# Patient Record
Sex: Female | Born: 1950 | Race: Black or African American | Hispanic: No | Smoking: Current some day smoker
Health system: Southern US, Community
[De-identification: ages and names within clinical notes are randomized; demographics above are authoritative.]

## PROBLEM LIST (undated history)

## (undated) DIAGNOSIS — I340 Nonrheumatic mitral (valve) insufficiency: Secondary | ICD-10-CM

## (undated) DIAGNOSIS — I219 Acute myocardial infarction, unspecified: Secondary | ICD-10-CM

---

## 1998-09-14 ENCOUNTER — Other Ambulatory Visit: Admission: RE | Admit: 1998-09-14 | Discharge: 1998-09-14 | Payer: Self-pay | Admitting: *Deleted

## 1998-12-23 ENCOUNTER — Other Ambulatory Visit: Admission: RE | Admit: 1998-12-23 | Discharge: 1998-12-23 | Payer: Self-pay | Admitting: *Deleted

## 1998-12-23 ENCOUNTER — Encounter (INDEPENDENT_AMBULATORY_CARE_PROVIDER_SITE_OTHER): Payer: Self-pay | Admitting: Specialist

## 2000-09-05 ENCOUNTER — Other Ambulatory Visit: Admission: RE | Admit: 2000-09-05 | Discharge: 2000-09-05 | Payer: Self-pay | Admitting: *Deleted

## 2001-01-06 ENCOUNTER — Other Ambulatory Visit: Admission: RE | Admit: 2001-01-06 | Discharge: 2001-01-06 | Payer: Self-pay | Admitting: Obstetrics and Gynecology

## 2001-01-24 ENCOUNTER — Other Ambulatory Visit: Admission: RE | Admit: 2001-01-24 | Discharge: 2001-01-24 | Payer: Self-pay | Admitting: Obstetrics and Gynecology

## 2001-02-07 ENCOUNTER — Encounter: Payer: Self-pay | Admitting: Obstetrics and Gynecology

## 2001-02-07 ENCOUNTER — Ambulatory Visit (HOSPITAL_COMMUNITY): Admission: RE | Admit: 2001-02-07 | Discharge: 2001-02-07 | Payer: Self-pay | Admitting: Obstetrics and Gynecology

## 2011-09-09 ENCOUNTER — Emergency Department (HOSPITAL_COMMUNITY): Payer: BC Managed Care – PPO

## 2011-09-09 ENCOUNTER — Encounter (HOSPITAL_COMMUNITY): Payer: Self-pay

## 2011-09-09 ENCOUNTER — Inpatient Hospital Stay (HOSPITAL_COMMUNITY)
Admission: EM | Admit: 2011-09-09 | Discharge: 2011-09-20 | DRG: 544 | Disposition: A | Discharge status: Home / Self Care | Payer: BC Managed Care – PPO | Source: Ambulatory Visit | Attending: Cardiovascular Disease | Admitting: Cardiovascular Disease

## 2011-09-09 DIAGNOSIS — E872 Acidosis, unspecified: Secondary | ICD-10-CM

## 2011-09-09 DIAGNOSIS — J9600 Acute respiratory failure, unspecified whether with hypoxia or hypercapnia: Secondary | ICD-10-CM | POA: Diagnosis present

## 2011-09-09 DIAGNOSIS — I469 Cardiac arrest, cause unspecified: Secondary | ICD-10-CM

## 2011-09-09 DIAGNOSIS — I959 Hypotension, unspecified: Secondary | ICD-10-CM | POA: Diagnosis present

## 2011-09-09 DIAGNOSIS — F17200 Nicotine dependence, unspecified, uncomplicated: Secondary | ICD-10-CM | POA: Diagnosis present

## 2011-09-09 DIAGNOSIS — I429 Cardiomyopathy, unspecified: Secondary | ICD-10-CM | POA: Diagnosis present

## 2011-09-09 DIAGNOSIS — I340 Nonrheumatic mitral (valve) insufficiency: Secondary | ICD-10-CM

## 2011-09-09 DIAGNOSIS — N289 Disorder of kidney and ureter, unspecified: Secondary | ICD-10-CM

## 2011-09-09 DIAGNOSIS — I5020 Unspecified systolic (congestive) heart failure: Secondary | ICD-10-CM | POA: Diagnosis not present

## 2011-09-09 DIAGNOSIS — I4901 Ventricular fibrillation: Principal | ICD-10-CM

## 2011-09-09 DIAGNOSIS — Z9911 Dependence on respirator [ventilator] status: Secondary | ICD-10-CM

## 2011-09-09 DIAGNOSIS — E876 Hypokalemia: Secondary | ICD-10-CM

## 2011-09-09 DIAGNOSIS — G931 Anoxic brain damage, not elsewhere classified: Secondary | ICD-10-CM | POA: Diagnosis present

## 2011-09-09 DIAGNOSIS — I059 Rheumatic mitral valve disease, unspecified: Secondary | ICD-10-CM | POA: Diagnosis present

## 2011-09-09 DIAGNOSIS — I43 Cardiomyopathy in diseases classified elsewhere: Secondary | ICD-10-CM | POA: Diagnosis present

## 2011-09-09 DIAGNOSIS — E639 Nutritional deficiency, unspecified: Secondary | ICD-10-CM | POA: Diagnosis present

## 2011-09-09 DIAGNOSIS — R0902 Hypoxemia: Secondary | ICD-10-CM

## 2011-09-09 DIAGNOSIS — I428 Other cardiomyopathies: Secondary | ICD-10-CM | POA: Diagnosis present

## 2011-09-09 DIAGNOSIS — J810 Acute pulmonary edema: Secondary | ICD-10-CM | POA: Diagnosis not present

## 2011-09-09 DIAGNOSIS — F1410 Cocaine abuse, uncomplicated: Secondary | ICD-10-CM

## 2011-09-09 DIAGNOSIS — J9601 Acute respiratory failure with hypoxia: Secondary | ICD-10-CM

## 2011-09-09 DIAGNOSIS — F1010 Alcohol abuse, uncomplicated: Secondary | ICD-10-CM | POA: Diagnosis not present

## 2011-09-09 DIAGNOSIS — N179 Acute kidney failure, unspecified: Secondary | ICD-10-CM | POA: Diagnosis not present

## 2011-09-09 HISTORY — DX: Nonrheumatic mitral (valve) insufficiency: I34.0

## 2011-09-09 LAB — POCT I-STAT, CHEM 8
BUN: 5 mg/dL — ABNORMAL LOW (ref 6–23)
Calcium, Ion: 1.03 mmol/L — ABNORMAL LOW (ref 1.12–1.32)
Chloride: 115 mEq/L — ABNORMAL HIGH (ref 96–112)
Glucose, Bld: 195 mg/dL — ABNORMAL HIGH (ref 70–99)
HCT: 41 % (ref 36.0–46.0)
HCT: 41 % (ref 36.0–46.0)
Hemoglobin: 13.9 g/dL (ref 12.0–15.0)
Hemoglobin: 12.9 g/dL (ref 12.0–15.0)
Potassium: 2.8 mEq/L — ABNORMAL LOW (ref 3.5–5.1)
Sodium: 146 mEq/L — ABNORMAL HIGH (ref 135–145)
Sodium: 146 mEq/L — ABNORMAL HIGH (ref 135–145)
Sodium: 147 mEq/L — ABNORMAL HIGH (ref 135–145)
TCO2: 18 mmol/L (ref 0–100)

## 2011-09-09 LAB — GLUCOSE, CAPILLARY
Glucose-Capillary: 207 mg/dL — ABNORMAL HIGH (ref 70–99)
Glucose-Capillary: 168 mg/dL — ABNORMAL HIGH (ref 70–99)
Glucose-Capillary: 158 mg/dL — ABNORMAL HIGH (ref 70–99)
Glucose-Capillary: 154 mg/dL — ABNORMAL HIGH (ref 70–99)
Glucose-Capillary: 136 mg/dL — ABNORMAL HIGH (ref 70–99)
Glucose-Capillary: 117 mg/dL — ABNORMAL HIGH (ref 70–99)
Glucose-Capillary: 127 mg/dL — ABNORMAL HIGH (ref 70–99)

## 2011-09-09 LAB — URINALYSIS, ROUTINE W REFLEX MICROSCOPIC
Bilirubin Urine: NEGATIVE
Protein, ur: 100 mg/dL — AB
Urobilinogen, UA: 0.2 mg/dL (ref 0.0–1.0)

## 2011-09-09 LAB — POCT I-STAT 3, ART BLOOD GAS (G3+)
Bicarbonate: 19.5 mEq/L — ABNORMAL LOW (ref 20.0–24.0)
Patient temperature: 36.5
TCO2: 21 mmol/L (ref 0–100)
pCO2 arterial: 47.9 mmHg — ABNORMAL HIGH (ref 35.0–45.0)
pH, Arterial: 7.215 — ABNORMAL LOW (ref 7.350–7.400)

## 2011-09-09 LAB — DIFFERENTIAL
Basophils Absolute: 0.1 10*3/uL (ref 0.0–0.1)
Lymphs Abs: 6.6 10*3/uL — ABNORMAL HIGH (ref 0.7–4.0)
Monocytes Relative: 4 % (ref 3–12)
Neutrophils Relative %: 29 % — ABNORMAL LOW (ref 43–77)

## 2011-09-09 LAB — MRSA PCR SCREENING: MRSA by PCR: NEGATIVE

## 2011-09-09 LAB — COMPREHENSIVE METABOLIC PANEL
ALT: 138 U/L — ABNORMAL HIGH (ref 0–35)
AST: 253 U/L — ABNORMAL HIGH (ref 0–37)
Albumin: 3.7 g/dL (ref 3.5–5.2)
Calcium: 8.5 mg/dL (ref 8.4–10.5)
Chloride: 104 mEq/L (ref 96–112)
Creatinine, Ser: 1.34 mg/dL — ABNORMAL HIGH (ref 0.50–1.10)
Sodium: 141 mEq/L (ref 135–145)
Total Bilirubin: 0.6 mg/dL (ref 0.3–1.2)

## 2011-09-09 LAB — PROTIME-INR
INR: 1 (ref 0.00–1.49)
INR: 1.17 (ref 0.00–1.49)
Prothrombin Time: 15.1 seconds (ref 11.6–15.2)

## 2011-09-09 LAB — CARDIAC PANEL(CRET KIN+CKTOT+MB+TROPI)
CK, MB: 2.1 ng/mL (ref 0.3–4.0)
CK, MB: 11.7 ng/mL (ref 0.3–4.0)
Relative Index: 1.4 (ref 0.0–2.5)
Total CK: 167 U/L (ref 7–177)
Troponin I: <0.3 ng/mL (ref <0.30)
Troponin I: 0.95 ng/mL (ref <0.30)

## 2011-09-09 LAB — BASIC METABOLIC PANEL
Calcium: 6.9 mg/dL — ABNORMAL LOW (ref 8.4–10.5)
Calcium: 7 mg/dL — ABNORMAL LOW (ref 8.4–10.5)
Chloride: 112 mEq/L (ref 96–112)
Creatinine, Ser: 0.68 mg/dL (ref 0.50–1.10)
GFR calc Af Amer: >90 mL/min (ref >90)
GFR calc non Af Amer: >90 mL/min (ref >90)
Sodium: 141 mEq/L (ref 135–145)

## 2011-09-09 LAB — LACTIC ACID, PLASMA: Lactic Acid, Venous: 7.3 mmol/L — ABNORMAL HIGH (ref 0.5–2.2)

## 2011-09-09 LAB — CBC
MCV: 102.2 fL — ABNORMAL HIGH (ref 78.0–100.0)
Platelets: 294 10*3/uL (ref 150–400)
RBC: 4.09 MIL/uL (ref 3.87–5.11)
RDW: 14 % (ref 11.5–15.5)
WBC: 10.3 10*3/uL (ref 4.0–10.5)

## 2011-09-09 LAB — RAPID URINE DRUG SCREEN, HOSP PERFORMED
Barbiturates: NOT DETECTED
Cocaine: POSITIVE — AB
Tetrahydrocannabinol: NOT DETECTED

## 2011-09-09 LAB — URINE MICROSCOPIC-ADD ON

## 2011-09-09 LAB — HEPARIN LEVEL (UNFRACTIONATED): Heparin Unfractionated: 0.49 IU/mL (ref 0.30–0.70)

## 2011-09-09 LAB — PHOSPHORUS: Phosphorus: 2.5 mg/dL (ref 2.3–4.6)

## 2011-09-09 LAB — APTT: aPTT: 26 seconds (ref 24–37)

## 2011-09-09 LAB — PROCALCITONIN: Procalcitonin: <0.1 ng/mL

## 2011-09-09 MED ORDER — MIDAZOLAM BOLUS VIA INFUSION
1.0000 mg | INTRAVENOUS | Status: DC | PRN
  Filled 2011-09-09: qty 2

## 2011-09-09 MED ORDER — FOLIC ACID 5 MG/ML IJ SOLN
1.0000 mg | INTRAMUSCULAR | Status: AC
  Administered 2011-09-09: 1 mg via INTRAVENOUS
  Filled 2011-09-09: qty 0.2

## 2011-09-09 MED ORDER — SODIUM CHLORIDE 0.9 % IV SOLN
INTRAVENOUS | Status: DC
  Administered 2011-09-09 – 2011-09-10 (×2): 10 mL/h via INTRAVENOUS
  Administered 2011-09-11: 18:00:00 via INTRAVENOUS

## 2011-09-09 MED ORDER — PROPOFOL 10 MG/ML IV EMUL
INTRAVENOUS | Status: AC
  Filled 2011-09-09: qty 20

## 2011-09-09 MED ORDER — POTASSIUM CHLORIDE 10 MEQ/100ML IV SOLN
10.0000 meq | INTRAVENOUS | Status: AC
  Administered 2011-09-09 (×4): 10 meq via INTRAVENOUS
  Filled 2011-09-09: qty 400

## 2011-09-09 MED ORDER — FENTANYL BOLUS VIA INFUSION
50.0000 ug | Freq: Four times a day (QID) | INTRAVENOUS | Status: DC | PRN
  Filled 2011-09-09: qty 100

## 2011-09-09 MED ORDER — NOREPINEPHRINE BITARTRATE 1 MG/ML IJ SOLN
0.5000 ug/min | INTRAVENOUS | Status: DC
  Administered 2011-09-09 – 2011-09-10 (×2): 5 ug/min via INTRAVENOUS
  Administered 2011-09-11: 9 ug/min via INTRAVENOUS
  Filled 2011-09-09 (×5): qty 4

## 2011-09-09 MED ORDER — ROCURONIUM BROMIDE 50 MG/5ML IV SOLN
INTRAVENOUS | Status: AC
  Filled 2011-09-09: qty 2

## 2011-09-09 MED ORDER — THIAMINE HCL 100 MG/ML IJ SOLN
100.0000 mg | Freq: Every day | INTRAMUSCULAR | Status: DC
  Administered 2011-09-10 – 2011-09-16 (×7): 100 mg via INTRAVENOUS
  Filled 2011-09-09 (×8): qty 1

## 2011-09-09 MED ORDER — CISATRACURIUM BESYLATE 2 MG/ML IV SOLN
0.1000 mg/kg | Freq: Once | INTRAVENOUS | Status: DC
  Filled 2011-09-09: qty 3.8

## 2011-09-09 MED ORDER — ARTIFICIAL TEARS OP OINT
1.0000 "application " | TOPICAL_OINTMENT | Freq: Three times a day (TID) | OPHTHALMIC | Status: DC
  Administered 2011-09-09 – 2011-09-11 (×6): 1 via OPHTHALMIC
  Filled 2011-09-09 (×3): qty 3.5

## 2011-09-09 MED ORDER — HEPARIN (PORCINE) IN NACL 100-0.45 UNIT/ML-% IJ SOLN
750.0000 [IU]/h | INTRAMUSCULAR | Status: DC
  Administered 2011-09-09 – 2011-09-10 (×2): 750 [IU]/h via INTRAVENOUS
  Filled 2011-09-09 (×3): qty 250

## 2011-09-09 MED ORDER — "THROMBI-PAD 3""X3"" EX PADS"
MEDICATED_PAD | CUTANEOUS | Status: AC
  Filled 2011-09-09: qty 1

## 2011-09-09 MED ORDER — SODIUM CHLORIDE 0.9 % IV SOLN
0.5000 ug/kg/min | INTRAVENOUS | Status: DC
  Filled 2011-09-09: qty 20

## 2011-09-09 MED ORDER — SUCCINYLCHOLINE CHLORIDE 20 MG/ML IJ SOLN
INTRAMUSCULAR | Status: AC
  Administered 2011-09-09: 12:00:00
  Filled 2011-09-09: qty 10

## 2011-09-09 MED ORDER — MIDAZOLAM HCL 5 MG/ML IJ SOLN
2.0000 mg/h | INTRAMUSCULAR | Status: DC
  Administered 2011-09-09 (×2): 4 mg/h via INTRAVENOUS
  Administered 2011-09-10 (×2): 5 mg/h via INTRAVENOUS
  Administered 2011-09-11: 7 mg/h via INTRAVENOUS
  Administered 2011-09-11: 10 mg/h via INTRAVENOUS
  Filled 2011-09-09 (×9): qty 10

## 2011-09-09 MED ORDER — DEXTROSE 10 % IV SOLN: INTRAVENOUS | Status: DC

## 2011-09-09 MED ORDER — SODIUM CHLORIDE 0.9 % IV SOLN
2000.0000 mL | Freq: Once | INTRAVENOUS | Status: AC
  Administered 2011-09-09: 2000 mL via INTRAVENOUS

## 2011-09-09 MED ORDER — EPINEPHRINE HCL 0.1 MG/ML IJ SOLN
INTRAMUSCULAR | Status: AC
  Filled 2011-09-09: qty 20

## 2011-09-09 MED ORDER — NOREPINEPHRINE BITARTRATE 1 MG/ML IJ SOLN
2.0000 ug/min | INTRAVENOUS | Status: DC
  Filled 2011-09-09: qty 4

## 2011-09-09 MED ORDER — CHLORHEXIDINE GLUCONATE 0.12 % MT SOLN
15.0000 mL | Freq: Two times a day (BID) | OROMUCOSAL | Status: DC
  Administered 2011-09-09 – 2011-09-16 (×14): 15 mL via OROMUCOSAL
  Filled 2011-09-09 (×12): qty 15

## 2011-09-09 MED ORDER — METOPROLOL TARTRATE 1 MG/ML IV SOLN: 5.0000 mg | Freq: Four times a day (QID) | INTRAVENOUS | Status: DC

## 2011-09-09 MED ORDER — SODIUM CHLORIDE 0.9 % IV SOLN
10.0000 ug/h | INTRAVENOUS | Status: AC
  Administered 2011-09-09: 25 ug/h via INTRAVENOUS
  Filled 2011-09-09: qty 50

## 2011-09-09 MED ORDER — SODIUM CHLORIDE 0.9 % IV SOLN
INTRAVENOUS | Status: DC
  Administered 2011-09-10: 10 mL/h via INTRAVENOUS
  Administered 2011-09-12: 03:00:00 via INTRAVENOUS

## 2011-09-09 MED ORDER — HEPARIN SODIUM (PORCINE) 5000 UNIT/ML IJ SOLN: 5000.0000 [IU] | Freq: Three times a day (TID) | INTRAMUSCULAR | Status: DC

## 2011-09-09 MED ORDER — MIDAZOLAM HCL 2 MG/2ML IJ SOLN
INTRAMUSCULAR | Status: AC
  Administered 2011-09-09: 2 mg
  Filled 2011-09-09: qty 2

## 2011-09-09 MED ORDER — PROPOFOL INFUSION 10 MG/ML
5.0000 ug/kg/min | INTRAVENOUS | Status: AC
  Filled 2011-09-09: qty 100

## 2011-09-09 MED ORDER — FENTANYL CITRATE 0.05 MG/ML IJ SOLN
INTRAMUSCULAR | Status: AC
  Administered 2011-09-09: 50 ug
  Filled 2011-09-09: qty 2

## 2011-09-09 MED ORDER — INSULIN ASPART 100 UNIT/ML ~~LOC~~ SOLN
0.0000 [IU] | SUBCUTANEOUS | Status: DC
  Administered 2011-09-09: 1 [IU] via SUBCUTANEOUS
  Administered 2011-09-09: 3 [IU] via SUBCUTANEOUS
  Administered 2011-09-10 – 2011-09-16 (×14): 1 [IU] via SUBCUTANEOUS

## 2011-09-09 MED ORDER — PROPOFOL 10 MG/ML IV EMUL
5.0000 ug/kg/min | Freq: Once | INTRAVENOUS | Status: AC
  Administered 2011-09-09: 5 ug/kg/min via INTRAVENOUS

## 2011-09-09 MED ORDER — HEPARIN BOLUS VIA INFUSION
2500.0000 [IU] | Freq: Once | INTRAVENOUS | Status: AC
  Administered 2011-09-09: 2500 [IU] via INTRAVENOUS
  Filled 2011-09-09: qty 2500

## 2011-09-09 MED ORDER — LIDOCAINE HCL (CARDIAC) 20 MG/ML IV SOLN
INTRAVENOUS | Status: AC
  Filled 2011-09-09: qty 5

## 2011-09-09 MED ORDER — CISATRACURIUM BOLUS VIA INFUSION
0.0500 mg/kg | Freq: Once | INTRAVENOUS | Status: AC | PRN
  Filled 2011-09-09: qty 4

## 2011-09-09 MED ORDER — SODIUM CHLORIDE 0.9 % IV SOLN
1.0000 ug/kg/min | INTRAVENOUS | Status: DC
  Administered 2011-09-09: 1 ug/kg/min via INTRAVENOUS
  Administered 2011-09-10: 1.5 ug/kg/min via INTRAVENOUS
  Filled 2011-09-09 (×2): qty 20

## 2011-09-09 MED ORDER — SODIUM CHLORIDE 0.9 % IV SOLN: 1.0000 mg | Freq: Once | INTRAVENOUS | Status: DC

## 2011-09-09 MED ORDER — CHLORHEXIDINE GLUCONATE 0.12 % MT SOLN
OROMUCOSAL | Status: AC
  Administered 2011-09-09: 15 mL via OROMUCOSAL
  Filled 2011-09-09: qty 15

## 2011-09-09 MED ORDER — ETOMIDATE 2 MG/ML IV SOLN
INTRAVENOUS | Status: AC
  Administered 2011-09-09: 200 mg
  Filled 2011-09-09: qty 20

## 2011-09-09 MED ORDER — METOPROLOL TARTRATE 1 MG/ML IV SOLN
5.0000 mg | Freq: Four times a day (QID) | INTRAVENOUS | Status: DC
  Administered 2011-09-09: 5 mg via INTRAVENOUS
  Filled 2011-09-09 (×5): qty 5

## 2011-09-09 MED ORDER — HEPARIN SODIUM (PORCINE) 5000 UNIT/ML IJ SOLN
5000.0000 [IU] | Freq: Three times a day (TID) | INTRAMUSCULAR | Status: DC
  Filled 2011-09-09: qty 1

## 2011-09-09 MED ORDER — BIOTENE DRY MOUTH MT LIQD
15.0000 mL | Freq: Four times a day (QID) | OROMUCOSAL | Status: DC
  Administered 2011-09-10 – 2011-09-17 (×32): 15 mL via OROMUCOSAL

## 2011-09-09 MED ORDER — SODIUM CHLORIDE 0.9 % IV SOLN
INTRAVENOUS | Status: DC
  Administered 2011-09-09 – 2011-09-11 (×3): via INTRAVENOUS

## 2011-09-09 MED ORDER — ASPIRIN 300 MG RE SUPP
300.0000 mg | RECTAL | Status: AC
  Administered 2011-09-09: 300 mg via RECTAL
  Filled 2011-09-09: qty 1

## 2011-09-09 MED ORDER — PANTOPRAZOLE SODIUM 40 MG IV SOLR
40.0000 mg | Freq: Every day | INTRAVENOUS | Status: DC
  Administered 2011-09-09 – 2011-09-13 (×5): 40 mg via INTRAVENOUS
  Filled 2011-09-09 (×6): qty 40

## 2011-09-09 MED ORDER — SODIUM CHLORIDE 0.9 % IV SOLN
50.0000 ug/h | INTRAVENOUS | Status: DC
  Administered 2011-09-10: 100 ug/h via INTRAVENOUS
  Administered 2011-09-11: 175 ug/h via INTRAVENOUS
  Filled 2011-09-09 (×2): qty 50

## 2011-09-09 MED ORDER — CISATRACURIUM BESYLATE 2 MG/ML IV SOLN
0.1000 mg/kg | Freq: Once | INTRAVENOUS | Status: AC | PRN
  Filled 2011-09-09: qty 3.8

## 2011-09-09 NOTE — Progress Notes (Signed)
09/09/11 1402  OTHER  CSW Follow Up Status Follow-up required

## 2011-09-09 NOTE — Progress Notes (Signed)
09/09/11 1402  Discharge Planning  Type of Residence Other (Comment) (Unknown)  Home Care Services No  Support Systems Other (Comment) (unknown)  Do you have any problems obtaining your medications? No  Expected Discharge Date 09/12/11  Case Management Consult Needed Yes (Comment)  Social Work Consult Needed Yes (Comment)

## 2011-09-09 NOTE — Progress Notes (Signed)
Chaplain collected family contact information:  Berlin, Viereck. (son--1st contact) [phone] [address], [place] [address]  Susann Givens, Sr. (ex-husband--2nd contact) [phone]  Patient's son requests that medical staff contact him once his mother has been admitted to 2900.

## 2011-09-09 NOTE — Procedures (Signed)
Central Venous Catheter Insertion Procedure Note AVAIAH STEMPEL [id number] 1951/01/07  Procedure: Insertion of Central Venous Catheter Indications: Drug and/or fluid administration  Procedure Details Consent: Unable to obtain consent because of emergent medical necessity. Time Out: Verified patient identification, verified procedure, site/side was marked, verified correct patient position, special equipment/implants available, medications/allergies/relevent history reviewed, required imaging and test results available.  Performed  Maximum sterile technique was used including antiseptics, cap, gloves, gown, hand hygiene, mask and sheet. Skin prep: Chlorhexidine; local anesthetic administered A antimicrobial bonded/coated triple lumen catheter was placed in the left internal jugular vein using the Seldinger technique. Ultrasound guidance used.yes Catheter placed to 20 cm. Blood aspirated via all 3 ports and then flushed x 3. Line sutured x 2 and dressing applied.  Evaluation Blood flow good Complications: No apparent complications Patient did tolerate procedure well. Chest X-ray ordered to verify placement.  CXR: pending.  Brett Canales Minor ACNP [facility] PCCM Pager [phone] till 3 pm If no answer page [phone] 09/09/2011, 1:00 PM   I was present for and supervised the entire procedure  Billy Fischer, MD;  PCCM service; Mobile [phone]

## 2011-09-09 NOTE — Consult Note (Signed)
Reason for Consult: Cardiac Arrest on [facility] Protocol  Referring Physician:   [name] is an 61 y.o. female.  HPI: The patient is an obese AA female with a history of tobacco abuse smokes a pack every two weeks.  Otherwise healthy.  Takes no medications according to the family.  She is recently retired from [employer].  The family was with her during the arrest.  They stated she said, "I feel Dizzy"  Then fell to the floor and became rigid-like.  EMS found her in VFib and shocked her twice with epi x2 and chest compressions.  Initial cardiac enzymes are negative.  History reviewed. No pertinent past medical history.  History reviewed. No pertinent past surgical history.  No family history on file.  Social History:  does not have a smoking history on file. She does not have any smokeless tobacco history on file. Her alcohol and drug histories not on file.  Allergies: No Known Allergies  Medications: None  Results for orders placed during the hospital encounter of 09/09/11 (from the past 48 hour(s))  CBC     Status: Abnormal   Collection Time   09/09/11 12:13 PM      Component Value Range Comment   WBC 10.3  4.0 - 10.5 (K/uL)    RBC 4.09  3.87 - 5.11 (MIL/uL)    Hemoglobin 13.8  12.0 - 15.0 (g/dL)    HCT 45.4  09.8 - 11.9 (%)    MCV 102.2 (*) 78.0 - 100.0 (fL)    MCH 33.7  26.0 - 34.0 (pg)    MCHC 33.0  30.0 - 36.0 (g/dL)    RDW 14.7  82.9 - 56.2 (%)    Platelets 294  150 - 400 (K/uL)   DIFFERENTIAL     Status: Abnormal   Collection Time   09/09/11 12:13 PM      Component Value Range Comment   Neutrophils Relative 29 (*) 43 - 77 (%)    Lymphocytes Relative 64 (*) 12 - 46 (%)    Monocytes Relative 4  3 - 12 (%)    Eosinophils Relative 2  0 - 5 (%)    Basophils Relative 1  0 - 1 (%)    Neutro Abs 3.0  1.7 - 7.7 (K/uL)    Lymphs Abs 6.6 (*) 0.7 - 4.0 (K/uL)    Monocytes Absolute 0.4  0.1 - 1.0 (K/uL)    Eosinophils Absolute 0.2  0.0 - 0.7 (K/uL)    Basophils Absolute  0.1  0.0 - 0.1 (K/uL)    Smear Review PLATELET CLUMPS NOTED ON SMEAR     COMPREHENSIVE METABOLIC PANEL     Status: Abnormal   Collection Time   09/09/11 12:13 PM      Component Value Range Comment   Sodium 141  135 - 145 (mEq/L)    Potassium 3.3 (*) 3.5 - 5.1 (mEq/L)    Chloride 104  96 - 112 (mEq/L)    CO2 18 (*) 19 - 32 (mEq/L)    Glucose, Bld 267 (*) 70 - 99 (mg/dL)    BUN 9  6 - 23 (mg/dL)    Creatinine, Ser 1.30 (*) 0.50 - 1.10 (mg/dL)    Calcium 8.5  8.4 - 10.5 (mg/dL)    Total Protein 7.2  6.0 - 8.3 (g/dL)    Albumin 3.7  3.5 - 5.2 (g/dL)    AST 865 (*) 0 - 37 (U/L)    ALT 138 (*) 0 - 35 (  U/L)    Alkaline Phosphatase 119 (*) 39 - 117 (U/L)    Total Bilirubin 0.6  0.3 - 1.2 (mg/dL)    GFR calc non Af Amer 42 (*) >90 (mL/min)    GFR calc Af Amer 49 (*) >90 (mL/min)   PROTIME-INR     Status: Normal   Collection Time   09/09/11 12:13 PM      Component Value Range Comment   Prothrombin Time 13.4  11.6 - 15.2 (seconds)    INR 1.00  0.00 - 1.49    APTT     Status: Normal   Collection Time   09/09/11 12:13 PM      Component Value Range Comment   aPTT 26  24 - 37 (seconds)   CARDIAC PANEL(CRET KIN+CKTOT+MB+TROPI)     Status: Normal   Collection Time   09/09/11 12:13 PM      Component Value Range Comment   Total CK 160  7 - 177 (U/L)    CK, MB 2.1  0.3 - 4.0 (ng/mL)    Troponin I <0.30  <0.30 (ng/mL)    Relative Index 1.3  0.0 - 2.5    LACTIC ACID, PLASMA     Status: Abnormal   Collection Time   09/09/11 12:14 PM      Component Value Range Comment   Lactic Acid, Venous 7.3 (*) 0.5 - 2.2 (mmol/L)   URINALYSIS, ROUTINE W REFLEX MICROSCOPIC     Status: Abnormal   Collection Time   09/09/11 12:54 PM      Component Value Range Comment   Color, Urine YELLOW  YELLOW     APPearance CLEAR  CLEAR     Specific Gravity, Urine 1.012  1.005 - 1.030     pH 6.0  5.0 - 8.0     Glucose, UA 500 (*) NEGATIVE (mg/dL)    Hgb urine dipstick SMALL (*) NEGATIVE     Bilirubin Urine NEGATIVE   NEGATIVE     Ketones, ur NEGATIVE  NEGATIVE (mg/dL)    Protein, ur 098 (*) NEGATIVE (mg/dL)    Urobilinogen, UA 0.2  0.0 - 1.0 (mg/dL)    Nitrite NEGATIVE  NEGATIVE     Leukocytes, UA NEGATIVE  NEGATIVE    URINE MICROSCOPIC-ADD ON     Status: Normal   Collection Time   09/09/11 12:54 PM      Component Value Range Comment   Squamous Epithelial / LPF RARE  RARE     RBC / HPF 0-2  <3 (RBC/hpf)     Dg Chest Portable 1 View  09/09/2011  *RADIOLOGY REPORT*  Clinical Data: Central line placement.  PORTABLE CHEST - 1 VIEW  Comparison: 09/09/2011  Findings: Patient has left-sided IJ central line, tip to the superior vena cava.  Endotracheal tube is in place, tip to the lower trachea, 3.6 cm above carina.  Heart is mildly enlarged. There are patchy infiltrates involving the upper lobes bilaterally, probably not significantly changed accounting for differences in technique.  IMPRESSION:  1.  Interval placement of left IJ central line.  No evidence for pneumothorax. 2.  Persistent patchy infiltrates.  Original Report Authenticated By: Patterson Hammersmith, M.D.   Dg Chest Portable 1 View  09/09/2011  *RADIOLOGY REPORT*  Clinical Data: Post cardiac arrest, endotracheal tube placement  PORTABLE CHEST - 1 VIEW  Comparison: None.  Findings: Enlarged cardiac silhouette.  Normal mediastinal contours given decreased lung volumes and supine patient positioning. Endotracheal tube overlies tracheal air column with tip approximately  1.1 cm from the carina. No supine evidence of pneumothorax or pleural effusion.  Minimal bilateral perihilar heterogeneous opacities, possibly atelectasis.  No acute osseous abnormalities.  IMPRESSION: 1.  Endotracheal tube overlies the tracheal air column with tip approximately 1.1 cm from the carina.  Retraction approximately 3 cm is recommended. No pneumothorax. 2.  Decreased lung volumes with perihilar opacities favored to represent atelectasis.  Original Report Authenticated By: Waynard Reeds, M.D.    Review of Systems  Unable to perform ROS  Blood pressure 225/133, pulse 136, resp. rate 18, height 5\' 8"  (1.727 m), weight 75 kg (165 lb 5.5 oz), SpO2 90.00%. Physical Exam  Constitutional:       [facility] protocol, Sedated, intubated,  Cardiovascular: Regular rhythm.  Tachycardia present.   No murmur heard. Pulses:      Radial pulses are 2+ on the right side, and 2+ on the left side.       Dorsalis pedis pulses are 2+ on the right side, and 2+ on the left side.  Respiratory:       Intubated on Ventilator.  GI: Bowel sounds are normal.  Musculoskeletal: She exhibits no edema.       No LEE   Neurological:       Paralyzed for intubation.    Skin: Skin is dry.    Assessment/Plan: Patient Active Hospital Problem List: 1.  Vfib Cardiac Arrest. 2. Tobacco abuse.  Plan:  Stat Echo, Cycle cardiac enzymes.  MD opinion to follow.     HAGER,BRYAN W 09/09/2011, 1:55 PM   Patient seen and examined. Agree with assessment and plan.  Pt currently on Artic Sun hypothermia protocol for Out of hospital cardiac arrest.  Suspect arrhytmic etiology with VF documented by EMS. No antecedent history of known CAD.  ECG is without acute ST changes but suggests LVH with repolarization. Pt is currently sedated and on paralytic meds on ventilation.  Will cycle enzymes, obtain echo and f/u lab including LFT's which are elevated.  Ultimately will need cardiac cath/ischemic assessment.  Lennette Bihari, MD, [facility] 09/09/2011 8:04 PM

## 2011-09-09 NOTE — Progress Notes (Signed)
Chaplain responded to a page to provide support to the son of a post-CPR/arctic sun patient. Chaplain listened and provided emotional support. Additional family members/friends arrived to support the patient and her son. Follow up as needed.

## 2011-09-09 NOTE — ED Notes (Signed)
Contact: Susann Givens, Montez Hageman. (1st contact) son - [phone] - [address] [place], [place] [address]  Susann Givens, Sr.  (2nd contact): ex- husband [phone]

## 2011-09-09 NOTE — Progress Notes (Signed)
ANTICOAGULATION CONSULT NOTE - Initial Consult  Pharmacy Consult for heparin Indication: chest pain/ACS/hypothermia protocol  No Known Allergies  Patient Measurements: Height: 5\' 8"  (172.7 cm) Weight: 165 lb 5.5 oz (75 kg) IBW/kg (Calculated) : 63.9  Heparin Dosing Weight: 75kg  Vital Signs: Temp: 91.8 F (33.2 C) (04/21 2200) Temp src: Core (Comment) (04/21 2200) BP: 114/89 mmHg (04/21 2200) Pulse Rate: 70  (04/21 2200)  Labs:  Basename 09/09/11 2130 09/09/11 1940 09/09/11 1813 09/09/11 1609 09/09/11 1528 09/09/11 1354 09/09/11 1213  HGB -- -- 12.9 13.9 -- -- --  HCT -- -- 38.0 41.0 41.0 -- --  PLT -- -- -- -- -- -- 294  APTT -- 97* -- -- -- -- 26  LABPROT -- 15.1 -- -- -- -- 13.4  INR -- 1.17 -- -- -- -- 1.00  HEPARINUNFRC 0.49 -- -- -- -- -- --  CREATININE -- 0.68 0.70 0.80 -- -- --  CKTOTAL -- -- -- -- -- 167 160  CKMB -- -- -- -- -- 2.3 2.1  TROPONINI -- -- -- -- -- <0.30 <0.30   Estimated Creatinine Clearance: 76.4 ml/min (by C-G formula based on Cr of 0.68).  Medical History: History reviewed. No pertinent past medical history.  Medications:   Assessment: 22 YOF who complained of chest pain and then went unresponsive, suspected ACS with initiation of hypothermia protocol.  Heparin level therapeutic. No bleeding noted.  Goal of Therapy:  Heparin level 0.3-0.7 units/ml   Plan:  1. Continue heparin gtt at 750 units/hr.  2. F/u heparin level and CBC in a.m.   Christoper Fabian, PharmD, BCPS Clinical pharmacist, pager [phone] 09/09/2011,10:06 PM

## 2011-09-09 NOTE — ED Notes (Signed)
Pt was brought in by EMS post CPR, pt noted to be awake but coughing a lot with respiratory secretions noted. Pt was suctioned. Pt unable to give staff of history. Pt was attached to the cardiac monitor. Dr. Preston Fleeting is at the bedside with RT and nursing staff

## 2011-09-09 NOTE — Plan of Care (Signed)
Problem: Phase I Progression Outcomes Goal: Cool target temp reached 2 hrs from start Outcome: Completed/Met Date Met:  09/09/11 Pt target temp reached at 1843, total 4L cold NS given while on Arctic Sun machine to achieve target temp, per MD orders

## 2011-09-09 NOTE — Progress Notes (Signed)
Chaplain telephoned patient's son to let him know that his mom had been moved to 2908. Follow up as needed.

## 2011-09-09 NOTE — Progress Notes (Signed)
CRITICAL VALUE ALERT  Critical value received:  CKMB 11.7 Trop 0.95  Date of notification:  09/09/2011  Time of notification:  2217  Critical value read back:yes  Nurse who received alert:  Dennys Guin, L[place]le [place]te  MD notified (1st page):  Dr Michele Rockers  Time of first page:  2217  Responding MD:  Dr Michele Rockers  Time MD responded:  2218  No new orders received, cont to monitor pt

## 2011-09-09 NOTE — ED Notes (Signed)
Dr. Simonds at the bedside.  

## 2011-09-09 NOTE — ED Notes (Signed)
Pt was brought in by ambulance post CPR. EMS claimed that according to the patient complained of chest pain and went unresponsive. According to EMS , patient was on full arrest., CPR was started, 2epi was given and patient was defribrillated 4 x. Pt awake on arrival not verbally responsive and coughing a lot.

## 2011-09-09 NOTE — Progress Notes (Signed)
ANTICOAGULATION CONSULT NOTE - Initial Consult  Pharmacy Consult for heparin Indication: chest pain/ACS/hypothermia protocol  No Known Allergies  Patient Measurements: Height: 5\' 8"  (172.7 cm) Weight: 165 lb 5.5 oz (75 kg) IBW/kg (Calculated) : 63.9  Heparin Dosing Weight: 75kg  Vital Signs: BP: 149/77 mmHg (04/21 1408) Pulse Rate: 96  (04/21 1408)  Labs:  Basename 09/09/11 1213  HGB 13.8  HCT 41.8  PLT 294  APTT 26  LABPROT 13.4  INR 1.00  HEPARINUNFRC --  CREATININE 1.34*  CKTOTAL 160  CKMB 2.1  TROPONINI <0.30   Estimated Creatinine Clearance: 45.6 ml/min (by C-G formula based on Cr of 1.34).  Medical History: History reviewed. No pertinent past medical history.  Medications:   Assessment: 16 YOF who complained of chest pain and then went unresponsive, suspected ACS with initiation of hypothermia protocol.  Orders to start heparin.   Goal of Therapy:  Heparin level 0.3-0.7 units/ml   Plan:  1. Heparin bolus 2500 units then heparin gtt at 750 units/hr.  2. F/u Heparin level in 6hr 3. Daily CBC and heparin level 4. Orders for pharmacy to renally adjust antibiotics, no antibiotics currently ordered.   Dannielle Huh 09/09/2011,2:34 PM

## 2011-09-09 NOTE — ED Provider Notes (Addendum)
History     CSN: [id number]  Arrival date & time 09/09/11  1153   First MD Initiated Contact with Patient 09/09/11 1201      Chief Complaint  Patient presents with  . Cardiac Arrest    (Consider location/radiation/quality/duration/timing/severity/associated sxs/prior treatment) The history is provided by the EMS personnel. The history is limited by the condition of the patient (Altered mental status).  61 year-old female who arrived by ambulance after suffering an out of hospital cardiac arrest. This was a witnessed arrest and CPR was started fairly promptly. Initial rhythm noted on monitor by EMS was ventricular fibrillation. She was given epinephrine and defibrillated to a stable rhythm but has not woken up. She's become agitated. His EMS states that she probably should not go more than 1 or 2 minutes without CPR and they have been performing CPR for about 10 minutes of prior to successful conversion and an additional 10 minutes to transport to the emergency department.  History reviewed. No pertinent past medical history.  History reviewed. No pertinent past surgical history.  No family history on file.  History  Substance Use Topics  . Smoking status: Not on file  . Smokeless tobacco: Not on file  . Alcohol Use:     OB History    Grav Para Term Preterm Abortions TAB SAB Ect Mult Living                  Review of Systems  Unable to perform ROS: Mental status change    Allergies  Review of patient's allergies indicates no known allergies.  Home Medications  No current outpatient prescriptions on file.  BP 215/120  Pulse 96  Resp 18  Ht 5\' 8"  (1.727 m)  Wt 165 lb 5.5 oz (75 kg)  BMI 25.14 kg/m2  SpO2 96%  Physical Exam  Nursing note and vitals reviewed.  61 year old female who is agitated but in no acute distress. Vital signs are significant for hypertension with blood pressure 140/112. Oxygen saturation is 100% which is normal. It is normocephalic  atraumatic. Pupils are 3 mm and reactive. She does not cooperate for EOM exam but no gross abnormalities and EOM is noted. There. Neck is nontender and supple without bruit. Lungs are clear without rales, wheezes, or rhonchi. Heart has regular rate and rhythm without murmur. Abdomen is soft, flat, nontender without masses or hepatosplenomegaly. There is moderate chest wall tenderness. Extremities have no cyanosis or edema and no evidence of trauma. Skin is warm and dry without rash. Neurologic: She is awake with spontaneous respirations but does not respond to voice or follow commands. She does respond to pain with purposeful movement.  ED Course  INTUBATION Date/Time: 09/09/2011 12:00 PM Performed by: Dione Booze Authorized by: Preston Fleeting, Lequita Meadowcroft Consent: Verbal consent not obtained. Written consent not obtained. The procedure was performed in an emergent situation. Required items: required blood products, implants, devices, and special equipment available Patient identity confirmed: arm band and hospital-assigned identification number Time out: Immediately prior to procedure a "time out" was called to verify the correct patient, procedure, equipment, support staff and site/side marked as required. Indications: airway protection Intubation method: direct Patient status: paralyzed (RSI) Preoxygenation: nonrebreather mask and BVM Pretreatment medications: none Sedatives: etomidate Paralytic: succinylcholine Laryngoscope size: Miller 3 Tube size: 7.5 mm Tube type: cuffed Number of attempts: 1 Cricoid pressure: yes Cords visualized: yes Post-procedure assessment: chest rise and CO2 detector Breath sounds: equal ETT to lip: 23 cm Tube secured with: ETT holder Chest x-ray  interpreted by radiologist. Chest x-ray findings: endotracheal tube in appropriate position Patient tolerance: Patient tolerated the procedure well with no immediate complications.   (including critical care time)  Results  for orders placed during the hospital encounter of 09/09/11  CBC      Component Value Range   WBC 10.3  4.0 - 10.5 (K/uL)   RBC 4.09  3.87 - 5.11 (MIL/uL)   Hemoglobin 13.8  12.0 - 15.0 (g/dL)   HCT 14.7  82.9 - 56.2 (%)   MCV 102.2 (*) 78.0 - 100.0 (fL)   MCH 33.7  26.0 - 34.0 (pg)   MCHC 33.0  30.0 - 36.0 (g/dL)   RDW 13.0  86.5 - 78.4 (%)   Platelets 294  150 - 400 (K/uL)  DIFFERENTIAL      Component Value Range   Neutrophils Relative 29 (*) 43 - 77 (%)   Lymphocytes Relative 64 (*) 12 - 46 (%)   Monocytes Relative 4  3 - 12 (%)   Eosinophils Relative 2  0 - 5 (%)   Basophils Relative 1  0 - 1 (%)   Neutro Abs 3.0  1.7 - 7.7 (K/uL)   Lymphs Abs 6.6 (*) 0.7 - 4.0 (K/uL)   Monocytes Absolute 0.4  0.1 - 1.0 (K/uL)   Eosinophils Absolute 0.2  0.0 - 0.7 (K/uL)   Basophils Absolute 0.1  0.0 - 0.1 (K/uL)   Smear Review PLATELET CLUMPS NOTED ON SMEAR    COMPREHENSIVE METABOLIC PANEL      Component Value Range   Sodium 141  135 - 145 (mEq/L)   Potassium 3.3 (*) 3.5 - 5.1 (mEq/L)   Chloride 104  96 - 112 (mEq/L)   CO2 18 (*) 19 - 32 (mEq/L)   Glucose, Bld 267 (*) 70 - 99 (mg/dL)   BUN 9  6 - 23 (mg/dL)   Creatinine, Ser 6.96 (*) 0.50 - 1.10 (mg/dL)   Calcium 8.5  8.4 - 29.5 (mg/dL)   Total Protein 7.2  6.0 - 8.3 (g/dL)   Albumin 3.7  3.5 - 5.2 (g/dL)   AST 284 (*) 0 - 37 (U/L)   ALT 138 (*) 0 - 35 (U/L)   Alkaline Phosphatase 119 (*) 39 - 117 (U/L)   Total Bilirubin 0.6  0.3 - 1.2 (mg/dL)   GFR calc non Af Amer 42 (*) >90 (mL/min)   GFR calc Af Amer 49 (*) >90 (mL/min)  PROTIME-INR      Component Value Range   Prothrombin Time 13.4  11.6 - 15.2 (seconds)   INR 1.00  0.00 - 1.49   APTT      Component Value Range   aPTT 26  24 - 37 (seconds)  CARDIAC PANEL(CRET KIN+CKTOT+MB+TROPI)      Component Value Range   Total CK 160  7 - 177 (U/L)   CK, MB 2.1  0.3 - 4.0 (ng/mL)   Troponin I <0.30  <0.30 (ng/mL)   Relative Index 1.3  0.0 - 2.5   URINALYSIS, ROUTINE W REFLEX  MICROSCOPIC      Component Value Range   Color, Urine YELLOW  YELLOW    APPearance CLEAR  CLEAR    Specific Gravity, Urine 1.012  1.005 - 1.030    pH 6.0  5.0 - 8.0    Glucose, UA 500 (*) NEGATIVE (mg/dL)   Hgb urine dipstick SMALL (*) NEGATIVE    Bilirubin Urine NEGATIVE  NEGATIVE    Ketones, ur NEGATIVE  NEGATIVE (mg/dL)   Protein,  ur 100 (*) NEGATIVE (mg/dL)   Urobilinogen, UA 0.2  0.0 - 1.0 (mg/dL)   Nitrite NEGATIVE  NEGATIVE    Leukocytes, UA NEGATIVE  NEGATIVE   LACTIC ACID, PLASMA      Component Value Range   Lactic Acid, Venous 7.3 (*) 0.5 - 2.2 (mmol/L)  URINE MICROSCOPIC-ADD ON      Component Value Range   Squamous Epithelial / LPF RARE  RARE    RBC / HPF 0-2  <3 (RBC/hpf)  MAGNESIUM      Component Value Range   Magnesium 1.3 (*) 1.5 - 2.5 (mg/dL)  PROCALCITONIN      Component Value Range   Procalcitonin <0.10    PHOSPHORUS      Component Value Range   Phosphorus 2.5  2.3 - 4.6 (mg/dL)  CARDIAC PANEL(CRET KIN+CKTOT+MB+TROPI)      Component Value Range   Total CK 167  7 - 177 (U/L)   CK, MB 2.3  0.3 - 4.0 (ng/mL)   Troponin I <0.30  <0.30 (ng/mL)   Relative Index 1.4  0.0 - 2.5   POCT I-STAT 3, BLOOD GAS (G3+)      Component Value Range   pH, Arterial 7.215 (*) 7.350 - 7.400    pCO2 arterial 47.9 (*) 35.0 - 45.0 (mmHg)   pO2, Arterial 82.0  80.0 - 100.0 (mmHg)   Bicarbonate 19.5 (*) 20.0 - 24.0 (mEq/L)   TCO2 21  0 - 100 (mmol/L)   O2 Saturation 94.0     Acid-base deficit 8.0 (*) 0.0 - 2.0 (mmol/L)   Patient temperature 36.5 C     Collection site BRACHIAL ARTERY     Drawn by Operator     Sample type ARTERIAL     Ct Head Wo Contrast  09/09/2011  *RADIOLOGY REPORT*  Clinical Data: Cardiac arrest.  Ventilated.  CT HEAD WITHOUT CONTRAST  Technique:  Contiguous axial images were obtained from the base of the skull through the vertex without contrast.  Comparison: None.  Findings: Bone windows demonstrate no significant soft tissue swelling.  Mild hyperostosis  frontalis interna. Clear paranasal sinuses and mastoid air cells.  Soft tissue windows demonstrate physiologic calcifications within the basal ganglia.  Suspect mild small vessel ischemic change, primarily adjacent the posterior horns of the lateral ventricles. No  mass lesion, hemorrhage, hydrocephalus, acute infarct, intra- axial, or extra-axial fluid collection.  IMPRESSION:  1. No acute intracranial abnormality. 2.  Suspect mild small vessel ischemic change.  Original Report Authenticated By: Consuello Bossier, M.D.   Dg Chest Portable 1 View  09/09/2011  *RADIOLOGY REPORT*  Clinical Data: Central line placement.  PORTABLE CHEST - 1 VIEW  Comparison: 09/09/2011  Findings: Patient has left-sided IJ central line, tip to the superior vena cava.  Endotracheal tube is in place, tip to the lower trachea, 3.6 cm above carina.  Heart is mildly enlarged. There are patchy infiltrates involving the upper lobes bilaterally, probably not significantly changed accounting for differences in technique.  IMPRESSION:  1.  Interval placement of left IJ central line.  No evidence for pneumothorax. 2.  Persistent patchy infiltrates.  Original Report Authenticated By: Patterson Hammersmith, M.D.   Dg Chest Portable 1 View  09/09/2011  *RADIOLOGY REPORT*  Clinical Data: Post cardiac arrest, endotracheal tube placement  PORTABLE CHEST - 1 VIEW  Comparison: None.  Findings: Enlarged cardiac silhouette.  Normal mediastinal contours given decreased lung volumes and supine patient positioning. Endotracheal tube overlies tracheal air column with tip approximately 1.1 cm  from the carina. No supine evidence of pneumothorax or pleural effusion.  Minimal bilateral perihilar heterogeneous opacities, possibly atelectasis.  No acute osseous abnormalities.  IMPRESSION: 1.  Endotracheal tube overlies the tracheal air column with tip approximately 1.1 cm from the carina.  Retraction approximately 3 cm is recommended. No pneumothorax. 2.  Decreased  lung volumes with perihilar opacities favored to represent atelectasis.  Original Report Authenticated By: Waynard Reeds, M.D.    Date: 09/09/2011  Rate: 111  Rhythm: sinus tachycardia  QRS Axis: normal  Intervals: normal  ST/T Wave abnormalities: ST depressions laterally  Conduction Disutrbances:none  Narrative Interpretation: Probable LVH with repolarization abnormality versus anterolateral ischemia. Questionable minimal ST elevation in V1 and V2, insufficient to activate code STEMI.  Old EKG Reviewed: none available  Observed in the emergency department to see if her mental state would improve from but she continued to be agitated and not was felt that she would benefit from therapeutic hypothermia. She was intubated at after RSI in place and the ventilator and started on a Diprivan drip. Dr. Sung Amabile consulted to admit the patient. Dr. Tresa Endo of radiology was consulted as well. He has arrived and has been advised of  1. Cardiac arrest   2. Renal insufficiency   3. Lactic acidosis    CRITICAL CARE Performed by: ZOXWR,UEAVW   Total critical care time: 75 minutes  Critical care time was exclusive of separately billable procedures and treating other patients.  Critical care was necessary to treat or prevent imminent or life-threatening deterioration.  Critical care was time spent personally by me on the following activities: development of treatment plan with patient and/or surrogate as well as nursing, discussions with consultants, evaluation of patient's response to treatment, examination of patient, obtaining history from patient or surrogate, ordering and performing treatments and interventions, ordering and review of laboratory studies, ordering and review of radiographic studies, pulse oximetry and re-evaluation of patient's condition.    MDM  Out of hospital cardiac arrest with successful resuscitation but appeared mental status. And on therapeutic hypothermia  protocol.        Dione Booze, MD 09/09/11 0981  Dione Booze, MD 09/09/11 [phone]

## 2011-09-09 NOTE — H&P (Signed)
Name: Dawn Cruz MRN: [id number] DOB: 01-27-1951    LOS: 0 Requesting MD:  Preston Fleeting Regarding: VF arrest  ADMIT NOTE  History of Present Illness: 61 yo aaf who complained of chest pain and was witnessed to pass out. EMS response time 10 minutes. Found to be in Vfib, shocked x 2, epi x 2 and brief chest compression. Transported to ED, intubated, coughing on vent prior to sedation and NMB for hypothermia protocol. Cards has not seen as of yet(in cath lab with STEMI). PCCM to admit.  Lines / Drains: 4/21 ott>> 4/21 lt i j cvl>> 4/21 a line>>  Cultures: none  Antibiotics: none  Tests / Events: 4/21 v fib arrest  Subjective:  History reviewed. No pertinent past medical history. History reviewed. No pertinent past surgical history. Prior to Admission medications   Not on File   Allergies No Known Allergies  Family History No family history on file.  Social History  does not have a smoking history on file. She does not have any smokeless tobacco history on file. Her alcohol and drug histories not on file.  Review Of Systems  na  Vital Signs: Pulse Rate:  [86-102] 95  (04/21 1245) Resp:  [18-21] 18  (04/21 1245) BP: (128-150)/(79-112) 128/79 mmHg (04/21 1245) SpO2:  [93 %-100 %] 98 % (04/21 1245) FiO2 (%):  [100 %] 100 % (04/21 1212) Weight:  [165 lb 5.5 oz (75 kg)] 165 lb 5.5 oz (75 kg) (04/21 1212)    Physical Examination: General:  WDAAFon vent Neuro:  NMB on board   HEENT:  Lt ij cvl/ PERL@3mm  Cardiovascular: hsr rrr Lungs: coarse ronchi bilat Abdomen:  +bs Musculoskeletal:  intact Skin:  warm  Ventilator settings: Vent Mode:  [-] PRVC FiO2 (%):  [100 %] 100 % Set Rate:  [18 bmp] 18 bmp Vt Set:  [500 mL] 500 mL PEEP:  [5 cmH20] 5 cmH20  Labs and Imaging:   BMET    Component Value Date/Time   NA 147* 09/09/2011 1813   K 3.6 09/09/2011 1813   CL 117* 09/09/2011 1813   CO2 18* 09/09/2011 1213   GLUCOSE 157* 09/09/2011 1813   BUN 5* 09/09/2011 1813   CREATININE 0.70 09/09/2011 1813   CALCIUM 8.5 09/09/2011 1213   GFRNONAA 42* 09/09/2011 1213   GFRAA 49* 09/09/2011 1213    Lab 09/09/11 1213  HGB 13.8  HCT 41.8  WBC 10.3  PLT 294  CXR: CM, edema pattern  EKG: sinus tach with ant-lateral ST depression vs repol changes  Assessment and Plan: VDRF secondary to chest pain, VF arrest, 10 minutes down time prior to cpr/shock/EPI for Vfib arrest. -hypothemria protocol -cards consult -? Cath lab -sedation protocol   Brett Canales Minor ACNP Adolph Pollack PCCM Pager [phone] till 3 pm If no answer page [phone] 09/09/2011, 1:02 PM   Seen with resident MD or  ACNP above.  Pt examined and database reviewed. I agree with above findings, assessment and plan as reflected in the note above. 60 mins CCM time. Family updated in detail. Await Cards input  Billy Fischer, MD;  PCCM service; Mobile [phone]

## 2011-09-09 NOTE — Procedures (Signed)
Arterial Catheter Insertion Procedure Note Dawn Cruz [id number] 06/11/1950  Procedure: Insertion of Arterial Catheter  Indications: Blood pressure monitoring  Procedure Details Consent: Risks of procedure as well as the alternatives and risks of each were explained to the (patient/caregiver).  Consent for procedure obtained. Time Out: Verified patient identification, verified procedure, site/side was marked, verified correct patient position, special equipment/implants available, medications/allergies/relevent history reviewed, required imaging and test results available.  Performed  Maximum sterile technique was used including antiseptics, cap, gloves, gown, hand hygiene, mask and sheet. Skin prep: Chlorhexidine; local anesthetic administered 20 gauge catheter was inserted into right femoral artery using the Seldinger technique.  Evaluation Blood flow good; BP tracing good. Complications: No apparent complications.   Brett Canales Minor ACNP Adolph Pollack PCCM Pager [phone] till 3 pm If no answer page [phone] 09/09/2011, 1:54 PM   I was present for and supervised the entire procedure  Billy Fischer, MD;  PCCM service; Mobile [phone]

## 2011-09-10 ENCOUNTER — Encounter: Payer: Self-pay | Admitting: Internal Medicine

## 2011-09-10 ENCOUNTER — Inpatient Hospital Stay (HOSPITAL_COMMUNITY): Payer: BC Managed Care – PPO

## 2011-09-10 DIAGNOSIS — E872 Acidosis: Secondary | ICD-10-CM

## 2011-09-10 LAB — BASIC METABOLIC PANEL
BUN: 5 mg/dL — ABNORMAL LOW (ref 6–23)
BUN: 5 mg/dL — ABNORMAL LOW (ref 6–23)
CO2: 17 mEq/L — ABNORMAL LOW (ref 19–32)
CO2: 20 mEq/L (ref 19–32)
CO2: 18 mEq/L — ABNORMAL LOW (ref 19–32)
CO2: 18 mEq/L — ABNORMAL LOW (ref 19–32)
Calcium: 7.5 mg/dL — ABNORMAL LOW (ref 8.4–10.5)
Chloride: 111 mEq/L (ref 96–112)
Chloride: 111 mEq/L (ref 96–112)
Chloride: 112 mEq/L (ref 96–112)
Chloride: 112 mEq/L (ref 96–112)
Chloride: 113 mEq/L — ABNORMAL HIGH (ref 96–112)
Chloride: 114 mEq/L — ABNORMAL HIGH (ref 96–112)
Creatinine, Ser: 0.57 mg/dL (ref 0.50–1.10)
Creatinine, Ser: 0.56 mg/dL (ref 0.50–1.10)
Creatinine, Ser: 0.65 mg/dL (ref 0.50–1.10)
GFR calc Af Amer: >90 mL/min (ref >90)
GFR calc Af Amer: >90 mL/min (ref >90)
GFR calc Af Amer: >90 mL/min (ref >90)
GFR calc non Af Amer: >90 mL/min (ref >90)
GFR calc non Af Amer: >90 mL/min (ref >90)
GFR calc non Af Amer: >90 mL/min (ref >90)
Glucose, Bld: 112 mg/dL — ABNORMAL HIGH (ref 70–99)
Glucose, Bld: 111 mg/dL — ABNORMAL HIGH (ref 70–99)
Glucose, Bld: 114 mg/dL — ABNORMAL HIGH (ref 70–99)
Glucose, Bld: 112 mg/dL — ABNORMAL HIGH (ref 70–99)
Potassium: 3 mEq/L — ABNORMAL LOW (ref 3.5–5.1)
Potassium: 3.3 mEq/L — ABNORMAL LOW (ref 3.5–5.1)
Potassium: 3.8 mEq/L (ref 3.5–5.1)
Potassium: 4.5 mEq/L (ref 3.5–5.1)
Potassium: 5 mEq/L (ref 3.5–5.1)
Potassium: 5.3 mEq/L — ABNORMAL HIGH (ref 3.5–5.1)
Sodium: 140 mEq/L (ref 135–145)
Sodium: 140 mEq/L (ref 135–145)
Sodium: 140 mEq/L (ref 135–145)
Sodium: 141 mEq/L (ref 135–145)
Sodium: 143 mEq/L (ref 135–145)

## 2011-09-10 LAB — LEGIONELLA ANTIGEN, URINE: Legionella Antigen, Urine: NEGATIVE

## 2011-09-10 LAB — CARDIAC PANEL(CRET KIN+CKTOT+MB+TROPI)
CK, MB: 15.5 ng/mL (ref 0.3–4.0)
Relative Index: 0.9 (ref 0.0–2.5)
Troponin I: 0.58 ng/mL (ref <0.30)

## 2011-09-10 LAB — MAGNESIUM
Magnesium: 1.7 mg/dL (ref 1.5–2.5)
Magnesium: 1.7 mg/dL (ref 1.5–2.5)

## 2011-09-10 LAB — BLOOD GAS, ARTERIAL
Acid-base deficit: 5.9 mmol/L — ABNORMAL HIGH (ref 0.0–2.0)
Bicarbonate: 18.6 mEq/L — ABNORMAL LOW (ref 20.0–24.0)
FIO2: 90 %
TCO2: 19.7 mmol/L (ref 0–100)
pCO2 arterial: 34.4 mmHg — ABNORMAL LOW (ref 35.0–45.0)
pH, Arterial: 7.352 (ref 7.350–7.400)
pO2, Arterial: 233 mmHg — ABNORMAL HIGH (ref 80.0–100.0)

## 2011-09-10 LAB — GLUCOSE, CAPILLARY
Glucose-Capillary: 124 mg/dL — ABNORMAL HIGH (ref 70–99)
Glucose-Capillary: 133 mg/dL — ABNORMAL HIGH (ref 70–99)
Glucose-Capillary: 83 mg/dL (ref 70–99)
Glucose-Capillary: 112 mg/dL — ABNORMAL HIGH (ref 70–99)

## 2011-09-10 LAB — URINE CULTURE: Culture: NO GROWTH

## 2011-09-10 LAB — CBC
HCT: 39.4 % (ref 36.0–46.0)
Hemoglobin: 13.1 g/dL (ref 12.0–15.0)
RBC: 3.89 MIL/uL (ref 3.87–5.11)
RDW: 14.1 % (ref 11.5–15.5)
WBC: 8.6 10*3/uL (ref 4.0–10.5)

## 2011-09-10 LAB — HEPARIN LEVEL (UNFRACTIONATED): Heparin Unfractionated: 0.58 IU/mL (ref 0.30–0.70)

## 2011-09-10 MED ORDER — HEPARIN (PORCINE) IN NACL 100-0.45 UNIT/ML-% IJ SOLN
1600.0000 [IU]/h | INTRAMUSCULAR | Status: DC
  Administered 2011-09-10: 650 [IU]/h via INTRAVENOUS
  Administered 2011-09-12: 1300 [IU]/h via INTRAVENOUS
  Administered 2011-09-12: 1150 [IU]/h via INTRAVENOUS
  Administered 2011-09-12: 1300 [IU]/h via INTRAVENOUS
  Administered 2011-09-13: 1600 [IU]/h via INTRAVENOUS
  Filled 2011-09-10 (×6): qty 250

## 2011-09-10 MED ORDER — POTASSIUM CHLORIDE 10 MEQ/50ML IV SOLN
10.0000 meq | INTRAVENOUS | Status: DC
  Administered 2011-09-10 (×4): 10 meq via INTRAVENOUS
  Filled 2011-09-10: qty 150

## 2011-09-10 MED ORDER — POTASSIUM CHLORIDE 20 MEQ/15ML (10%) PO LIQD
ORAL | Status: AC
  Filled 2011-09-10: qty 30

## 2011-09-10 MED ORDER — POTASSIUM CHLORIDE 20 MEQ/15ML (10%) PO LIQD
40.0000 meq | Freq: Once | ORAL | Status: AC
  Administered 2011-09-10: 40 meq
  Filled 2011-09-10: qty 30

## 2011-09-10 MED ORDER — POTASSIUM CHLORIDE 10 MEQ/100ML IV SOLN: 10.0000 meq | INTRAVENOUS | Status: DC

## 2011-09-10 MED ORDER — ASPIRIN 81 MG PO CHEW
81.0000 mg | CHEWABLE_TABLET | Freq: Every day | ORAL | Status: DC
  Administered 2011-09-10 – 2011-09-20 (×11): 81 mg via ORAL
  Filled 2011-09-10 (×10): qty 1

## 2011-09-10 MED ORDER — POTASSIUM CHLORIDE 10 MEQ/50ML IV SOLN
INTRAVENOUS | Status: AC
  Filled 2011-09-10: qty 150

## 2011-09-10 MED ORDER — MAGNESIUM SULFATE 40 MG/ML IJ SOLN
2.0000 g | Freq: Once | INTRAMUSCULAR | Status: AC
  Administered 2011-09-10: 2 g via INTRAVENOUS
  Filled 2011-09-10: qty 50

## 2011-09-10 MED ORDER — SODIUM POLYSTYRENE SULFONATE 15 GM/60ML PO SUSP
15.0000 g | Freq: Once | ORAL | Status: AC
  Administered 2011-09-10: 15 g via ORAL
  Filled 2011-09-10 (×2): qty 60

## 2011-09-10 NOTE — Progress Notes (Signed)
ANTICOAGULATION CONSULT NOTE - Follow Up Consult  Pharmacy Consult for Heparin Indication: chest pain/ACS/hypothermia protocol   No Known Allergies  Heparin Dosing Weight:75kg  Vital Signs: Temp: 91.4 F (33 C) (04/22 1900) Temp src: Core (Comment) (04/22 1600) BP: 117/69 mmHg (04/22 1900) Pulse Rate: 67  (04/22 1900)  Labs:  Basename 09/10/11 1840 09/10/11 1532 09/10/11 1259 09/10/11 1144 09/10/11 0350 09/10/11 0340 09/09/11 2130 09/09/11 1940 09/09/11 1813 09/09/11 1609 09/09/11 1354 09/09/11 1213  HGB -- -- -- -- -- 13.1 -- -- 12.9 -- -- --  HCT -- -- -- -- -- 39.4 -- -- 38.0 41.0 -- --  PLT -- -- -- -- -- 227 -- -- -- -- -- 294  APTT -- -- -- -- -- -- -- 97* -- -- -- 26  LABPROT -- -- -- -- -- -- -- 15.1 -- -- -- 13.4  INR -- -- -- -- -- -- -- 1.17 -- -- -- 1.00  HEPARINUNFRC 0.77* -- -- -- -- 0.58 0.49 -- -- -- -- --  CREATININE -- 0.55 0.56 0.60 -- -- -- -- -- -- -- --  CKTOTAL -- -- -- -- 1676* -- 996* -- -- -- 167 --  CKMB -- -- -- -- 15.5* -- 11.7* -- -- -- 2.3 --  TROPONINI -- -- -- -- 0.58* -- 0.95* -- -- -- <0.30 --   Estimated Creatinine Clearance: 84.5 ml/min (by C-G formula based on Cr of 0.55).  Medications:  Heparin @ 750 units/hr  Assessment: 60yof continues on heparin s/p cardiac arrest, now on arctic sun protocol. Heparin level has been therapeutic on 750 units/hr x 2 but is now above goal so will decrease rate. Has not been re-warmed yet. No bleeding per chart notes.  Goal of Therapy:  Heparin level 0.3-0.7 units/ml   Plan:  1) Decrease heparin to 650 units/hr (~9units/kg/hr) 2) 6h heparin level  Fredrik Rigger 09/10/2011,7:39 PM

## 2011-09-10 NOTE — Progress Notes (Signed)
At 0400 pt temp decreasing to 31.7C.  Pt other vital signs remaining stable HR 60 SR, BP 143/84 .  Levophed off since 0330. Water temp on machine reading 42C, pads are warm to the touch. Checked temp at second site, axillary temp reading 30.8.  Turned up temperature in the room and placed warm blankets on the patient. Called the [employer] [employer] clinical support.  [phone] spoke with [employer] [employer] representative.  Verified that machine appears to be working properly.  [phone] Dr Vassie Loll paged.  [phone] Dr Vassie Loll returned page, aware of patient situation. No orders received over phone, will cont to closely monitor pt.

## 2011-09-10 NOTE — Progress Notes (Addendum)
ANTICOAGULATION CONSULT NOTE - Follow-Up Consult  Pharmacy Consult for heparin Indication: chest pain/ACS/hypothermia protocol  No Known Allergies  Patient Measurements: Height: 5\' 8"  (172.7 cm) Weight: 178 lb 10.2 oz (81.03 kg) IBW/kg (Calculated) : 63.9  Heparin Dosing Weight: 75kg  Vital Signs: Temp: 91.6 F (33.1 C) (04/22 0900) Temp src: Core (Comment) (04/22 0800) BP: 113/75 mmHg (04/22 0900) Pulse Rate: 62  (04/22 0900)  Labs:  Alvira Philips 09/10/11 0755 09/10/11 0350 09/10/11 0340 09/10/11 09/09/11 2130 09/09/11 1940 09/09/11 1813 09/09/11 1609 09/09/11 1354 09/09/11 1213  HGB -- -- 13.1 -- -- -- 12.9 -- -- --  HCT -- -- 39.4 -- -- -- 38.0 41.0 -- --  PLT -- -- 227 -- -- -- -- -- -- 294  APTT -- -- -- -- -- 97* -- -- -- 26  LABPROT -- -- -- -- -- 15.1 -- -- -- 13.4  INR -- -- -- -- -- 1.17 -- -- -- 1.00  HEPARINUNFRC -- -- 0.58 -- 0.49 -- -- -- -- --  CREATININE 0.64 0.57 -- 0.61 -- -- -- -- -- --  CKTOTAL -- 1676* -- -- 996* -- -- -- 167 --  CKMB -- 15.5* -- -- 11.7* -- -- -- 2.3 --  TROPONINI -- 0.58* -- -- 0.95* -- -- -- <0.30 --   Estimated Creatinine Clearance: 84.5 ml/min (by C-G formula based on Cr of 0.64).  Medical History: History reviewed. No pertinent past medical history.   Assessment: 59 YOF who complained of chest pain and then went unresponsive, suspected ACS with initiation of hypothermia protocol.  Heparin level therapeutic. No bleeding noted.  Heparin needs may fluctuate while on the hypothermia protocol.  Will recheck heparin level this evening to ensure she is maintaining goal range.  Goal of Therapy:  Heparin level 0.3-0.7 units/ml   Plan:  1. Continue heparin gtt at 750 units/hr.  2. F/u heparin level and CBC in  ~12 hours  Nadara Mustard, PharmD., MS Clinical Pharmacist Pager:  [phone] Thank you for allowing pharmacy to be part of this patients care team. 09/10/2011,9:47 AM

## 2011-09-10 NOTE — Progress Notes (Signed)
  Echocardiogram 2D Echocardiogram has been performed.  Cathie Beams Deneen 09/10/2011, 9:44 AM

## 2011-09-10 NOTE — Progress Notes (Addendum)
The patient is an obese AA female with a history of tobacco abuse smokes a pack every two weeks. Otherwise healthy. Takes no medications according to the family. She is recently retired from [employer]. The family was with her during the arrest. They stated she said, "I feel Dizzy" Then fell to the floor and became rigid-like. EMS found her in VFib and shocked her twice with epi x2 and chest compressions. Initial cardiac enzymes are negative  Subjective: On Vent, sedated.  Objective: Vital signs in last 24 hours: Temp:  [89.1 F (31.7 C)-97.5 F (36.4 C)] 91.9 F (33.3 C) (04/22 0800) Pulse Rate:  [34-136] 84  (04/22 0819) Resp:  [7-21] 15  (04/22 0800) BP: (85-225)/(58-133) 186/111 mmHg (04/22 0819) SpO2:  [90 %-100 %] 100 % (04/22 0819) Arterial Line BP: (104-189)/(58-119) 128/78 mmHg (04/22 0800) FiO2 (%):  [50 %-100 %] 50 % (04/22 0819) Weight:  [75 kg (165 lb 5.5 oz)-81.03 kg (178 lb 10.2 oz)] 81.03 kg (178 lb 10.2 oz) (04/22 0500) Weight change:    Intake/Output from previous day: -976 04/21 0701 - 04/22 0700 In: 3782.6 [I.V.:3352.6; NG/GT:30; IV Piggyback:400] Out: 4710 [Urine:4610; Emesis/NG output:100] Intake/Output this shift: Total I/O In: 73.1 [I.V.:73.1] Out: 130 [Urine:130]  PE: General:Sedated on Vent, paralyzed to rewarm tonight Neck:No JVD Heart:S1S2 RRR, no M/R/G Lungs:clear ant. Abd:NG in place. Difficult to hear bowel sounds due to pads Ext:no edema + pedal pulses, rt>lt Neuro:sedated.   Lab Results:  Basename 09/10/11 0340 09/09/11 1813 09/09/11 1213  WBC 8.6 -- 10.3  HGB 13.1 12.9 --  HCT 39.4 38.0 --  PLT 227 -- 294   BMET  Basename 09/10/11 0755 09/10/11 0350  NA 143 141  K 3.3* 3.0*  CL 114* 111  CO2 20 17*  GLUCOSE 112* 120*  BUN 6 6  CREATININE 0.64 0.57  CALCIUM 7.5* 7.2*    Basename 09/10/11 0350 09/09/11 2130  TROPONINI 0.58* 0.95*    No results found for this basename: CHOL, HDL, LDLCALC, LDLDIRECT, TRIG, CHOLHDL   No  results found for this basename: HGBA1C     No results found for this basename: TSH    Hepatic Function Panel  Basename 09/09/11 1213  PROT 7.2  ALBUMIN 3.7  AST 253*  ALT 138*  ALKPHOS 119*  BILITOT 0.6  BILIDIR --  IBILI --   EKG: (4/21 - Stachy, LVH with ? Lateral ST depression vs repol abnormalities) --> 4/22 - NSR 65, LVH & QTC ~518.  Studies/Results: Ct Head Wo Contrast  09/09/2011  *RADIOLOGY REPORT*  Clinical Data: Cardiac arrest.  Ventilated.  CT HEAD WITHOUT CONTRAST  Technique:  Contiguous axial images were obtained from the base of the skull through the vertex without contrast.  Comparison: None.  Findings: Bone windows demonstrate no significant soft tissue swelling.  Mild hyperostosis frontalis interna. Clear paranasal sinuses and mastoid air cells.  Soft tissue windows demonstrate physiologic calcifications within the basal ganglia.  Suspect mild small vessel ischemic change, primarily adjacent the posterior horns of the lateral ventricles. No  mass lesion, hemorrhage, hydrocephalus, acute infarct, intra- axial, or extra-axial fluid collection.  IMPRESSION:  1. No acute intracranial abnormality. 2.  Suspect mild small vessel ischemic change.  Original Report Authenticated By: Consuello Bossier, M.D.   Portable Chest Xray In Am  09/10/2011  *RADIOLOGY REPORT*  Clinical Data: Assess endotracheal tube.  PORTABLE CHEST - 1 VIEW  Comparison: 09/09/2011.  Findings: Endotracheal tube is in satisfactory position. Nasogastric tube is followed into the  stomach.  Heart size stable. Lungs are low in volume with some interval improvement in mixed interstitial and air space disease.  IMPRESSION: Improving edema.  Original Report Authenticated By: Reyes Ivan, M.D.   Dg Chest Portable 1 View  09/09/2011  *RADIOLOGY REPORT*  Clinical Data: Central line placement.  PORTABLE CHEST - 1 VIEW  Comparison: 09/09/2011  Findings: Patient has left-sided IJ central line, tip to the superior vena  cava.  Endotracheal tube is in place, tip to the lower trachea, 3.6 cm above carina.  Heart is mildly enlarged. There are patchy infiltrates involving the upper lobes bilaterally, probably not significantly changed accounting for differences in technique.  IMPRESSION:  1.  Interval placement of left IJ central line.  No evidence for pneumothorax. 2.  Persistent patchy infiltrates.  Original Report Authenticated By: Patterson Hammersmith, M.D.   Dg Chest Portable 1 View  09/09/2011  *RADIOLOGY REPORT*  Clinical Data: Post cardiac arrest, endotracheal tube placement  PORTABLE CHEST - 1 VIEW  Comparison: None.  Findings: Enlarged cardiac silhouette.  Normal mediastinal contours given decreased lung volumes and supine patient positioning. Endotracheal tube overlies tracheal air column with tip approximately 1.1 cm from the carina. No supine evidence of pneumothorax or pleural effusion.  Minimal bilateral perihilar heterogeneous opacities, possibly atelectasis.  No acute osseous abnormalities.  IMPRESSION: 1.  Endotracheal tube overlies the tracheal air column with tip approximately 1.1 cm from the carina.  Retraction approximately 3 cm is recommended. No pneumothorax. 2.  Decreased lung volumes with perihilar opacities favored to represent atelectasis.  Original Report Authenticated By: Waynard Reeds, M.D.    Medications: I have reviewed the patient's current medications.    . sodium chloride  2,000 mL Intravenous Once  . antiseptic oral rinse  15 mL Mouth Rinse QID  . artificial tears  1 application Both Eyes Q8H  . aspirin  300 mg Rectal NOW  . chlorhexidine  15 mL Mouth Rinse BID  . cisatracurium  0.1 mg/kg Intravenous Once  . EPINEPHrine      . etomidate      . fentaNYL      . fentaNYL infusion INTRAVENOUS  10 mcg/hr Intravenous To ER  . folic acid  1 mg Intravenous To Major  . heparin  2,500 Units Intravenous Once  . insulin aspart  0-4 Units Subcutaneous Q4H  . lidocaine (cardiac) 100 mg/63ml       . metoprolol  5 mg Intravenous Q6H  . midazolam      . pantoprazole (PROTONIX) IV  40 mg Intravenous QHS  . potassium chloride  10 mEq Intravenous Q1 Hr x 4  . potassium chloride  10 mEq Intravenous Q1 Hr x 6  . potassium chloride      . potassium chloride  40 mEq Per Tube Once  . potassium chloride      . propofol      . propofol  5-70 mcg/kg/min Intravenous Once  . propofol  5 mcg/kg/min (Order-Specific) Intravenous To ER  . rocuronium      . succinylcholine      . thiamine  100 mg Intravenous Daily  . DISCONTD: folic acid (FOLVITE) IVPB  1 mg Intravenous Once  . DISCONTD: heparin  5,000 Units Subcutaneous Q8H  . DISCONTD: heparin  5,000 Units Subcutaneous Q8H  . DISCONTD: metoprolol  5 mg Intravenous Q6H  . DISCONTD: potassium chloride  10 mEq Intravenous Q1 Hr x 6   Assessment/Plan: Patient Active Problem List  Diagnoses  .  Cardiac arrest - ventricular fibrillation  . Acute respiratory failure with hypoxia  . HIE (hypoxic-ischemic encephalopathy)  . 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  V. Fib arrest.  Pk. Troponin  0.95. Pk CK 1676 with MB of 15.5  IV Heparin, IVFentanyl, Cisatracurium, Midazolam, Norepinepherine(which has been titrated for BP).     On Lopressor 5 mg every 6 hours. + Cocaine? Reason of V. Fib.  Plan for cardiac cath in next 2 days,   Echo being done now and will order EKG  K+ is being repalced per tube and 6 runs of 10 meq- ? Need for 6 runs.   Mg+ 1.7.  LOS: 1 day   INGOLD,LAURA R 09/10/2011, 8:41 AM  ATTENDING ATTESTATION:  I have seen and examined the patient along with Nada Boozer, NP.  I have reviewed the chart, notes and new data.  I agree with Laura's note.  Brief Description: 61 y/o mildy obese AAF with witness arrest - ~10 min EMS response time (~12-25min prior to evaluation) --> VT with Shock x 2, epi x 2 with  CPR --> restoration  of perfusing rhythm after ~10 min; intubated --> PCCM initiated Hypothermia protocol. ECG without acute ST changes & troponin only mildly elevated. Unfortunately there is no clear data of PMH.     Key new complaints: intubated, sedated on propofol & hypothermia protocol  Key examination changes: Agree with exam above  Key new findings / data: Cocaine  +, Troponin 0.58 (can be explained with shock & cpr); K+ 3.0; ECG now has upright T waves in lateral leads -- ? Lateral ishcemia  Confusing scenario of VT arrest, + cocaine, prolonged down time with minimal troponin elevation but ? Dynamic TWI in lateral leads. High sensitivity Troponin + in this setting is likely not a NSTEMI but due to prolonged cardiac arrest & resuscitation. A likely scenario is cocaine induced coronary vaso=spasm leading to VTach arrest. Certainly, we cannot exclude existing CAD, but with prolonged down time, we are also not sure of potential neurologic recovery. Yesterday with hemodynamic stability & minimal troponin elevation, cardiac catheterization was deferred.  At this point, it would be reasonable to allow for the re-warming process to determine return of neurologic function before proceeding with cardiac catheterization.    PLAN:  Agree with normalizing electrolyte abnormalities.  Would avoid BB use in the setting of + Cocaine - have d/c'd Metoprolol; should BBs be necessary, would consider Latetalol or Carvedilol (as they both have alpha-antagonistic features), but preferred rate control would be with Diltiazem if tachycardic.  Will review 2D Echo to assess LVEF - for prognostic data.  Provided she remains stable with no further VT, would delay cardiac catheterization until adequate evaluation of neurologic function is possible. --> If VT recurs, would use Amiodarone (suspect elevated liver enzymes are due to "shock liver") --> in this scenario, would proceed to cath sooner.  Agree with IV Heparin & ASA until ACS  with CAD has been ruled out.  Will continue to follow along.  Marykay Lex, M.D., M.S. [facility] [address] [place], [place]  [address]  [phone]  09/10/2011 10:41 AM

## 2011-09-10 NOTE — Progress Notes (Signed)
Name: Dawn Cruz MRN: [id number] DOB: 01-21-1951    LOS: 1  PCCM Progress NOTE  History of Present Illness: 61 yo aaf who complained of chest pain and was witnessed to pass out. EMS response time 10 minutes. Found to be in Vfib, shocked x 2, epi x 2 and brief chest compression. Transported to ED, intubated, coughing on vent prior to sedation and NMB for hypothermia protocol.  PCCM to admit.  Vital Signs: Temp:  [89.1 F (31.7 C)-97.5 F (36.4 C)] 91.6 F (33.1 C) (04/22 0900) Pulse Rate:  [34-136] 62  (04/22 0900) Resp:  [5-21] 5  (04/22 0900) BP: (85-225)/(58-133) 113/75 mmHg (04/22 0900) SpO2:  [90 %-100 %] 100 % (04/22 0900) Arterial Line BP: (104-189)/(58-119) 125/75 mmHg (04/22 0900) FiO2 (%):  [50 %-100 %] 50 % (04/22 0819) Weight:  [165 lb 5.5 oz (75 kg)-178 lb 10.2 oz (81.03 kg)] 178 lb 10.2 oz (81.03 kg) (04/22 0500) I/O last 3 completed shifts: In: 3782.6 [I.V.:3352.6; NG/GT:30; IV Piggyback:400] Out: 4710 [Urine:4610; Emesis/NG output:100]  Lines, Tubes, etc: 4/21 ott>>  4/21 lt i j cvl>>  4/21 a line>>  Microbiology: 4/21 BCx x 2>> NGTD 4/21 Resp cx>> 4/21 Urine cx>> 4/21 MRSA screen>>neg  Antibiotics:  none  Studies/Events: 4/21 VF arrest  4/21 CT head>>> NAP 4/21 2D echo>>>  Consults:  4/22 Cardiology  Subjective: pt remains intubated and sedated.  RN reports some difficulty achieving goal temp overnight but this was ultimately successful.  No other acute events overnight.  Physical Examination: VItal signs reviewed . GEN: Pt appears critically ill.  Intubated and sedated. HEENT: head is autraumatic and normocephalic.   PERRL.  Sclerae anicteric.  Conjunctivae without pallor or injection. ETT in place. RESP:  Coarse sounds noted throughout bilateral lung fields. No wheezing. CARDIOVASCULAR: regular rate, normal rhythm.  Clear S1, S2. ABDOMEN: soft, non-tender, non-distended.  Bowels sounds present in all quadrants and normoactive.  No  palpable masses. EXT: warm and dry.  Peripheral pulses equal, intact, and +2 globally.  No clubbing or cyanosis.  Trace edema in bilateral lower extremities. NEURO: intubated and sedated. Corneals intact.  Not following commands. Does not withdraw to pain.  Babinski's normal bilaterally.    HOSPITAL MEDICATIONS:  I have reviewed the patient's current medications.   Ventilator settings: Vent Mode:  [-] PRVC FiO2 (%):  [50 %-100 %] 50 % Set Rate:  [15 bmp-18 bmp] 15 bmp Vt Set:  [500 mL] 500 mL PEEP:  [5 cmH20] 5 cmH20 Plateau Pressure:  [19 cmH20-21 cmH20] 21 cmH20  Lab Results: Basic Metabolic Panel:  Lab 09/10/11 7829 09/10/11 0755 09/10/11 0350 09/09/11 1402  NA -- 143 141 --  K -- 3.3* 3.0* --  CL -- 114* 111 --  CO2 -- 20 17* --  GLUCOSE -- 112* 120* --  BUN -- 6 6 --  CREATININE -- 0.64 0.57 --  CALCIUM -- 7.5* 7.2* --  MG 1.7 -- -- 1.3*  PHOS -- -- -- 2.5   Liver Function Tests:  Lab 09/09/11 1213  AST 253*  ALT 138*  ALKPHOS 119*  BILITOT 0.6  PROT 7.2  ALBUMIN 3.7   CBC:  Lab 09/10/11 0340 09/09/11 1813 09/09/11 1213  WBC 8.6 -- 10.3  NEUTROABS -- -- 3.0  HGB 13.1 12.9 --  HCT 39.4 38.0 --  MCV 101.3* -- 102.2*  PLT 227 -- 294   Cardiac Enzymes:  Lab 09/10/11 0350 09/09/11 2130 09/09/11 1354  CKTOTAL 1676* 996* 167  CKMB 15.5*  11.7* 2.3  CKMBINDEX -- -- --  TROPONINI 0.58* 0.95* <0.30   CBG:  Lab 09/10/11 0944 09/10/11 0731 09/10/11 0614 09/10/11 0350 09/10/11 0142 09/10/11 0004  GLUCAP 83 106* 98 133* 124* 113*   Coagulation:  Lab 09/09/11 1940 09/09/11 1213  LABPROT 15.1 13.4  INR 1.17 1.00   Urine Drug Screen: Drugs of Abuse     Component Value Date/Time   LABOPIA NONE DETECTED 09/09/2011 1513   COCAINSCRNUR POSITIVE* 09/09/2011 1513   LABBENZ POSITIVE* 09/09/2011 1513   AMPHETMU NONE DETECTED 09/09/2011 1513   THCU NONE DETECTED 09/09/2011 1513   LABBARB NONE DETECTED 09/09/2011 1513    Urinalysis:  Lab 09/09/11 1254  COLORURINE  YELLOW  LABSPEC 1.012  PHURINE 6.0  GLUCOSEU 500*  HGBUR SMALL*  BILIRUBINUR NEGATIVE  KETONESUR NEGATIVE  PROTEINUR 100*  UROBILINOGEN 0.2  NITRITE NEGATIVE  LEUKOCYTESUR NEGATIVE     Labs and Imaging:   Ct Head Wo Contrast  09/09/2011  *RADIOLOGY REPORT*  Clinical Data: Cardiac arrest.  Ventilated.  CT HEAD WITHOUT CONTRAST  Technique:  Contiguous axial images were obtained from the base of the skull through the vertex without contrast.  Comparison: None.  Findings: Bone windows demonstrate no significant soft tissue swelling.  Mild hyperostosis frontalis interna. Clear paranasal sinuses and mastoid air cells.  Soft tissue windows demonstrate physiologic calcifications within the basal ganglia.  Suspect mild small vessel ischemic change, primarily adjacent the posterior horns of the lateral ventricles. No  mass lesion, hemorrhage, hydrocephalus, acute infarct, intra- axial, or extra-axial fluid collection.  IMPRESSION:  1. No acute intracranial abnormality. 2.  Suspect mild small vessel ischemic change.  Original Report Authenticated By: Consuello Bossier, M.D.   Portable Chest Xray In Am  09/10/2011  *RADIOLOGY REPORT*  Clinical Data: Assess endotracheal tube.  PORTABLE CHEST - 1 VIEW  Comparison: 09/09/2011.  Findings: Endotracheal tube is in satisfactory position. Nasogastric tube is followed into the stomach.  Heart size stable. Lungs are low in volume with some interval improvement in mixed interstitial and air space disease.  IMPRESSION: Improving edema.  Original Report Authenticated By: Reyes Ivan, M.D.   Dg Chest Portable 1 View  09/09/2011  *RADIOLOGY REPORT*  Clinical Data: Central line placement.  PORTABLE CHEST - 1 VIEW  Comparison: 09/09/2011  Findings: Patient has left-sided IJ central line, tip to the superior vena cava.  Endotracheal tube is in place, tip to the lower trachea, 3.6 cm above carina.  Heart is mildly enlarged. There are patchy infiltrates involving the upper  lobes bilaterally, probably not significantly changed accounting for differences in technique.  IMPRESSION:  1.  Interval placement of left IJ central line.  No evidence for pneumothorax. 2.  Persistent patchy infiltrates.  Original Report Authenticated By: Patterson Hammersmith, M.D.   Dg Chest Portable 1 View  09/09/2011  *RADIOLOGY REPORT*  Clinical Data: Post cardiac arrest, endotracheal tube placement  PORTABLE CHEST - 1 VIEW  Comparison: None.  Findings: Enlarged cardiac silhouette.  Normal mediastinal contours given decreased lung volumes and supine patient positioning. Endotracheal tube overlies tracheal air column with tip approximately 1.1 cm from the carina. No supine evidence of pneumothorax or pleural effusion.  Minimal bilateral perihilar heterogeneous opacities, possibly atelectasis.  No acute osseous abnormalities.  IMPRESSION: 1.  Endotracheal tube overlies the tracheal air column with tip approximately 1.1 cm from the carina.  Retraction approximately 3 cm is recommended. No pneumothorax. 2.  Decreased lung volumes with perihilar opacities favored to represent atelectasis.  Original Report Authenticated By: Waynard Reeds, M.D.    Assessment and Plan:  VDRF 2/2 VF arrest:  VF arrest likely the result of cocaine use. - Continue hypothermia protocol - Continue full vent support - Heparin gtt - Continue ASA and Lopressor - Cardiology following, plan to proceed with cath in 2 days - F/U 2D echo  Hypokalemia: Mag wnl - Continue to replete with goal K of 4.0 - Mag in am  Cocaine use: UDS positive for cocaine.  EtOH also probable given MCV > 100 and AST:ALT roughly 2:1. - Consider SW consult if patient has meaningful recovery - Consider avoidance of beta-blockers at d/c given theoretical concern for unopposed alpha activity  Best Practice: DVT: on full dose heparin SUP: protonix Nutrition: NPO Glycemic control: SSI Sedation/analgesia:  Propofol/Fentanyl  MILLS,KRISTIN 09/10/2011, 10:50 AM  Maintain on hypothermia protocol, once is complete will need neuro evaluation. Kayexalate given.  Patient seen and examined, agree with above note.  I dictated the care and orders written for this patient under my direction.  CC time 40 minutes.  Koren Bound, M.D. [phone]

## 2011-09-10 NOTE — Progress Notes (Signed)
INITIAL ADULT NUTRITION ASSESSMENT Date: 09/10/2011   Time: 10:38 AM Reason for Assessment: Vent, Low Braden score  ASSESSMENT: Female 61 y.o.  Dx: DVRF  Hx: History reviewed. No pertinent past medical history.  Related Meds:     . sodium chloride  2,000 mL Intravenous Once  . antiseptic oral rinse  15 mL Mouth Rinse QID  . artificial tears  1 application Both Eyes Q8H  . aspirin  81 mg Oral Daily  . aspirin  300 mg Rectal NOW  . chlorhexidine  15 mL Mouth Rinse BID  . cisatracurium  0.1 mg/kg Intravenous Once  . EPINEPHrine      . etomidate      . fentaNYL      . fentaNYL infusion INTRAVENOUS  10 mcg/hr Intravenous To ER  . folic acid  1 mg Intravenous To Major  . heparin  2,500 Units Intravenous Once  . insulin aspart  0-4 Units Subcutaneous Q4H  . lidocaine (cardiac) 100 mg/57ml      . metoprolol  5 mg Intravenous Q6H  . midazolam      . pantoprazole (PROTONIX) IV  40 mg Intravenous QHS  . potassium chloride  10 mEq Intravenous Q1 Hr x 4  . potassium chloride  10 mEq Intravenous Q1 Hr x 6  . potassium chloride      . potassium chloride  40 mEq Per Tube Once  . potassium chloride      . propofol      . propofol  5-70 mcg/kg/min Intravenous Once  . propofol  5 mcg/kg/min (Order-Specific) Intravenous To ER  . rocuronium      . succinylcholine      . thiamine  100 mg Intravenous Daily  . DISCONTD: folic acid (FOLVITE) IVPB  1 mg Intravenous Once  . DISCONTD: heparin  5,000 Units Subcutaneous Q8H  . DISCONTD: heparin  5,000 Units Subcutaneous Q8H  . DISCONTD: metoprolol  5 mg Intravenous Q6H  . DISCONTD: potassium chloride  10 mEq Intravenous Q1 Hr x 6     Ht: 5\' 8"  (172.7 cm)  Wt: 178 lb 10.2 oz (81.03 kg)  Ideal Wt: 63.6 kg % Ideal Wt: 127%  Usual Wt: Unable to obtain % Usual Wt: --  Body mass index is 27.16 kg/(m^2). Over weight  Food/Nutrition Related Hx: nutrition risk screen has not been completed.   Labs:  CMP     Component Value Date/Time   NA  143 09/10/2011 0755   K 3.3* 09/10/2011 0755   CL 114* 09/10/2011 0755   CO2 20 09/10/2011 0755   GLUCOSE 112* 09/10/2011 0755   BUN 6 09/10/2011 0755   CREATININE 0.64 09/10/2011 0755   CALCIUM 7.5* 09/10/2011 0755   PROT 7.2 09/09/2011 1213   ALBUMIN 3.7 09/09/2011 1213   AST 253* 09/09/2011 1213   ALT 138* 09/09/2011 1213   ALKPHOS 119* 09/09/2011 1213   BILITOT 0.6 09/09/2011 1213   GFRNONAA >90 09/10/2011 0755   GFRAA >90 09/10/2011 0755     Intake/Output Summary (Last 24 hours) at 09/10/11 1041 Last data filed at 09/10/11 1000  Gross per 24 hour  Intake 4049.28 ml  Output   5115 ml  Net -1065.72 ml     Diet Order: NPO  Supplements/Tube Feeding: none  IVF:    sodium chloride Last Rate: 10 mL/hr at 09/09/11 1900  sodium chloride Last Rate: 10 mL/hr at 09/09/11 1900  sodium chloride Last Rate: 20 mL/hr at 09/10/11 0200  cisatracurium (NIMBEX) infusion Last Rate:  1.5 mcg/kg/min (09/09/11 2000)  dextrose   fentaNYL infusion INTRAVENOUS Last Rate: 100 mcg/hr (09/10/11 0954)  heparin Last Rate: 750 Units/hr (09/09/11 1900)  midazolam (VERSED) infusion Last Rate: 5 mg/hr (09/10/11 0725)  norepinephrine (LEVOPHED) Adult infusion Last Rate: Stopped (09/10/11 0800)  DISCONTD: cisatracurium (NIMBEX) infusion   DISCONTD: norepinephrine (LEVOPHED) Adult infusion     Estimated Nutritional Needs:   Kcal: 1576  Protein:  120-140 gm Fluid:  > 1.5 L  Pt intubated and sedated post arrest. Arctic sun protocol started, currently 91.6 F (33.1 C). Pt no longer receiving propofol. Plan for cardiac cath in the next few days. K+ being replaced. Pt + for cocaine.  Recommend once pt is rewarmed, if expected to remain on vent >24-48 hours, initiate TF. Pt does not meet criteria for permissive underfeeding, BMI is less then 30.   NUTRITION DIAGNOSIS: -Inadequate oral intake (NI-2.1).  Status: Ongoing  RELATED TO: inability to eat  AS EVIDENCE BY: NPO, vent  status  MONITORING/EVALUATION(Goals): Goal: TF to be initiated if pt remains intubated >48 hrs after rewarmed.  Monitor: vent/extubation, rewarming, weight, labs, I/O's  EDUCATION NEEDS: -No education needs identified at this time  INTERVENTION: 1. If TF initiated recommend, Promote at goal rate of 55 ml/hr with additional 30 ml Pro-stat BID.  This will provide 1520 kcal and 113 gm protein.  2. RD will continue to follow  Dietitian [phone]  DOCUMENTATION CODES Per approved criteria  -Not Applicable    Dawn Cruz 09/10/2011, 10:38 AM

## 2011-09-10 NOTE — Progress Notes (Signed)
After placing artifical tears in pt's eyes, I noticed her twitching from forehead area. I called her name loudly and she  twitched once more with increase in HR & BP also. Pt is currently in the rewarming phase of hypothermia protocol and   should be completely paralyzed and sedated. I adjusted the doses of her sedation gtt's to get her HR & BP back under   control. Will continue to monitor.   Laurena Bering, RN

## 2011-09-11 ENCOUNTER — Inpatient Hospital Stay (HOSPITAL_COMMUNITY): Payer: BC Managed Care – PPO

## 2011-09-11 ENCOUNTER — Encounter (HOSPITAL_COMMUNITY): Payer: Self-pay | Admitting: Cardiology

## 2011-09-11 DIAGNOSIS — I429 Cardiomyopathy, unspecified: Secondary | ICD-10-CM | POA: Diagnosis present

## 2011-09-11 DIAGNOSIS — I340 Nonrheumatic mitral (valve) insufficiency: Secondary | ICD-10-CM

## 2011-09-11 HISTORY — DX: Nonrheumatic mitral (valve) insufficiency: I34.0

## 2011-09-11 LAB — BLOOD GAS, ARTERIAL
Drawn by: 33176
O2 Saturation: 96.6 %
PEEP: 5 cmH2O
Patient temperature: 96.4
RATE: 15 resp/min

## 2011-09-11 LAB — CBC
HCT: 37.2 % (ref 36.0–46.0)
Hemoglobin: 12.5 g/dL (ref 12.0–15.0)
MCH: 33.9 pg (ref 26.0–34.0)
MCHC: 33.6 g/dL (ref 30.0–36.0)

## 2011-09-11 LAB — BASIC METABOLIC PANEL
BUN: 4 mg/dL — ABNORMAL LOW (ref 6–23)
BUN: 5 mg/dL — ABNORMAL LOW (ref 6–23)
BUN: 6 mg/dL (ref 6–23)
BUN: 6 mg/dL (ref 6–23)
CO2: 19 mEq/L (ref 19–32)
CO2: 20 mEq/L (ref 19–32)
CO2: 21 mEq/L (ref 19–32)
Calcium: 8.1 mg/dL — ABNORMAL LOW (ref 8.4–10.5)
Chloride: 110 mEq/L (ref 96–112)
Chloride: 111 mEq/L (ref 96–112)
Chloride: 111 mEq/L (ref 96–112)
Creatinine, Ser: 0.9 mg/dL (ref 0.50–1.10)
GFR calc Af Amer: 80 mL/min — ABNORMAL LOW (ref >90)
GFR calc Af Amer: 68 mL/min — ABNORMAL LOW (ref >90)
GFR calc non Af Amer: >90 mL/min (ref >90)
GFR calc non Af Amer: 69 mL/min — ABNORMAL LOW (ref >90)
GFR calc non Af Amer: 63 mL/min — ABNORMAL LOW (ref >90)
Glucose, Bld: 103 mg/dL — ABNORMAL HIGH (ref 70–99)
Glucose, Bld: 121 mg/dL — ABNORMAL HIGH (ref 70–99)
Glucose, Bld: 128 mg/dL — ABNORMAL HIGH (ref 70–99)
Potassium: 3.5 mEq/L (ref 3.5–5.1)
Potassium: 3.6 mEq/L (ref 3.5–5.1)
Potassium: 3.6 mEq/L (ref 3.5–5.1)
Potassium: 3.8 mEq/L (ref 3.5–5.1)
Sodium: 139 mEq/L (ref 135–145)
Sodium: 141 mEq/L (ref 135–145)

## 2011-09-11 LAB — GLUCOSE, CAPILLARY
Glucose-Capillary: 94 mg/dL (ref 70–99)
Glucose-Capillary: 90 mg/dL (ref 70–99)

## 2011-09-11 LAB — TSH: TSH: 0.559 u[IU]/mL (ref 0.350–4.500)

## 2011-09-11 MED ORDER — NOREPINEPHRINE BITARTRATE 1 MG/ML IJ SOLN
2.0000 ug/min | INTRAVENOUS | Status: DC
  Administered 2011-09-11: 5 ug/min via INTRAVENOUS
  Filled 2011-09-11: qty 8

## 2011-09-11 MED ORDER — FOLIC ACID 5 MG/ML IJ SOLN
1.0000 mg | Freq: Every day | INTRAMUSCULAR | Status: DC
  Administered 2011-09-11 – 2011-09-16 (×6): 1 mg via INTRAVENOUS
  Filled 2011-09-11 (×7): qty 0.2

## 2011-09-11 MED ORDER — PROMOTE PO LIQD
1000.0000 mL | ORAL | Status: DC
  Administered 2011-09-12 – 2011-09-15 (×3): 1000 mL
  Filled 2011-09-11 (×8): qty 1000

## 2011-09-11 MED ORDER — FENTANYL CITRATE 0.05 MG/ML IJ SOLN
50.0000 ug | INTRAMUSCULAR | Status: DC | PRN
  Administered 2011-09-11 – 2011-09-12 (×4): 100 ug via INTRAVENOUS
  Administered 2011-09-16 – 2011-09-17 (×2): 50 ug via INTRAVENOUS
  Administered 2011-09-17: 100 ug via INTRAVENOUS
  Filled 2011-09-11 (×8): qty 2

## 2011-09-11 MED ORDER — ACETAMINOPHEN 80 MG/0.8ML PO SUSP
650.0000 mg | Freq: Four times a day (QID) | ORAL | Status: DC | PRN
Start: 2011-09-11 — End: 2011-09-11
  Filled 2011-09-11: qty 15

## 2011-09-11 MED ORDER — MIDAZOLAM HCL 2 MG/2ML IJ SOLN
2.0000 mg | INTRAMUSCULAR | Status: DC | PRN
  Administered 2011-09-11 – 2011-09-12 (×3): 2 mg via INTRAVENOUS
  Filled 2011-09-11: qty 4
  Filled 2011-09-11 (×2): qty 2

## 2011-09-11 MED ORDER — PRO-STAT SUGAR FREE PO LIQD
30.0000 mL | Freq: Two times a day (BID) | ORAL | Status: DC
  Administered 2011-09-11 – 2011-09-16 (×10): 30 mL
  Filled 2011-09-11 (×13): qty 30

## 2011-09-11 MED ORDER — ACETAMINOPHEN 160 MG/5ML PO SOLN
650.0000 mg | Freq: Four times a day (QID) | ORAL | Status: DC | PRN
  Administered 2011-09-11 – 2011-09-12 (×4): 650 mg
  Filled 2011-09-11 (×4): qty 20.3

## 2011-09-11 NOTE — Progress Notes (Signed)
Nutrition Consult/Follow-up  Initial nutrition assessment completed 4/22. Pt remains intubated & sedated; has completed hypothermia process and is rewarmed. NGT in place. RD consulted for EN initiation & management.  Diet Order:  NPO  Meds: Scheduled Meds:   . antiseptic oral rinse  15 mL Mouth Rinse QID  . aspirin  81 mg Oral Daily  . chlorhexidine  15 mL Mouth Rinse BID  . cisatracurium  0.1 mg/kg Intravenous Once  . folic acid  1 mg Intravenous Daily  . insulin aspart  0-4 Units Subcutaneous Q4H  . magnesium sulfate 1 - 4 g bolus IVPB  2 g Intravenous Once  . pantoprazole (PROTONIX) IV  40 mg Intravenous QHS  . sodium polystyrene  15 g Oral Once  . thiamine  100 mg Intravenous Daily  . DISCONTD: artificial tears  1 application Both Eyes Q8H   Continuous Infusions:   . sodium chloride 10 mL/hr (09/10/11 1353)  . sodium chloride 10 mL/hr (09/10/11 1353)  . sodium chloride 20 mL/hr at 09/10/11 0200  . cisatracurium (NIMBEX) infusion 1.5 mcg/kg/min (09/10/11 2000)  . dextrose    . fentaNYL infusion INTRAVENOUS Stopped (09/11/11 1105)  . heparin 650 Units/hr (09/11/11 1300)  . midazolam (VERSED) infusion Stopped (09/11/11 1105)  . norepinephrine (LEVOPHED) Adult infusion Stopped (09/11/11 1040)  . DISCONTD: heparin 750 Units/hr (09/10/11 1857)  . DISCONTD: norepinephrine (LEVOPHED) Adult infusion 15 mcg/min (09/11/11 0300)   PRN Meds:.acetaminophen (TYLENOL) oral liquid 160 mg/5 mL, fentaNYL, midazolam, DISCONTD: acetaminophen  Labs:  CMP     Component Value Date/Time   NA 141 09/11/2011 1200   K 3.6 09/11/2011 1200   CL 111 09/11/2011 1200   CO2 20 09/11/2011 1200   GLUCOSE 103* 09/11/2011 1200   BUN 6 09/11/2011 1200   CREATININE 0.97 09/11/2011 1200   CALCIUM 8.5 09/11/2011 1200   PROT 7.2 09/09/2011 1213   ALBUMIN 3.7 09/09/2011 1213   AST 253* 09/09/2011 1213   ALT 138* 09/09/2011 1213   ALKPHOS 119* 09/09/2011 1213   BILITOT 0.6 09/09/2011 1213   GFRNONAA 63* 09/11/2011 1200    GFRAA 73* 09/11/2011 1200     Intake/Output Summary (Last 24 hours) at 09/11/11 1415 Last data filed at 09/11/11 1300  Gross per 24 hour  Intake 2268.9 ml  Output   1415 ml  Net  853.9 ml    CBG (last 3)   Basename 09/11/11 0726 09/11/11 0312 09/10/11 2328  GLUCAP 103* 109* 112*    Weight Status:  81 kg (4/22) -- stable  Re-estimated needs:  1576 kcals, 120-140 gm protein  Nutrition Dx:  Inadequate Oral Intake r/t inability to eat as evidenced by NPO status.  Status: Ongoing.  Goal:  EN to be initiated it pt remains intubated >48 hrs after rewarmed, met New Goal: EN to meet >90% of estimated nutrition needs, progressing  Intervention:    Initiate Promote formula at 15 ml/hr and increase by 10 ml every 4 hours to goal rate of 55 ml/hr with Prostat liquid protein 30 ml BID via tube to provide 1520 total kcals, 113 gm protein, 1107 ml of free water  RD to follow for nutrition care plan  Monitor:  EN tolerance, respiratory status, weight, labs, I/O's   Alger Memos Pager #:  [phone]

## 2011-09-11 NOTE — Progress Notes (Addendum)
ANTICOAGULATION CONSULT NOTE - Follow Up Consult  Pharmacy Consult for Heparin Indication: chest pain/ACS/hypothermia protocol   No Known Allergies  Heparin Dosing Weight:75kg  Vital Signs: Temp: 99.3 F (37.4 C) (04/23 0800) Temp src: Core (Comment) (04/23 0800) BP: 82/47 mmHg (04/23 0800) Pulse Rate: 56  (04/23 0800)  Labs:  Basename 09/11/11 0800 09/11/11 0400 09/11/11 0200 09/10/11 2330 09/10/11 1840 09/10/11 0350 09/10/11 0340 09/09/11 2130 09/09/11 1940 09/09/11 1813 09/09/11 1354 09/09/11 1213  HGB -- 12.5 -- -- -- -- 13.1 -- -- -- -- --  HCT -- 37.2 -- -- -- -- 39.4 -- -- 38.0 -- --  PLT -- 223 -- -- -- -- 227 -- -- -- -- 294  APTT -- -- -- -- -- -- -- -- 97* -- -- 26  LABPROT -- -- -- -- -- -- -- -- 15.1 -- -- 13.4  INR -- -- -- -- -- -- -- -- 1.17 -- -- 1.00  HEPARINUNFRC -- -- 0.43 -- 0.77* -- 0.58 -- -- -- -- --  CREATININE 0.90 0.76 -- 0.62 -- -- -- -- -- -- -- --  CKTOTAL -- -- -- -- -- 1676* -- 996* -- -- 167 --  CKMB -- -- -- -- -- 15.5* -- 11.7* -- -- 2.3 --  TROPONINI -- -- -- -- -- 0.58* -- 0.95* -- -- <0.30 --   Estimated Creatinine Clearance: 75.1 ml/min (by C-G formula based on Cr of 0.9).  Assessment: 61 yo female s/p cardiac arrest, now on arctic sun protocol for heparin.   Goal of Therapy:  Heparin level 0.3-0.7 units/ml   Plan:   Continue Heparin at current rate  Recheck heparin level later today after warming to ensure therapeutic goals.   Nadara Mustard, PharmD., MS Clinical Pharmacist Pager:  [phone] Thank you for allowing pharmacy to be part of this patients care team. 09/11/2011,9:31 AM

## 2011-09-11 NOTE — Progress Notes (Signed)
Portable EEG completed

## 2011-09-11 NOTE — Progress Notes (Signed)
The patient is an obese AA female with a history of tobacco abuse smokes a pack every two weeks. Otherwise healthy. Takes no medications according to the family. She is recently retired from [employer]. The family was with her during the arrest. They stated she said, "I feel Dizzy" Then fell to the floor and became rigid-like. EMS found her in VFib and shocked her twice with epi x2 and chest compressions. Initial cardiac enzymes are negative    Subjective: Warming, moving around, still sedated.  Objective: Vital signs in last 24 hours: Temp:  [88.9 F (31.6 C)-98.4 F (36.9 C)] 98.4 F (36.9 C) (04/23 0700) Pulse Rate:  [59-113] 110  (04/23 0700) Resp:  [0-22] 22  (04/23 0700) BP: (92-186)/(56-111) 121/56 mmHg (04/23 0700) SpO2:  [93 %-100 %] 93 % (04/23 0700) Arterial Line BP: (107-182)/(53-89) 182/87 mmHg (04/23 0700) FiO2 (%):  [40 %-50 %] 40 % (04/23 0600) Weight change:    Intake/Output from previous day: +725 04/22 0701 - 04/23 0700 In: 2596.6 [I.V.:2276.6; NG/GT:60; IV Piggyback:260] Out: 1920 [Urine:1920] Intake/Output this shift:    PE: General:sedated, MAE with suctioning.   Skin:cool and dry Neck:No JVD Heart:S1S2, RRR- ST to SR with PACs Lungs:+ rhonchi, lge amt of secretions- creamy yellow UEA:[id number] to assess with pads in place. Ext:no edema, 2+ pedal pulses Neuro:sedated, MAE- slowly waking with warming process and decrease in meds.   Lab Results:  Basename 09/11/11 0400 09/10/11 0340  WBC 12.7* 8.6  HGB 12.5 13.1  HCT 37.2 39.4  PLT 223 227   BMET  Basename 09/11/11 0400 09/10/11 2330  NA 140 139  K 3.8 3.5  CL 111 110  CO2 19 19  GLUCOSE 128* 121*  BUN 4* 5*  CREATININE 0.76 0.62  CALCIUM 8.4 8.1*    Basename 09/10/11 0350 09/09/11 2130  TROPONINI 0.58* 0.95*    No results found for this basename: CHOL,  HDL,  LDLCALC,  LDLDIRECT,  TRIG,  CHOLHDL   No results found for this basename: HGBA1C     No results found for this basename: TSH     Hepatic Function Panel  Basename 09/09/11 1213  PROT 7.2  ALBUMIN 3.7  AST 253*  ALT 138*  ALKPHOS 119*  BILITOT 0.6  BILIDIR --  IBILI --   No results found for this basename: CHOL in the last 72 hours No results found for this basename: PROTIME in the last 72 hours    EKG: Orders placed during the hospital encounter of 09/09/11  . EKG 12-LEAD  . EKG 12-LEAD  . EKG 12-LEAD  . EKG 12-LEAD  . EKG 12-LEAD  . EKG 12-LEAD  . EKG 12-LEAD  . EKG 12-LEAD  . EKG 12-LEAD  . EKG 12-LEAD  . EKG 12-LEAD  . EKG 12-LEAD  . EKG 12-LEAD  . EKG 12-LEAD    Studies/Results: Ct Head Wo Contrast  09/09/2011  *RADIOLOGY REPORT*  Clinical Data: Cardiac arrest.  Ventilated.  CT HEAD WITHOUT CONTRAST  Technique:  Contiguous axial images were obtained from the base of the skull through the vertex without contrast.  Comparison: None.  Findings: Bone windows demonstrate no significant soft tissue swelling.  Mild hyperostosis frontalis interna. Clear paranasal sinuses and mastoid air cells.  Soft tissue windows demonstrate physiologic calcifications within the basal ganglia.  Suspect mild small vessel ischemic change, primarily adjacent the posterior horns of the lateral ventricles. No  mass lesion, hemorrhage, hydrocephalus, acute infarct, intra- axial, or extra-axial fluid collection.  IMPRESSION:  1.  No acute intracranial abnormality. 2.  Suspect mild small vessel ischemic change.  Original Report Authenticated By: Consuello Bossier, M.D.   Dg Chest Port 1 View  09/11/2011  *RADIOLOGY REPORT*  Clinical Data: Evaluate endotracheal tube placement  PORTABLE CHEST - 1 VIEW  Comparison: 09/10/2011; 09/09/2011  Findings: Grossly unchanged enlarged cardiac silhouette and mediastinal contours.  Stable position of support apparatus.  No pneumothorax.  The persistent elevation of the right hemidiaphragm. Grossly unchanged layering right-sided effusion and bilateral perihilar heterogeneous opacities, right  greater than left. Unchanged bones.  IMPRESSION: 1.  Stable positioning of support apparatus.  No pneumothorax. 2.  Grossly unchanged bilateral perihilar heterogeneous opacities, alveolar pulmonary edema versus infection. 3. Grossly unchanged small right-sided effusion.  Original Report Authenticated By: Waynard Reeds, M.D.   Portable Chest Xray In Am  09/10/2011  *RADIOLOGY REPORT*  Clinical Data: Assess endotracheal tube.  PORTABLE CHEST - 1 VIEW  Comparison: 09/09/2011.  Findings: Endotracheal tube is in satisfactory position. Nasogastric tube is followed into the stomach.  Heart size stable. Lungs are low in volume with some interval improvement in mixed interstitial and air space disease.  IMPRESSION: Improving edema.  Original Report Authenticated By: Reyes Ivan, M.D.   Dg Chest Portable 1 View  09/09/2011  *RADIOLOGY REPORT*  Clinical Data: Central line placement.  PORTABLE CHEST - 1 VIEW  Comparison: 09/09/2011  Findings: Patient has left-sided IJ central line, tip to the superior vena cava.  Endotracheal tube is in place, tip to the lower trachea, 3.6 cm above carina.  Heart is mildly enlarged. There are patchy infiltrates involving the upper lobes bilaterally, probably not significantly changed accounting for differences in technique.  IMPRESSION:  1.  Interval placement of left IJ central line.  No evidence for pneumothorax. 2.  Persistent patchy infiltrates.  Original Report Authenticated By: Patterson Hammersmith, M.D.   Dg Chest Portable 1 View  09/09/2011  *RADIOLOGY REPORT*  Clinical Data: Post cardiac arrest, endotracheal tube placement  PORTABLE CHEST - 1 VIEW  Comparison: None.  Findings: Enlarged cardiac silhouette.  Normal mediastinal contours given decreased lung volumes and supine patient positioning. Endotracheal tube overlies tracheal air column with tip approximately 1.1 cm from the carina. No supine evidence of pneumothorax or pleural effusion.  Minimal bilateral perihilar  heterogeneous opacities, possibly atelectasis.  No acute osseous abnormalities.  IMPRESSION: 1.  Endotracheal tube overlies the tracheal air column with tip approximately 1.1 cm from the carina.  Retraction approximately 3 cm is recommended. No pneumothorax. 2.  Decreased lung volumes with perihilar opacities favored to represent atelectasis.  Original Report Authenticated By: Waynard Reeds, M.D.   2D Echo:  Left ventricle: The cavity size was moderately dilated. There was moderate Eccentric Hypertrophy. Systolic function was severely reduced. The estimated ejection fraction was in the range of 10% to 20%. Severe diffuse hypokinesis. Although no diagnostic regional wall motion abnormality was identified, this possibility cannot be completely excluded on the basis of this study. Doppler parameters are consistent with elevated mean left atrial filling pressure - in part due to MR. - Mitral valve: Moderate regurgitation with a central jet.  - Left atrium: The atrium was moderately dilated. - Right ventricle: Systolic function was mildly reduced. Impressions:  - Study suggests left ventricular dysfunction without congestive heart failure, with elevated left-sided pressure and severely reduced cardiac output. The dysfunction is both systolic and diastolic  Medications: I have reviewed the patient's current medications.    Marland Kitchen antiseptic oral rinse  15 mL Mouth Rinse QID  . artificial tears  1 application Both Eyes Q8H  . aspirin  81 mg Oral Daily  . chlorhexidine  15 mL Mouth Rinse BID  . cisatracurium  0.1 mg/kg Intravenous Once  . insulin aspart  0-4 Units Subcutaneous Q4H  . magnesium sulfate 1 - 4 g bolus IVPB  2 g Intravenous Once  . pantoprazole (PROTONIX) IV  40 mg Intravenous QHS  . potassium chloride      . potassium chloride  40 mEq Per Tube Once  . potassium chloride      . propofol  5 mcg/kg/min (Order-Specific) Intravenous To ER  . sodium polystyrene  15 g Oral Once  . thiamine   100 mg Intravenous Daily  . DISCONTD: metoprolol  5 mg Intravenous Q6H  . DISCONTD: potassium chloride  10 mEq Intravenous Q1 Hr x 6  . DISCONTD: potassium chloride  10 mEq Intravenous Q1 Hr x 6   Assessment/Plan: Patient Active Problem List  Diagnoses  . Cardiac arrest - ventricular fibrillation, with Artic Sun Protocol  . Acute respiratory failure with hypoxia  . HIE (hypoxic-ischemic encephalopathy)  . Dependence on respirator, status  . 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 MR (mitral regurgitation), moderate   PLAN: Significant cardiomyopathy-? Cause of arrest or reason.  Will need cardiac cath one she is stable. Pk. Troponin 0.95 Hypokalemia improved, would like to keep K+ around 4.0.  Mag. Is 2.0  Continues on IV heparin, Fentanyl, midazolam, and  Low dose Norepinephrine.  BP stable. Continues with Vent. Have held BB secondary to +cocaine in drug screen.  HR slightly tachycardia.   LOS: 2 days   INGOLD,LAURA R 09/11/2011, 7:58 AM   Patient seen & examined. Chart reviewed.  S/p VT arrest - warmed up now - Arctic sun off - pressors off.  Still sedated & intubated but opens eyes. I agree with Corliss Blacker note.  Echo with severe dilated CM - EF <20% global HK -- would suspect NICM, but if neurologic function returns, would consider diagnostic LHC to ensure no obstructive CAD present.   Extremely low EF + cocaine would make for excellent condition to stimulate VT arrest.   Once BP stable & extubated will need to initiate ICM medications - ACE-I, spironolactone etc, but would hold BB for now due to cocaine (would use Carvediolol when initiating).  Tachycardia may well be compensatory. No evidence of pulmonary edema - but would monitor as she is susceptible. Ensure K & Mag levels remain stable. IV Heparin not unreasonable for now, but could switch to DVT prophylaxis Lovenox to avoid XS volume.  We will continue to follow.  Would not plan to schedule cath  until more neurologically intact.   Marykay Lex, M.D., M.S. [facility] [address] [place], [place]  [address]  [phone] Pager # [phone]  09/11/2011 4:40 PM

## 2011-09-11 NOTE — Progress Notes (Signed)
ANTICOAGULATION CONSULT NOTE - Follow Up Consult  Pharmacy Consult for Heparin Indication: chest pain/ACS/hypothermia protocol   No Known Allergies  Heparin Dosing Weight:75kg  Vital Signs: Temp: 95.4 F (35.2 C) (04/23 0200) Temp src: Core (Comment) (04/23 0100) BP: 116/71 mmHg (04/23 0200) Pulse Rate: 86  (04/23 0200)  Labs:  Basename 09/11/11 0200 09/10/11 2330 09/10/11 2015 09/10/11 1840 09/10/11 1532 09/10/11 0350 09/10/11 0340 09/09/11 2130 09/09/11 1940 09/09/11 1813 09/09/11 1609 09/09/11 1354 09/09/11 1213  HGB -- -- -- -- -- -- 13.1 -- -- 12.9 -- -- --  HCT -- -- -- -- -- -- 39.4 -- -- 38.0 41.0 -- --  PLT -- -- -- -- -- -- 227 -- -- -- -- -- 294  APTT -- -- -- -- -- -- -- -- 97* -- -- -- 26  LABPROT -- -- -- -- -- -- -- -- 15.1 -- -- -- 13.4  INR -- -- -- -- -- -- -- -- 1.17 -- -- -- 1.00  HEPARINUNFRC 0.43 -- -- 0.77* -- -- 0.58 -- -- -- -- -- --  CREATININE -- 0.62 0.65 -- 0.55 -- -- -- -- -- -- -- --  CKTOTAL -- -- -- -- -- 1676* -- 996* -- -- -- 167 --  CKMB -- -- -- -- -- 15.5* -- 11.7* -- -- -- 2.3 --  TROPONINI -- -- -- -- -- 0.58* -- 0.95* -- -- -- <0.30 --   Estimated Creatinine Clearance: 84.5 ml/min (by C-G formula based on Cr of 0.62).  Assessment: 61 yo female s/p cardiac arrest, now on arctic sun protocol for heparin.   Goal of Therapy:  Heparin level 0.3-0.7 units/ml   Plan:  Continue Heparin at current rate  Dawn Cruz, Dawn Cruz 09/11/2011,2:42 AM

## 2011-09-11 NOTE — Progress Notes (Signed)
ANTICOAGULATION CONSULT NOTE - Follow-Up Consult  Pharmacy Consult for heparin Indication: chest pain/ACS/hypothermia protocol  No Known Allergies  Patient Measurements: Height: 5\' 8"  (172.7 cm) Weight: 178 lb 10.2 oz (81.03 kg) IBW/kg (Calculated) : 63.9  Heparin Dosing Weight: 75kg  Vital Signs: Temp: 102 F (38.9 C) (04/23 2300) Temp src: Core (Comment) (04/23 2300) BP: 139/78 mmHg (04/23 2300) Pulse Rate: 114  (04/23 2300)  Labs:  Basename 09/11/11 2209 09/11/11 1610 09/11/11 1235 09/11/11 1200 09/11/11 0800 09/11/11 0400 09/11/11 0200 09/10/11 0350 09/10/11 0340 09/09/11 2130 09/09/11 1940 09/09/11 1813 09/09/11 1354 09/09/11 1213  HGB -- -- -- -- -- 12.5 -- -- 13.1 -- -- -- -- --  HCT -- -- -- -- -- 37.2 -- -- 39.4 -- -- 38.0 -- --  PLT -- -- -- -- -- 223 -- -- 227 -- -- -- -- 294  APTT -- -- -- -- -- -- -- -- -- -- 97* -- -- 26  LABPROT -- -- -- -- -- -- -- -- -- -- 15.1 -- -- 13.4  INR -- -- -- -- -- -- -- -- -- -- 1.17 -- -- 1.00  HEPARINUNFRC 0.19* -- 0.19* -- -- -- 0.43 -- -- -- -- -- -- --  CREATININE -- 1.03 -- 0.97 0.90 -- -- -- -- -- -- -- -- --  CKTOTAL -- -- -- -- -- -- -- 1676* -- 996* -- -- 167 --  CKMB -- -- -- -- -- -- -- 15.5* -- 11.7* -- -- 2.3 --  TROPONINI -- -- -- -- -- -- -- 0.58* -- 0.95* -- -- <0.30 --   Estimated Creatinine Clearance: 65.6 ml/min (by C-G formula based on Cr of 1.03).  Assessment: 61 YO Female with  suspected ACS s/p Vfib arrest/hypothermia protocol, for heparin  Goal of Therapy:  Heparin level 0.3-0.7 units/ml   Plan:  Increase Heparin 1150 units/hr Follow-up am labs.   09/11/2011,11:30 PM

## 2011-09-11 NOTE — Progress Notes (Signed)
ANTICOAGULATION CONSULT NOTE - Follow-Up Consult  Pharmacy Consult for heparin Indication: chest pain/ACS/hypothermia protocol  No Known Allergies  Patient Measurements: Height: 5\' 8"  (172.7 cm) Weight: 178 lb 10.2 oz (81.03 kg) IBW/kg (Calculated) : 63.9  Heparin Dosing Weight: 75kg  Vital Signs: Temp: 100.6 F (38.1 C) (04/23 1300) Temp src: Core (Comment) (04/23 1300) BP: 129/66 mmHg (04/23 1300) Pulse Rate: 102  (04/23 1300)  Labs:  Basename 09/11/11 1235 09/11/11 1200 09/11/11 0800 09/11/11 0400 09/11/11 0200 09/10/11 1840 09/10/11 0350 09/10/11 0340 09/09/11 2130 09/09/11 1940 09/09/11 1813 09/09/11 1354 09/09/11 1213  HGB -- -- -- 12.5 -- -- -- 13.1 -- -- -- -- --  HCT -- -- -- 37.2 -- -- -- 39.4 -- -- 38.0 -- --  PLT -- -- -- 223 -- -- -- 227 -- -- -- -- 294  APTT -- -- -- -- -- -- -- -- -- 97* -- -- 26  LABPROT -- -- -- -- -- -- -- -- -- 15.1 -- -- 13.4  INR -- -- -- -- -- -- -- -- -- 1.17 -- -- 1.00  HEPARINUNFRC 0.19* -- -- -- 0.43 0.77* -- -- -- -- -- -- --  CREATININE -- 0.97 0.90 0.76 -- -- -- -- -- -- -- -- --  CKTOTAL -- -- -- -- -- -- 1676* -- 996* -- -- 167 --  CKMB -- -- -- -- -- -- 15.5* -- 11.7* -- -- 2.3 --  TROPONINI -- -- -- -- -- -- 0.58* -- 0.95* -- -- <0.30 --   Estimated Creatinine Clearance: 69.7 ml/min (by C-G formula based on Cr of 0.97).  Medical History: Past Medical History  Diagnosis Date  . Cardiomyopathy, severe by echo with EF 10-20% 09/11/2011  . MR (mitral regurgitation), moderate 09/11/2011     Assessment: 60 YOF who complained of chest pain and then went unresponsive, suspected ACS with initiation of hypothermia protocol.  She has been rewarmed and heparin requirements are now increased.  Repeat heparin level is 0.19 IU/ml.  No bleeding noted.    Goal of Therapy:  Heparin level 0.3-0.7 units/ml   Plan:  1. Increase IV heparin gtt to 900 units/hr.  2. F/u heparin level in 8 hours and adjust rate as needed.   Nadara Mustard,  PharmD., MS Clinical Pharmacist Pager:  [phone] Thank you for allowing pharmacy to be part of this patients care team. 09/11/2011,1:58 PM

## 2011-09-11 NOTE — Progress Notes (Signed)
UR Completed.  Dawn Cruz [phone] 09/11/2011

## 2011-09-11 NOTE — Procedures (Signed)
EEG NUMBER:  REFERRING PHYSICIAN:  Felipa Evener, MD  HISTORY:  A 61 year old female on the ventilator after arrest.  MEDICATIONS:  Fentanyl, Versed, heparin, NovoLog, Levophed.  CONDITIONS OF RECORDING:  This is a 16 channel EEG carried out with the patient in the lethargic state.  DESCRIPTION:  Background activity consists of a polymorphic delta rhythm of 1-3 Hz.  This is poorly organized and seen over all quadrants.  There is superimposed beta activity that is seen throughout the tracing.  No epileptiform activity is noted.  Hypoventilation was performed.  It produced no change in the tracing.  Hypoventilation and intermittent photic stimulation were not performed.  The patient was stimulated during the tracing.  Some reactivity was noted with stimulation.  IMPRESSION:  This is an abnormal EEG secondary to general background slowing.  This finding can be seen with a diffuse disturbance, it is etiologically nonspecific, but may include a metabolic encephalopathy or hypoxic encephalopathy among other possibilities.  Evidence of reactivity was seen during the tracing.          ______________________________ Thana Farr, MD    [id number] D:  09/11/2011 17:45:27  T:  09/11/2011 21:26:44  Job #:  [id number]

## 2011-09-11 NOTE — Progress Notes (Signed)
Name: Dawn Cruz MRN: [id number] DOB: 08/22/1950    LOS: 2  PCCM Progress NOTE  History of Present Illness: 61 yo aaf who complained of chest pain and was witnessed to pass out. EMS response time 10 minutes. Found to be in Vfib, shocked x 2, epi x 2 and brief chest compression. Transported to ED, intubated, coughing on vent prior to sedation and NMB for hypothermia protocol.  PCCM to admit.  Vital Signs: Temp:  [88.9 F (31.6 C)-101.7 F (38.7 C)] 101.7 F (38.7 C) (04/23 1100) Pulse Rate:  [56-118] 109  (04/23 1100) Resp:  [0-22] 18  (04/23 1100) BP: (82-150)/(47-84) 92/57 mmHg (04/23 1100) SpO2:  [93 %-99 %] 98 % (04/23 1100) Arterial Line BP: (107-182)/(45-89) 108/55 mmHg (04/23 1100) FiO2 (%):  [40 %-50 %] 40 % (04/23 1100) I/O last 3 completed shifts: In: 3565 [I.V.:3245; NG/GT:60; IV Piggyback:260] Out: 3780 [Urine:3780]  Lines, Tubes, etc: 4/21 ott>>  4/21 lt i j cvl>>  4/21 a line>>  Microbiology: 4/21 BCx x 2>> NGTD 4/21 Resp cx>> 4/21 Urine cx>>NG 4/21 MRSA screen>>neg  Antibiotics:  none  Studies/Events: 4/21 VF arrest  4/21 CT head>>> NAP 4/21 2D echo>>> Systolic function was severely reduced. The estimated ejection fraction was in the range of 10% to 20%. Severe diffuse hypokinesis.  Moderate MV regurg.  Consults:  4/22 Cardiology  Subjective: pt remains intubated and sedated. Patient has now completed hypothermia process and is rewarmed.  No other acute events overnight.  Physical Examination: VItal signs reviewed . GEN: Pt appears critically ill.  Intubated and sedated. HEENT: head is autraumatic and normocephalic.   PERRL.  Sclerae anicteric.  Conjunctivae without pallor or injection. ETT in place. RESP:  Coarse sounds noted throughout bilateral lung fields. No wheezing. CARDIOVASCULAR: regular rate, normal rhythm.  Clear S1, S2. ABDOMEN: soft, non-tender, non-distended.  Bowels sounds present in all quadrants and normoactive.  No palpable  masses. EXT: warm and dry.  Peripheral pulses equal, intact, and +2 globally.  No clubbing or cyanosis.  Trace edema in bilateral lower extremities. NEURO: intubated and sedated. Corneals intact.  Not following commands. Does not withdraw to pain.  Babinski's normal bilaterally.    HOSPITAL MEDICATIONS:  I have reviewed the patient's current medications.   Ventilator settings: Vent Mode:  [-] PRVC FiO2 (%):  [40 %-50 %] 40 % Set Rate:  [15 bmp] 15 bmp Vt Set:  [500 mL] 500 mL PEEP:  [5 cmH20] 5 cmH20 Plateau Pressure:  [9 cmH20-22 cmH20] 22 cmH20  Lab Results: Basic Metabolic Panel:  Lab 09/11/11 4098 09/11/11 0400 09/10/11 1144 09/09/11 1402  NA 140 140 -- --  K 3.9 3.8 -- --  CL 111 111 -- --  CO2 20 19 -- --  GLUCOSE 113* 128* -- --  BUN 5* 4* -- --  CREATININE 0.90 0.76 -- --  CALCIUM 8.5 8.4 -- --  MG -- 2.0 1.7 --  PHOS -- 3.1 -- 2.5   Liver Function Tests:  Lab 09/09/11 1213  AST 253*  ALT 138*  ALKPHOS 119*  BILITOT 0.6  PROT 7.2  ALBUMIN 3.7   CBC:  Lab 09/11/11 0400 09/10/11 0340 09/09/11 1213  WBC 12.7* 8.6 --  NEUTROABS -- -- 3.0  HGB 12.5 13.1 --  HCT 37.2 39.4 --  MCV 100.8* 101.3* --  PLT 223 227 --   Cardiac Enzymes:  Lab 09/10/11 0350 09/09/11 2130 09/09/11 1354  CKTOTAL 1676* 996* 167  CKMB 15.5* 11.7* 2.3  CKMBINDEX -- -- --  TROPONINI 0.58* 0.95* <0.30   CBG:  Lab 09/11/11 0726 09/11/11 0312 09/10/11 2328 09/10/11 2007 09/10/11 1536 09/10/11 1336  GLUCAP 103* 109* 112* 103* 115* 96   Coagulation:  Lab 09/09/11 1940 09/09/11 1213  LABPROT 15.1 13.4  INR 1.17 1.00   Urine Drug Screen: Drugs of Abuse     Component Value Date/Time   LABOPIA NONE DETECTED 09/09/2011 1513   COCAINSCRNUR POSITIVE* 09/09/2011 1513   LABBENZ POSITIVE* 09/09/2011 1513   AMPHETMU NONE DETECTED 09/09/2011 1513   THCU NONE DETECTED 09/09/2011 1513   LABBARB NONE DETECTED 09/09/2011 1513    Urinalysis:  Lab 09/09/11 1254  COLORURINE YELLOW  LABSPEC  1.012  PHURINE 6.0  GLUCOSEU 500*  HGBUR SMALL*  BILIRUBINUR NEGATIVE  KETONESUR NEGATIVE  PROTEINUR 100*  UROBILINOGEN 0.2  NITRITE NEGATIVE  LEUKOCYTESUR NEGATIVE     Labs and Imaging:   Ct Head Wo Contrast  09/09/2011  *RADIOLOGY REPORT*  Clinical Data: Cardiac arrest.  Ventilated.  CT HEAD WITHOUT CONTRAST  Technique:  Contiguous axial images were obtained from the base of the skull through the vertex without contrast.  Comparison: None.  Findings: Bone windows demonstrate no significant soft tissue swelling.  Mild hyperostosis frontalis interna. Clear paranasal sinuses and mastoid air cells.  Soft tissue windows demonstrate physiologic calcifications within the basal ganglia.  Suspect mild small vessel ischemic change, primarily adjacent the posterior horns of the lateral ventricles. No  mass lesion, hemorrhage, hydrocephalus, acute infarct, intra- axial, or extra-axial fluid collection.  IMPRESSION:  1. No acute intracranial abnormality. 2.  Suspect mild small vessel ischemic change.  Original Report Authenticated By: Consuello Bossier, M.D.   Dg Chest Port 1 View  09/11/2011  *RADIOLOGY REPORT*  Clinical Data: Evaluate endotracheal tube placement  PORTABLE CHEST - 1 VIEW  Comparison: 09/10/2011; 09/09/2011  Findings: Grossly unchanged enlarged cardiac silhouette and mediastinal contours.  Stable position of support apparatus.  No pneumothorax.  The persistent elevation of the right hemidiaphragm. Grossly unchanged layering right-sided effusion and bilateral perihilar heterogeneous opacities, right greater than left. Unchanged bones.  IMPRESSION: 1.  Stable positioning of support apparatus.  No pneumothorax. 2.  Grossly unchanged bilateral perihilar heterogeneous opacities, alveolar pulmonary edema versus infection. 3. Grossly unchanged small right-sided effusion.  Original Report Authenticated By: Waynard Reeds, M.D.   Portable Chest Xray In Am  09/10/2011  *RADIOLOGY REPORT*  Clinical  Data: Assess endotracheal tube.  PORTABLE CHEST - 1 VIEW  Comparison: 09/09/2011.  Findings: Endotracheal tube is in satisfactory position. Nasogastric tube is followed into the stomach.  Heart size stable. Lungs are low in volume with some interval improvement in mixed interstitial and air space disease.  IMPRESSION: Improving edema.  Original Report Authenticated By: Reyes Ivan, M.D.   Dg Chest Portable 1 View  09/09/2011  *RADIOLOGY REPORT*  Clinical Data: Central line placement.  PORTABLE CHEST - 1 VIEW  Comparison: 09/09/2011  Findings: Patient has left-sided IJ central line, tip to the superior vena cava.  Endotracheal tube is in place, tip to the lower trachea, 3.6 cm above carina.  Heart is mildly enlarged. There are patchy infiltrates involving the upper lobes bilaterally, probably not significantly changed accounting for differences in technique.  IMPRESSION:  1.  Interval placement of left IJ central line.  No evidence for pneumothorax. 2.  Persistent patchy infiltrates.  Original Report Authenticated By: Patterson Hammersmith, M.D.   Dg Chest Portable 1 View  09/09/2011  *RADIOLOGY REPORT*  Clinical Data: Post cardiac arrest,  endotracheal tube placement  PORTABLE CHEST - 1 VIEW  Comparison: None.  Findings: Enlarged cardiac silhouette.  Normal mediastinal contours given decreased lung volumes and supine patient positioning. Endotracheal tube overlies tracheal air column with tip approximately 1.1 cm from the carina. No supine evidence of pneumothorax or pleural effusion.  Minimal bilateral perihilar heterogeneous opacities, possibly atelectasis.  No acute osseous abnormalities.  IMPRESSION: 1.  Endotracheal tube overlies the tracheal air column with tip approximately 1.1 cm from the carina.  Retraction approximately 3 cm is recommended. No pneumothorax. 2.  Decreased lung volumes with perihilar opacities favored to represent atelectasis.  Original Report Authenticated By: Waynard Reeds, M.D.     Assessment and Plan:  VDRF 2/2 VF arrest:  VF arrest likely the result of cocaine use.  2D echo reveals severely reduced LV function with EF 10-20%.  Will decrease sedation this am and proceed with WUA. - Continue full vent support for now. - Heparin gtt. - Continue ASA. - Lopressor d/c'd 2/2 cocaine use. - Cardiology following, plan to proceed with cath once mental status is accurately assessed.  Hypokalemia: Mag wnl - Continue to replete with goal K of 4.0.  Cocaine use: UDS positive for cocaine.  EtOH also probable given MCV > 100 and AST:ALT roughly 2:1. - Consider SW consult if patient has meaningful recovery - Avoid of beta-blockers at d/c given theoretical concern for unopposed alpha activity with cocaine.  Elevated LFTs: AST:ALT elevated at 2:1 split suggesting EtOH use; hypotension and "shock liver" also likely contributing to abnormal liver enzymes. - Repeat LFTs in am. - Consider statin if LFTs normalize.  Best Practice: DVT: on full dose heparin SUP: protonix Nutrition: NPO Glycemic control: SSI Sedation/analgesia: Propofol/Fentanyl  MILLS,KRISTIN 09/11/2011, 11:17 AM  Met with the son and ex-husband, they reports patient drinks regularly.  Will add thiamine and folate, f/u LFTs, will order EEG and head CT for AM and consult neuro in AM.  CC time 45 minutes.  Patient seen and examined, agree with above note.  I dictated the care and orders written for this patient under my direction.  Koren Bound, M.D. [phone]

## 2011-09-12 ENCOUNTER — Inpatient Hospital Stay (HOSPITAL_COMMUNITY): Payer: BC Managed Care – PPO

## 2011-09-12 LAB — GLUCOSE, CAPILLARY
Glucose-Capillary: 126 mg/dL — ABNORMAL HIGH (ref 70–99)
Glucose-Capillary: 117 mg/dL — ABNORMAL HIGH (ref 70–99)
Glucose-Capillary: 116 mg/dL — ABNORMAL HIGH (ref 70–99)

## 2011-09-12 LAB — HEPATIC FUNCTION PANEL
AST: 38 U/L — ABNORMAL HIGH (ref 0–37)
Bilirubin, Direct: 0.2 mg/dL (ref 0.0–0.3)
Total Bilirubin: 0.7 mg/dL (ref 0.3–1.2)

## 2011-09-12 LAB — URINE MICROSCOPIC-ADD ON

## 2011-09-12 LAB — BASIC METABOLIC PANEL
BUN: 11 mg/dL (ref 6–23)
Chloride: 110 mEq/L (ref 96–112)
Creatinine, Ser: 1.06 mg/dL (ref 0.50–1.10)
GFR calc Af Amer: 65 mL/min — ABNORMAL LOW (ref >90)
GFR calc non Af Amer: 56 mL/min — ABNORMAL LOW (ref >90)

## 2011-09-12 LAB — BLOOD GAS, ARTERIAL
Drawn by: 33176
MECHVT: 500 mL
RATE: 15 resp/min
pCO2 arterial: 37.1 mmHg (ref 35.0–45.0)
pH, Arterial: 7.399 (ref 7.350–7.400)

## 2011-09-12 LAB — HEPARIN LEVEL (UNFRACTIONATED): Heparin Unfractionated: 0.29 IU/mL — ABNORMAL LOW (ref 0.30–0.70)

## 2011-09-12 LAB — PRO B NATRIURETIC PEPTIDE: Pro B Natriuretic peptide (BNP): 638.4 pg/mL — ABNORMAL HIGH (ref 0–125)

## 2011-09-12 LAB — CBC
HCT: 32.3 % — ABNORMAL LOW (ref 36.0–46.0)
MCHC: 33.1 g/dL (ref 30.0–36.0)
MCV: 102.2 fL — ABNORMAL HIGH (ref 78.0–100.0)
Platelets: 204 10*3/uL (ref 150–400)
RDW: 14.4 % (ref 11.5–15.5)
WBC: 10.2 10*3/uL (ref 4.0–10.5)

## 2011-09-12 LAB — MAGNESIUM: Magnesium: 1.8 mg/dL (ref 1.5–2.5)

## 2011-09-12 LAB — URINALYSIS, ROUTINE W REFLEX MICROSCOPIC
Bilirubin Urine: NEGATIVE
Glucose, UA: NEGATIVE mg/dL
Ketones, ur: NEGATIVE mg/dL
Nitrite: NEGATIVE
Specific Gravity, Urine: 1.008 (ref 1.005–1.030)
pH: 6 (ref 5.0–8.0)

## 2011-09-12 MED ORDER — FUROSEMIDE 10 MG/ML IJ SOLN
40.0000 mg | Freq: Three times a day (TID) | INTRAMUSCULAR | Status: AC
  Administered 2011-09-12 (×2): 40 mg via INTRAVENOUS
  Filled 2011-09-12 (×2): qty 4

## 2011-09-12 MED ORDER — CARVEDILOL 3.125 MG PO TABS
3.1250 mg | ORAL_TABLET | ORAL | Status: AC
  Administered 2011-09-12: 3.125 mg via ORAL
  Filled 2011-09-12: qty 1

## 2011-09-12 MED ORDER — FENTANYL CITRATE 0.05 MG/ML IJ SOLN
50.0000 ug/h | INTRAMUSCULAR | Status: DC
  Administered 2011-09-12: 50 ug/h via INTRAVENOUS
  Administered 2011-09-15: 150 ug/h via INTRAVENOUS
  Administered 2011-09-15: 50 ug/h via INTRAVENOUS
  Administered 2011-09-15: 150 ug/h via INTRAVENOUS
  Administered 2011-09-16 (×2): 50 ug/h via INTRAVENOUS
  Administered 2011-09-16: 100 ug/h via INTRAVENOUS
  Filled 2011-09-12 (×5): qty 50

## 2011-09-12 MED ORDER — CARVEDILOL 3.125 MG PO TABS
3.1250 mg | ORAL_TABLET | Freq: Two times a day (BID) | ORAL | Status: DC
  Administered 2011-09-12: 3.125 mg via ORAL
  Filled 2011-09-12 (×5): qty 1

## 2011-09-12 MED ORDER — PROPOFOL 10 MG/ML IV EMUL
5.0000 ug/kg/min | INTRAVENOUS | Status: DC
Start: 2011-09-12 — End: 2011-09-16
  Administered 2011-09-12: 15 ug/kg/min via INTRAVENOUS
  Administered 2011-09-12: 30 ug/kg/min via INTRAVENOUS
  Administered 2011-09-13: 20 ug/kg/min via INTRAVENOUS
  Filled 2011-09-12 (×2): qty 100

## 2011-09-12 MED ORDER — VANCOMYCIN HCL IN DEXTROSE 1-5 GM/200ML-% IV SOLN
1000.0000 mg | Freq: Two times a day (BID) | INTRAVENOUS | Status: DC
  Administered 2011-09-12 – 2011-09-13 (×4): 1000 mg via INTRAVENOUS
  Filled 2011-09-12 (×6): qty 200

## 2011-09-12 MED ORDER — POTASSIUM CHLORIDE 20 MEQ/15ML (10%) PO LIQD
40.0000 meq | Freq: Once | ORAL | Status: AC
  Administered 2011-09-12: 40 meq
  Filled 2011-09-12: qty 30

## 2011-09-12 MED ORDER — POTASSIUM PHOSPHATE DIBASIC 3 MMOLE/ML IV SOLN
30.0000 mmol | Freq: Once | INTRAVENOUS | Status: AC
  Administered 2011-09-12: 30 mmol via INTRAVENOUS
  Filled 2011-09-12: qty 10

## 2011-09-12 MED ORDER — FENTANYL BOLUS VIA INFUSION
50.0000 ug | Freq: Four times a day (QID) | INTRAVENOUS | Status: DC | PRN
  Filled 2011-09-12: qty 100

## 2011-09-12 MED ORDER — MAGNESIUM SULFATE 40 MG/ML IJ SOLN
2.0000 g | Freq: Once | INTRAMUSCULAR | Status: AC
  Administered 2011-09-12: 2 g via INTRAVENOUS
  Filled 2011-09-12: qty 50

## 2011-09-12 MED ORDER — PIPERACILLIN-TAZOBACTAM 3.375 G IVPB
3.3750 g | Freq: Three times a day (TID) | INTRAVENOUS | Status: DC
  Administered 2011-09-12 – 2011-09-14 (×6): 3.375 g via INTRAVENOUS
  Filled 2011-09-12 (×8): qty 50

## 2011-09-12 MED ORDER — ADULT MULTIVITAMIN LIQUID CH
5.0000 mL | Freq: Every day | ORAL | Status: DC
  Administered 2011-09-12 – 2011-09-16 (×5): 5 mL via ORAL
  Filled 2011-09-12 (×7): qty 5

## 2011-09-12 MED ORDER — PROPOFOL 10 MG/ML IV EMUL
INTRAVENOUS | Status: AC
  Administered 2011-09-12: 30 ug/kg/min via INTRAVENOUS
  Filled 2011-09-12: qty 100

## 2011-09-12 NOTE — Progress Notes (Signed)
ANTICOAGULATION CONSULT NOTE - Follow-Up Consult  Pharmacy Consult for heparinIndication:  chest pain/ACS/hypothermia protocol  No Known Allergies  Patient Measurements: Height: 5\' 8"  (172.7 cm) Weight: 184 lb 11.9 oz (83.8 kg) IBW/kg (Calculated) : 63.9  Heparin Dosing Weight: 75kg  Vital Signs: Temp: 100.6 F (38.1 C) (04/24 1400) Temp src: Core (Comment) (04/24 0700) BP: 96/58 mmHg (04/24 1400) Pulse Rate: 97  (04/24 1400)  Labs:  Basename 09/12/11 1400 09/12/11 0800 09/12/11 0400 09/11/11 2209 09/11/11 1610 09/11/11 1200 09/11/11 0400 09/10/11 0350 09/10/11 0340 09/09/11 2130 09/09/11 1940  HGB -- -- 10.7* -- -- -- 12.5 -- -- -- --  HCT -- -- 32.3* -- -- -- 37.2 -- 39.4 -- --  PLT -- -- 204 -- -- -- 223 -- 227 -- --  APTT -- -- -- -- -- -- -- -- -- -- 97*  LABPROT -- -- -- -- -- -- -- -- -- -- 15.1  INR -- -- -- -- -- -- -- -- -- -- 1.17  HEPARINUNFRC 0.29* 0.43 -- 0.19* -- -- -- -- -- -- --  CREATININE -- -- 1.06 -- 1.03 0.97 -- -- -- -- --  CKTOTAL -- -- -- -- -- -- -- 1676* -- 996* --  CKMB -- -- -- -- -- -- -- 15.5* -- 11.7* --  TROPONINI -- -- -- -- -- -- -- 0.58* -- 0.95* --   Estimated Creatinine Clearance: 64.9 ml/min (by C-G formula based on Cr of 1.06).  Assessment: 61 YO Female with  suspected ACS s/p Vfib arrest/hypothermia protocol.  Anticoagulation - Heparin level 0.43 now dropped below range to 0.29.  Goal of Therapy:  Heparin level 0.3-0.7 units/ml Vanco trough 15-20   Plan:  1. Increase heparin to 1300 units/hr  09/12/2011,2:37 PM

## 2011-09-12 NOTE — Progress Notes (Signed)
eLink Physician-Brief Progress Note Patient Name: Dawn Cruz DOB: March 11, 1951 MRN: [id number]  Date of Service  09/12/2011   HPI/Events of Note   Hypokalemia  eICU Interventions  Potassium replaced   Intervention Category Minor Interventions: Electrolytes abnormality - evaluation and management  Deverick Pruss 09/12/2011, 5:37 AM

## 2011-09-12 NOTE — Progress Notes (Signed)
Report from Night RN. Chart reviewed together. Handoff complete.  

## 2011-09-12 NOTE — Progress Notes (Addendum)
ANTICOAGULATION CONSULT NOTE - Follow-Up Consult  Pharmacy Consult for heparin + new Vanco/Zosyn Indication: chest pain/ACS/hypothermia protocol                     Suspected PNA.  No Known Allergies  Patient Measurements: Height: 5\' 8"  (172.7 cm) Weight: 184 lb 11.9 oz (83.8 kg) IBW/kg (Calculated) : 63.9  Heparin Dosing Weight: 75kg  Vital Signs: Temp: 100.3 F (37.9 C) (04/24 0700) Temp src: Core (Comment) (04/24 0700) BP: 175/87 mmHg (04/24 0748) Pulse Rate: 99  (04/24 0700)  Labs:  Basename 09/12/11 0800 09/12/11 0400 09/11/11 2209 09/11/11 1610 09/11/11 1235 09/11/11 1200 09/11/11 0400 09/10/11 0350 09/10/11 0340 09/09/11 2130 09/09/11 1940 09/09/11 1354 09/09/11 1213  HGB -- 10.7* -- -- -- -- 12.5 -- -- -- -- -- --  HCT -- 32.3* -- -- -- -- 37.2 -- 39.4 -- -- -- --  PLT -- 204 -- -- -- -- 223 -- 227 -- -- -- --  APTT -- -- -- -- -- -- -- -- -- -- 97* -- 26  LABPROT -- -- -- -- -- -- -- -- -- -- 15.1 -- 13.4  INR -- -- -- -- -- -- -- -- -- -- 1.17 -- 1.00  HEPARINUNFRC 0.43 -- 0.19* -- 0.19* -- -- -- -- -- -- -- --  CREATININE -- 1.06 -- 1.03 -- 0.97 -- -- -- -- -- -- --  CKTOTAL -- -- -- -- -- -- -- 1676* -- 996* -- 167 --  CKMB -- -- -- -- -- -- -- 15.5* -- 11.7* -- 2.3 --  TROPONINI -- -- -- -- -- -- -- 0.58* -- 0.95* -- <0.30 --   Estimated Creatinine Clearance: 64.9 ml/min (by C-G formula based on Cr of 1.06).  Assessment: 61 YO Female with  suspected ACS s/p Vfib arrest/hypothermia protocol.  Anticoagulation - Heparin level 0.43 now in goal range.  Cardiovascular" c/o CP prior to becoming unresponsive. S/p Vfib (shock x 2, epi x 2). Drug screen + for cocaine/benzo's. EF 10-20%. Now with some tachycardia - Elevated cardiac enzymes - cath in next 48 hours if neurologic recovery. 82/47 to 165/84 with HR 56-128 Meds: ASA 81mg , Coreg, IV lasix, K,  Infectious Disease -Mild leukocytosis, negative MRSA by PCR. Tmax 102. CXR 4/24: marked worsening in airspace disease  throughout the right chest. Dense left basilar opacity is also identified. Start Vanco/Zosyn for possible PNA.   Goal of Therapy:  Heparin level 0.3-0.7 units/ml Vanco trough 15-20   Plan:  1. Heparin 1150 units/hr. Re-verify level in 6 hrs. 2. Vanco 1g IV q12h. Trough after 3-5 doses. 3. Zosyn 3.375g IV q8hr.   09/12/2011,9:23 AM

## 2011-09-12 NOTE — Progress Notes (Addendum)
Clinical Social Worker received a referral for Competency/Guardianship issues.  CSW spoke with RN and MD; MD stated order placed in error.  CSW to sign off at this time, please re consult if additional needs arise.  Per request of RN, CSW phoned pt's family ([phone])  to offer support. CSW left voicemail message offering support and encouraging family to call if needed.    Angelia Mould, MSW, [place] [phone]

## 2011-09-12 NOTE — Progress Notes (Signed)
Name: Dawn Cruz MRN: [id number] DOB: 09-04-1950    LOS: 3  PCCM Progress NOTE  History of Present Illness: 61 yo aaf who complained of chest pain and was witnessed to pass out. EMS response time 10 minutes. Found to be in Vfib, shocked x 2, epi x 2 and brief chest compression. Transported to ED, intubated, coughing on vent prior to sedation and NMB for hypothermia protocol.  PCCM to admit.    Vital Signs: Temp:  [99.2 F (37.3 C)-102 F (38.9 C)] 100.3 F (37.9 C) (04/24 0700) Pulse Rate:  [72-128] 99  (04/24 0700) Resp:  [15-32] 32  (04/24 0748) BP: (92-175)/(42-87) 175/87 mmHg (04/24 0748) SpO2:  [95 %-100 %] 98 % (04/24 0748) Arterial Line BP: (91-227)/(46-108) 113/55 mmHg (04/24 0700) FiO2 (%):  [40 %] 40 % (04/24 0748) Weight:  [83.8 kg (184 lb 11.9 oz)] 83.8 kg (184 lb 11.9 oz) (04/24 0400) I/O last 3 completed shifts: In: 2960.2 [I.V.:2520.2; NG/GT:420; IV Piggyback:20] Out: 1912 [Urine:1912]  Lines, Tubes, etc: 4/21 ETT>>  4/21 LT IJ CVL>>  4/21 A Line>>  Microbiology: 4/21 BCx x 2>> NGTD 4/21 Resp cx>>GPC in pairs. 4/21 Urine cx>>NG 4/21 MRSA screen>>neg Blood 4/24>>> Sputum 4/24>>>  Antibiotics:  Vancomycin 4/24>>> Zosyn 4/24>>>  Studies/Events: 4/21 VF arrest  4/21 CT head>>> NAP 4/21 2D echo>>> Systolic function was severely reduced. The estimated ejection fraction was in the range of 10% to 20%. Severe diffuse hypokinesis.  Moderate MV regurg.  Consults:  4/22 Cardiology  Subjective: pt remains intubated and sedated. Patient has now completed hypothermia process and is rewarmed.  No other acute events overnight.  Physical Examination: VItal signs reviewed . GEN: Pt appears critically ill.  Intubated and sedated. HEENT: head is autraumatic and normocephalic.   PERRL.  Sclerae anicteric.  Conjunctivae without pallor or injection. ETT in place. RESP:  Coarse sounds noted throughout bilateral lung fields. No wheezing. CARDIOVASCULAR: regular  rate, normal rhythm.  Clear S1, S2. ABDOMEN: soft, non-tender, non-distended.  Bowels sounds present in all quadrants and normoactive.  No palpable masses. EXT: warm and dry.  Peripheral pulses equal, intact, and +2 globally.  No clubbing or cyanosis.  Trace edema in bilateral lower extremities. NEURO: intubated and sedated. Corneals intact.  Not following commands. Does not withdraw to pain.  Babinski's normal bilaterally.    HOSPITAL MEDICATIONS:  I have reviewed the patient's current medications.   Ventilator settings: Vent Mode:  [-] PSV;CPAP FiO2 (%):  [40 %] 40 % Set Rate:  [15 bmp] 15 bmp Vt Set:  [500 mL] 500 mL PEEP:  [5 cmH20] 5 cmH20 Pressure Support:  [10 cmH20] 10 cmH20 Plateau Pressure:  [12 cmH20-21 cmH20] 21 cmH20  Lab Results: Basic Metabolic Panel:  Lab 09/12/11 4098 09/11/11 1610 09/11/11 0400  NA 141 141 --  K 3.2* 3.6 --  CL 110 111 --  CO2 22 21 --  GLUCOSE 102* 103* --  BUN 11 6 --  CREATININE 1.06 1.03 --  CALCIUM 8.6 8.3* --  MG 1.8 -- 2.0  PHOS 2.2* -- 3.1   Liver Function Tests:  Lab 09/12/11 0400 09/09/11 1213  AST 38* 253*  ALT 53* 138*  ALKPHOS 110 119*  BILITOT 0.7 0.6  PROT 6.0 7.2  ALBUMIN 2.5* 3.7   CBC:  Lab 09/12/11 0400 09/11/11 0400 09/09/11 1213  WBC 10.2 12.7* --  NEUTROABS -- -- 3.0  HGB 10.7* 12.5 --  HCT 32.3* 37.2 --  MCV 102.2* 100.8* --  PLT 204  223 --   Cardiac Enzymes:  Lab 09/10/11 0350 09/09/11 2130 09/09/11 1354  CKTOTAL 1676* 996* 167  CKMB 15.5* 11.7* 2.3  CKMBINDEX -- -- --  TROPONINI 0.58* 0.95* <0.30   CBG:  Lab 09/12/11 0810 09/12/11 0325 09/11/11 2354 09/11/11 1920 09/11/11 1558 09/11/11 1212  GLUCAP 89 103* 108* 90 94 108*   Coagulation:  Lab 09/09/11 1940 09/09/11 1213  LABPROT 15.1 13.4  INR 1.17 1.00   Urine Drug Screen: Drugs of Abuse     Component Value Date/Time   LABOPIA NONE DETECTED 09/09/2011 1513   COCAINSCRNUR POSITIVE* 09/09/2011 1513   LABBENZ POSITIVE* 09/09/2011 1513    AMPHETMU NONE DETECTED 09/09/2011 1513   THCU NONE DETECTED 09/09/2011 1513   LABBARB NONE DETECTED 09/09/2011 1513    Urinalysis:  Lab 09/09/11 1254  COLORURINE YELLOW  LABSPEC 1.012  PHURINE 6.0  GLUCOSEU 500*  HGBUR SMALL*  BILIRUBINUR NEGATIVE  KETONESUR NEGATIVE  PROTEINUR 100*  UROBILINOGEN 0.2  NITRITE NEGATIVE  LEUKOCYTESUR NEGATIVE     Labs and Imaging:   Dg Chest Port 1 View  09/12/2011  *RADIOLOGY REPORT*  Clinical Data: Shortness of breath.  Intubated patient.  PORTABLE CHEST - 1 VIEW  Comparison: Chest 09/10/2011 and 09/11/2011.  Findings: Support apparatus is unchanged.  There has been marked worsening in airspace disease throughout the right chest.  Dense left basilar opacity is also identified.  No pneumothorax.  IMPRESSION: Marked worsening in airspace disease on the right.  Left basilar airspace opacity also shows some increase.  Original Report Authenticated By: Bernadene Bell. Maricela Curet, M.D.   Dg Chest Port 1 View  09/11/2011  *RADIOLOGY REPORT*  Clinical Data: Evaluate endotracheal tube placement  PORTABLE CHEST - 1 VIEW  Comparison: 09/10/2011; 09/09/2011  Findings: Grossly unchanged enlarged cardiac silhouette and mediastinal contours.  Stable position of support apparatus.  No pneumothorax.  The persistent elevation of the right hemidiaphragm. Grossly unchanged layering right-sided effusion and bilateral perihilar heterogeneous opacities, right greater than left. Unchanged bones.  IMPRESSION: 1.  Stable positioning of support apparatus.  No pneumothorax. 2.  Grossly unchanged bilateral perihilar heterogeneous opacities, alveolar pulmonary edema versus infection. 3. Grossly unchanged small right-sided effusion.  Original Report Authenticated By: Waynard Reeds, M.D.   Ct Portable Head W/o Cm  09/11/2011  *RADIOLOGY REPORT*  Clinical Data: Cardiac arrest, question of anoxic injury  CT HEAD WITHOUT CONTRAST  Technique:  Contiguous axial images were obtained from the base of  the skull through the vertex without contrast.  Comparison: 09/09/2011  Findings: Endotracheal and nasogastric tubes partly visualized. Allowing for differences in orientation and scan plane, there is no change in ventricular diameter. No acute hemorrhage, acute infarction, or mass lesion is seen.  No midline shift.  Orbits and paranasal sinuses are intact.  IMPRESSION: Allowing for suboptimal visualization of portable technique, no gross evidence for anoxic brain injury or acute intracranial finding.  Original Report Authenticated By: Harrel Lemon, M.D.   Assessment and Plan:  VDRF 2/2 VF arrest:  VF arrest likely the result of cocaine use.  2D echo reveals severely reduced LV function with EF 10-20%.  Will decrease sedation this am and proceed with WUA. - Will start PS trials today. - Heparin gtt. - Continue ASA. - Lopressor d/c'd 2/2 cocaine use will start carvedilol today. - Cardiology following, plan to proceed with cath once mental status is accurately assessed.  Hypokalemia: Mag low. - Continue to replete with goal K of 4.0 and Mg with F/U lytes  in AM.  Cocaine use: UDS positive for cocaine.  EtOH also probable given MCV > 100 and AST:ALT roughly 2:1. - Consider SW consult if patient has meaningful recovery - Low dose carvedilol today.  Elevated LFTs: AST:ALT elevated at 2:1 split suggesting EtOH use; hypotension and "shock liver" also likely contributing to abnormal liver enzymes. - Repeat LFTs in am. - Consider statin if LFTs normalize. - Continue TF.  Spoke with the family extensively yesterday, they wish to continue full code for now depending on neuro evaluation.  Best Practice: DVT: on full dose heparin SUP: protonix Nutrition: NPO Glycemic control: SSI Sedation/analgesia: Propofol/Fentanyl  CC time 45 minutes.  Patient seen and examined, agree with above note.  I dictated the care and orders written for this patient under my direction.  Koren Bound,  M.D. [phone]

## 2011-09-12 NOTE — Progress Notes (Addendum)
[facility] DAILY PROGRESS NOTE  NAME:  Dawn Cruz   MRN: [id number] DOB:  1950-08-19   ADMIT DATE: 09/09/2011   Patient Description    The patient is a 61 y/o obese AA female with a history of tobacco abuse smokes a pack every two weeks. Otherwise healthy. Takes no medications according to the family. She is recently retired from [employer]. The family was with her during the arrest. They stated she said, "I feel Dizzy" Then fell to the floor and became rigid-like. EMS found her in VFib and shocked her twice with epi x2 and chest compressions. Initial cardiac enzymes are negative      Newly Diagnosed  Diagnosis Date  . Cardiomyopathy, severe by echo with EF 10-20% 09/11/2011  . MR (mitral regurgitation), moderate 09/11/2011   Cocaine & EtOH abuse  Tobacco abuse  Clinical Course: Length of Stay:  LOS: 3 days    Subjective:   Today Dawn Cruz continues on sedation weaning.   Was on spontaneous breathing trials yesterday - continuing weaning this AM. Per RN - will open eyes & follow simple command, but has episodic anxiety / agitation with significant BP elevation.  Objective:  Temp:  [99.2 F (37.3 C)-102 F (38.9 C)] 100.3 F (37.9 C) (04/24 0700) Pulse Rate:  [72-128] 99  (04/24 0700) Resp:  [15-32] 32  (04/24 0748) BP: (92-175)/(42-87) 175/87 mmHg (04/24 0748) SpO2:  [95 %-100 %] 98 % (04/24 0748) Arterial Line BP: (91-227)/(46-108) 113/55 mmHg (04/24 0700) FiO2 (%):  [40 %] 40 % (04/24 0748) Weight:  [83.8 kg (184 lb 11.9 oz)] 83.8 kg (184 lb 11.9 oz) (04/24 0400) Weight change:  Physical Exam: General appearance: no distress and intubated & sedated; opens eyes to voice Lungs: coarse ventillator breath sounds, no clear rales or rhonchi Heart: regular rate and rhythm, S1, S2 normal, no S3 or S4 and systolic murmur: holosystolic 1/6, blowing at apex Abdomen: soft, non-tender; bowel sounds normal; no masses,  no  organomegaly Extremities: extremities normal, atraumatic, no cyanosis or edema Neurologic: Mental status: alertness: sedated, opens eyes to voice & touch intubated; somewhat sedated but does open eyes  Intake/Output from previous day: 04/23 0701 - 04/24 0700 In: 1546.9 [I.V.:1116.9; NG/GT:420; IV Piggyback:10] Out: 1012 [Urine:1012]  Intake/Output Summary (Last 24 hours) at 09/12/11 0801 Last data filed at 09/12/11 0700  Gross per 24 hour  Intake 1489.77 ml  Output   1012 ml  Net 477.77 ml    Pertinent Lab Results: LFTs & Cr remain stable; proBNP elevated @ 638 (but not sure of trend) CBC: no significant CBC findings  Tracheal aspirate results GPCs noted.  Imaging: CXR (Personally reviewed) -Marked worsening in airspace disease on the right. Left basilar airspace opacity also shows some increase.  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  + Troponin in setting of sustained VT & CPR etc., could simply be from her cardiomyopathy.  ? If this is a progressive condition vs. New onset.  Recommend:  As BP stabilizes now that she is  off pressors, would gradually begin & titrate ACE-I/ARB for afterload reduction 1st; once at a stable dose (or if significant tachycardia persists) --tachycardia could be due to fever, agitation, stress or event compensatory with low EF.  We will continue to monitor for signs of neurologic recovery.    With new diagnosis of Cardiomyopathy & VT - cardiac catheterization is  reasonable to exclude an underlying ischemic condition; however, at this point, we need to show signs of meaningful neurologic recovery prior to proceeding to cath.  She does not appear to be in florid CHF, but with worsening CXR findings - ? If this could potentially be pulmonary edema?  Low threshold for trial of diuresis.  Time Spent Directly with Patient:  15 minutes   Amyria Komar W, M.D., M.S. [facility] [address] [place], [place]  [address]  [phone]  09/12/2011 8:01 AM

## 2011-09-13 ENCOUNTER — Inpatient Hospital Stay (HOSPITAL_COMMUNITY): Payer: BC Managed Care – PPO

## 2011-09-13 LAB — URINE CULTURE
Colony Count: NO GROWTH
Culture  Setup Time: 201304241335
Culture: NO GROWTH

## 2011-09-13 LAB — BASIC METABOLIC PANEL
BUN: 15 mg/dL (ref 6–23)
Calcium: 8.9 mg/dL (ref 8.4–10.5)
Chloride: 103 mEq/L (ref 96–112)
Creatinine, Ser: 1.14 mg/dL — ABNORMAL HIGH (ref 0.50–1.10)
GFR calc Af Amer: 60 mL/min — ABNORMAL LOW (ref >90)

## 2011-09-13 LAB — POCT I-STAT 3, ART BLOOD GAS (G3+)
Acid-Base Excess: 3 mmol/L — ABNORMAL HIGH (ref 0.0–2.0)
Patient temperature: 99.7
pH, Arterial: 7.434 — ABNORMAL HIGH (ref 7.350–7.400)
pO2, Arterial: 66 mmHg — ABNORMAL LOW (ref 80.0–100.0)

## 2011-09-13 LAB — CBC
HCT: 31.8 % — ABNORMAL LOW (ref 36.0–46.0)
MCH: 33.5 pg (ref 26.0–34.0)
MCV: 99.7 fL (ref 78.0–100.0)
Platelets: 216 10*3/uL (ref 150–400)
RDW: 14.2 % (ref 11.5–15.5)
WBC: 10.3 10*3/uL (ref 4.0–10.5)

## 2011-09-13 LAB — BLOOD GAS, ARTERIAL
MECHVT: 500 mL
RATE: 22 resp/min
pCO2 arterial: 31.9 mmHg — ABNORMAL LOW (ref 35.0–45.0)
pH, Arterial: 7.501 — ABNORMAL HIGH (ref 7.350–7.400)
pO2, Arterial: 61 mmHg — ABNORMAL LOW (ref 80.0–100.0)

## 2011-09-13 LAB — GLUCOSE, CAPILLARY
Glucose-Capillary: 107 mg/dL — ABNORMAL HIGH (ref 70–99)
Glucose-Capillary: 111 mg/dL — ABNORMAL HIGH (ref 70–99)
Glucose-Capillary: 118 mg/dL — ABNORMAL HIGH (ref 70–99)
Glucose-Capillary: 130 mg/dL — ABNORMAL HIGH (ref 70–99)
Glucose-Capillary: 150 mg/dL — ABNORMAL HIGH (ref 70–99)

## 2011-09-13 LAB — HEPARIN LEVEL (UNFRACTIONATED): Heparin Unfractionated: 0.22 IU/mL — ABNORMAL LOW (ref 0.30–0.70)

## 2011-09-13 MED ORDER — CLONIDINE ORAL SUSPENSION 10 MCG/ML
0.1000 mg | Freq: Three times a day (TID) | ORAL | Status: DC
  Filled 2011-09-13 (×5): qty 10

## 2011-09-13 MED ORDER — CARVEDILOL 6.25 MG PO TABS
6.2500 mg | ORAL_TABLET | Freq: Two times a day (BID) | ORAL | Status: DC
  Administered 2011-09-13 – 2011-09-20 (×13): 6.25 mg via ORAL
  Filled 2011-09-13 (×17): qty 1

## 2011-09-13 MED ORDER — ETOMIDATE 2 MG/ML IV SOLN
INTRAVENOUS | Status: AC
  Administered 2011-09-13: 10:00:00
  Filled 2011-09-13: qty 20

## 2011-09-13 MED ORDER — SUCCINYLCHOLINE CHLORIDE 20 MG/ML IJ SOLN
INTRAMUSCULAR | Status: AC
  Filled 2011-09-13: qty 10

## 2011-09-13 MED ORDER — CLONIDINE HCL 0.1 MG PO TABS
0.1000 mg | ORAL_TABLET | Freq: Three times a day (TID) | ORAL | Status: DC
  Administered 2011-09-13: 0.1 mg via ORAL
  Filled 2011-09-13 (×4): qty 1

## 2011-09-13 MED ORDER — SODIUM CHLORIDE 0.9 % IV SOLN
1.0000 mg/h | INTRAVENOUS | Status: DC
  Administered 2011-09-13: 4 mg/h via INTRAVENOUS
  Filled 2011-09-13: qty 10

## 2011-09-13 MED ORDER — LIDOCAINE HCL (CARDIAC) 20 MG/ML IV SOLN
INTRAVENOUS | Status: AC
  Filled 2011-09-13: qty 5

## 2011-09-13 MED ORDER — ROCURONIUM BROMIDE 50 MG/5ML IV SOLN
INTRAVENOUS | Status: AC
  Administered 2011-09-13: 10:00:00
  Filled 2011-09-13: qty 2

## 2011-09-13 MED ORDER — MIDAZOLAM BOLUS VIA INFUSION
1.0000 mg | INTRAVENOUS | Status: DC | PRN
  Filled 2011-09-13: qty 2

## 2011-09-13 MED ORDER — POTASSIUM PHOSPHATE DIBASIC 3 MMOLE/ML IV SOLN
30.0000 mmol | Freq: Once | INTRAVENOUS | Status: AC
  Administered 2011-09-13: 30 mmol via INTRAVENOUS
  Filled 2011-09-13: qty 10

## 2011-09-13 MED ORDER — FUROSEMIDE 10 MG/ML IJ SOLN
40.0000 mg | Freq: Four times a day (QID) | INTRAMUSCULAR | Status: AC
  Administered 2011-09-13 (×3): 40 mg via INTRAVENOUS
  Filled 2011-09-13 (×3): qty 4

## 2011-09-13 MED ORDER — SODIUM CHLORIDE 0.9 % IV SOLN
1.0000 mg/h | INTRAVENOUS | Status: DC
  Administered 2011-09-13 (×2): 2 mg/h via INTRAVENOUS
  Administered 2011-09-15 (×3): 3 mg/h via INTRAVENOUS
  Administered 2011-09-16: 2 mg/h via INTRAVENOUS
  Administered 2011-09-16: 3 mg/h via INTRAVENOUS
  Filled 2011-09-13 (×7): qty 10

## 2011-09-13 MED ORDER — POTASSIUM CHLORIDE 20 MEQ/15ML (10%) PO LIQD
40.0000 meq | Freq: Three times a day (TID) | ORAL | Status: AC
  Administered 2011-09-13 (×2): 40 meq
  Filled 2011-09-13 (×2): qty 30

## 2011-09-13 NOTE — Progress Notes (Signed)
At 0541, after performing portable CXR on patient, technician left room and reported to a nurse that patient was acting wild and someone needed to check on her. Upon overhearing this conversation, I hurried into patient's room to discover that she was sitting up in bed, coughing, with pillows & top sheet on floor & legs over end of bed.  BP and respiratory rate were both elevated.  Patient initially laid back on bed when instructed to do so, but after attempting to place her legs back up on mattress, she once again raised up in the bed.  She proceeded to grab at ETT.  I was able to grasp both of her hands before she was able to extubate herself.  She began thrashing about and fighting to regain control of her ETT and NGT.  During this event, the ETT became disconnected from the ventilator.  Help arrived, and patient received a bolus of Fentanyl and Propofol to calm her.  Oxygen saturated remained low.  Large amounts of blood-tinged tan secretions were suctioned from ETT with little improvement in saturation.  Patient was ambued on 100%  Oxygen with PEEP=10.  Eventually oxygen saturations improved, and patient was placed back on ventilator with FiO2 of 100%.

## 2011-09-13 NOTE — Progress Notes (Signed)
Pt's Sp02 88%, moderate amount of secretions, Dr. Ignacia Marvel called, increased PEEP +8

## 2011-09-13 NOTE — Progress Notes (Signed)
ANTICOAGULATION CONSULT NOTE - Follow-Up Consult  Pharmacy Consult for heparin Indication:  chest pain/ACS/ s/p hypothermia protocol  No Known Allergies  Patient Measurements: Height: 5\' 8"  (172.7 cm) Weight: 178 lb 12.7 oz (81.1 kg) IBW/kg (Calculated) : 63.9  Heparin Dosing Weight: 75kg  Vital Signs: Temp: 101 F (38.3 C) (04/25 1600) Temp src: Core (Comment) (04/25 1600) BP: 141/77 mmHg (04/25 1600) Pulse Rate: 103  (04/25 1600)  Labs:  Basename 09/13/11 1544 09/13/11 0400 09/12/11 1400 09/12/11 0400 09/11/11 1610 09/11/11 0400  HGB -- 10.7* -- 10.7* -- --  HCT -- 31.8* -- 32.3* -- 37.2  PLT -- 216 -- 204 -- 223  APTT -- -- -- -- -- --  LABPROT -- -- -- -- -- --  INR -- -- -- -- -- --  HEPARINUNFRC 0.22* 0.29* 0.29* -- -- --  CREATININE -- 1.14* -- 1.06 1.03 --  CKTOTAL -- -- -- -- -- --  CKMB -- -- -- -- -- --  TROPONINI -- -- -- -- -- --   Estimated Creatinine Clearance: 59.4 ml/min (by C-G formula based on Cr of 1.14).  Assessment: 61 YO Female with  suspected ACS s/p Vfib arrest and hypothermia protocol - now rewarmed.  Pt was extubated and re intubated today 2/2 flash pulm edema.  Currently sedated with fentanyl and propofol. PPI for GI px, and mouthcare.  Renal fx stable.    Anticoagulation - Heparin level 0.22 despite heparin rate increase to 1400 uts/hr.  No IV issues noted.  Goal of Therapy:  Heparin level 0.3-0.7 units/ml   Plan:  1. Increase heparin to 1600 units/hr 2. Recheck heparin level in 6hr 3. Daily heparin level and CBC.   [employer], Pharm.D., BCPS Clinical Pharmacist Pager [phone] 09/13/2011 5:05 PM

## 2011-09-13 NOTE — Procedures (Signed)
Extubation Procedure Note  Patient Details:   Name: YARITZI CRAUN DOB: Dec 15, 1950 MRN: [id number]   Airway Documentation:  Airway 7.5 mm (Active)  Secured at (cm) 22 cm 09/13/2011  8:30 AM  Measured From Lips 09/13/2011  8:30 AM  Secured Location Left 09/13/2011  8:30 AM  Secured By [employer] 09/13/2011  8:30 AM  Tube Holder Repositioned Yes 09/13/2011  8:30 AM  Cuff Pressure (cm H2O) 24 cm H2O 09/12/2011  7:50 PM  Site Condition Dry 09/12/2011  8:00 PM     Airway 7 mm (Active)  Secured at (cm) 22 cm 09/13/2011  9:45 AM  Measured From Lips 09/13/2011  9:45 AM  Secured Location Right 09/13/2011  9:45 AM  Secured By [employer] 09/13/2011  9:45 AM    Evaluation  O2 sats: stable throughout Complications: Increased HR, BP, stridor, desaturation Patient did not tolerate procedure well. Bilateral Breath Sounds: Diminished;Rhonchi Suctioning: Airway No  Dorethea Clan 09/13/2011, 10:05 AM

## 2011-09-13 NOTE — Progress Notes (Signed)
[facility]  DAILY PROGRESS NOTE   Subjective:  Events noted overnight, including inadvertent attempt at self-extubation. Low grade fevers.  Objective:  Temp:  [99.3 F (37.4 C)-100.9 F (38.3 C)] 100.4 F (38 C) (04/25 0600) Pulse Rate:  [83-147] 93  (04/25 0700) Resp:  [15-32] 16  (04/25 0700) BP: (91-186)/(44-106) 117/49 mmHg (04/25 0700) SpO2:  [70 %-99 %] 99 % (04/25 0700) Arterial Line BP: (84-283)/(43-143) 105/56 mmHg (04/25 0700) FiO2 (%):  [40 %-100 %] 100 % (04/25 0700) Weight:  [81.1 kg (178 lb 12.7 oz)] 81.1 kg (178 lb 12.7 oz) (04/25 0400) Weight change: -2.7 kg (-5 lb 15.2 oz)  Intake/Output from previous day: 04/24 0701 - 04/25 0700 In: 3368 [I.V.:1101.5; NG/GT:1180; IV Piggyback:1086.5] Out: 5390 [Urine:5390]  Intake/Output from this shift:    Medications: Current Facility-Administered Medications  Medication Dose Route Frequency Provider Last Rate Last Dose  . 0.9 %  sodium chloride infusion   Intravenous Continuous Alyson Reedy, MD 20 mL/hr at 09/13/11 0727    . 0.9 %  sodium chloride infusion   Intravenous Continuous Alyson Reedy, MD 10 mL/hr at 09/12/11 1900    . 0.9 %  sodium chloride infusion   Intravenous Continuous Alyson Reedy, MD 20 mL/hr at 09/11/11 1533    . acetaminophen (TYLENOL) solution 650 mg  650 mg Per Tube Q6H PRN Alyson Reedy, MD   650 mg at 09/12/11 1411  . antiseptic oral rinse (BIOTENE) solution 15 mL  15 mL Mouth Rinse QID Alyson Reedy, MD   15 mL at 09/13/11 0426  . aspirin chewable tablet 81 mg  81 mg Oral Daily Verdene Rio, MD   81 mg at 09/12/11 1127  . carvedilol (COREG) tablet 3.125 mg  3.125 mg Oral NOW Alyson Reedy, MD   3.125 mg at 09/12/11 1025  . carvedilol (COREG) tablet 3.125 mg  3.125 mg Oral BID WC Alyson Reedy, MD   3.125 mg at 09/12/11 1709  . chlorhexidine (PERIDEX) 0.12 % solution 15 mL  15 mL Mouth Rinse BID Alyson Reedy, MD   15 mL at 09/12/11 1932  . dextrose 10 %  infusion   Intravenous Continuous Cidney Hulett, MD      . feeding supplement (PRO-STAT SUGAR FREE 64) liquid 30 mL  30 mL Per Tube BID Ailene Ards, RD   30 mL at 09/12/11 1709  . feeding supplement (PROMOTE) liquid 1,000 mL  1,000 mL Per Tube Continuous Ailene Ards, RD 55 mL/hr at 09/12/11 1936 1,000 mL at 09/12/11 1936  . fentaNYL (SUBLIMAZE) 10 mcg/mL in sodium chloride 0.9 % 250 mL infusion  50-400 mcg/hr Intravenous Titrated Alyson Reedy, MD 7.5 mL/hr at 09/12/11 1840 75 mcg/hr at 09/12/11 1840   And  . fentaNYL (SUBLIMAZE) bolus via infusion 50-100 mcg  50-100 mcg Intravenous Q6H PRN Alyson Reedy, MD      . fentaNYL (SUBLIMAZE) injection 50-100 mcg  50-100 mcg Intravenous Q2H PRN Alyson Reedy, MD   100 mcg at 09/12/11 0755  . folic acid injection 1 mg  1 mg Intravenous Daily Alyson Reedy, MD   1 mg at 09/12/11 1128  . furosemide (LASIX) injection 40 mg  40 mg Intravenous Q8H Alyson Reedy, MD   40 mg at 09/12/11 1710  . heparin ADULT infusion 100 units/mL (25000 units/250 mL)  1,300 Units/hr Intravenous Continuous Crystal Westwood, PHARMD 13 mL/hr at 09/12/11 2230 1,300  Units/hr at 09/12/11 2230  . insulin aspart (novoLOG) injection 0-4 Units  0-4 Units Subcutaneous Q4H Joie Bimler, MD   1 Units at 09/12/11 1356  . magnesium sulfate IVPB 2 g 50 mL  2 g Intravenous Once Alyson Reedy, MD   2 g at 09/12/11 1054  . midazolam (VERSED) injection 2-4 mg  2-4 mg Intravenous Q2H PRN Alyson Reedy, MD   2 mg at 09/12/11 0903  . mulitivitamin liquid 5 mL  5 mL Oral Daily Alyson Reedy, MD   5 mL at 09/12/11 1405  . pantoprazole (PROTONIX) injection 40 mg  40 mg Intravenous QHS Vilinda Blanks Minor, NP   40 mg at 09/12/11 2103  . piperacillin-tazobactam (ZOSYN) IVPB 3.375 g  3.375 g Intravenous Q8H Alyson Reedy, MD   3.375 g at 09/13/11 0327  . potassium chloride 20 MEQ/15ML (10%) liquid 40 mEq  40 mEq Per Tube Once Alyson Reedy, MD   40 mEq at 09/12/11 1127    . potassium phosphate 30 mmol in dextrose 5 % 500 mL infusion  30 mmol Intravenous Once Alyson Reedy, MD   30 mmol at 09/12/11 1103  . propofol (DIPRIVAN) 10 MG/ML infusion  5-50 mcg/kg/min Intravenous Titrated Alyson Reedy, MD 7.5 mL/hr at 09/13/11 0500 15 mcg/kg/min at 09/13/11 0500  . thiamine (B-1) injection 100 mg  100 mg Intravenous Daily Vilinda Blanks Minor, NP   100 mg at 09/12/11 1128  . vancomycin (VANCOCIN) IVPB 1000 mg/200 mL premix  1,000 mg Intravenous Q12H Alyson Reedy, MD   1,000 mg at 09/12/11 2210    Physical Exam: General appearance: opens eyes to voice, does not reliably follow commands Neck: no adenopathy, no carotid bruit, no JVD, supple, symmetrical, trachea midline and thyroid not enlarged, symmetric, no tenderness/mass/nodules Lungs: clear to auscultation bilaterally Heart: regular rate and rhythm, S1, S2 normal, no murmur, click, rub or gallop Abdomen: soft, non-tender; bowel sounds normal; no masses,  no organomegaly Extremities: extremities normal, atraumatic, no cyanosis or edema Pulses: 1+ and symmetric Skin: Skin color, texture, turgor normal. No rashes or lesions Neurologic: Grossly normal  Lab Results: Results for orders placed during the hospital encounter of 09/09/11 (from the past 48 hour(s))  BASIC METABOLIC PANEL     Status: Abnormal   Collection Time   09/11/11  8:00 AM      Component Value Range Comment   Sodium 140  135 - 145 (mEq/L)    Potassium 3.9  3.5 - 5.1 (mEq/L)    Chloride 111  96 - 112 (mEq/L)    CO2 20  19 - 32 (mEq/L)    Glucose, Bld 113 (*) 70 - 99 (mg/dL)    BUN 5 (*) 6 - 23 (mg/dL)    Creatinine, Ser 1.61  0.50 - 1.10 (mg/dL)    Calcium 8.5  8.4 - 10.5 (mg/dL)    GFR calc non Af Amer 69 (*) >90 (mL/min)    GFR calc Af Amer 80 (*) >90 (mL/min)   BASIC METABOLIC PANEL     Status: Abnormal   Collection Time   09/11/11 12:00 PM      Component Value Range Comment   Sodium 141  135 - 145 (mEq/L)    Potassium 3.6  3.5 - 5.1  (mEq/L)    Chloride 111  96 - 112 (mEq/L)    CO2 20  19 - 32 (mEq/L)    Glucose, Bld 103 (*) 70 - 99 (mg/dL)  BUN 6  6 - 23 (mg/dL)    Creatinine, Ser 1.61  0.50 - 1.10 (mg/dL)    Calcium 8.5  8.4 - 10.5 (mg/dL)    GFR calc non Af Amer 63 (*) >90 (mL/min)    GFR calc Af Amer 73 (*) >90 (mL/min)   GLUCOSE, CAPILLARY     Status: Abnormal   Collection Time   09/11/11 12:12 PM      Component Value Range Comment   Glucose-Capillary 108 (*) 70 - 99 (mg/dL)   HEPARIN LEVEL (UNFRACTIONATED)     Status: Abnormal   Collection Time   09/11/11 12:35 PM      Component Value Range Comment   Heparin Unfractionated 0.19 (*) 0.30 - 0.70 (IU/mL)   GLUCOSE, CAPILLARY     Status: Normal   Collection Time   09/11/11  3:58 PM      Component Value Range Comment   Glucose-Capillary 94  70 - 99 (mg/dL)   BASIC METABOLIC PANEL     Status: Abnormal   Collection Time   09/11/11  4:10 PM      Component Value Range Comment   Sodium 141  135 - 145 (mEq/L)    Potassium 3.6  3.5 - 5.1 (mEq/L)    Chloride 111  96 - 112 (mEq/L)    CO2 21  19 - 32 (mEq/L)    Glucose, Bld 103 (*) 70 - 99 (mg/dL)    BUN 6  6 - 23 (mg/dL)    Creatinine, Ser 0.96  0.50 - 1.10 (mg/dL)    Calcium 8.3 (*) 8.4 - 10.5 (mg/dL)    GFR calc non Af Amer 58 (*) >90 (mL/min)    GFR calc Af Amer 68 (*) >90 (mL/min)   GLUCOSE, CAPILLARY     Status: Normal   Collection Time   09/11/11  7:20 PM      Component Value Range Comment   Glucose-Capillary 90  70 - 99 (mg/dL)    Comment 1 Documented in Chart      Comment 2 Notify RN     HEPARIN LEVEL (UNFRACTIONATED)     Status: Abnormal   Collection Time   09/11/11 10:09 PM      Component Value Range Comment   Heparin Unfractionated 0.19 (*) 0.30 - 0.70 (IU/mL)   CULTURE, RESPIRATORY     Status: Normal (Preliminary result)   Collection Time   09/11/11 11:27 PM      Component Value Range Comment   Specimen Description TRACHEAL ASPIRATE      Special Requests Normal      Gram Stain        Value:  MODERATE WBC PRESENT,BOTH PMN AND MONONUCLEAR     NO SQUAMOUS EPITHELIAL CELLS SEEN     FEW GRAM POSITIVE COCCI IN PAIRS     IN CLUSTERS   Culture NO GROWTH      Report Status PENDING     GLUCOSE, CAPILLARY     Status: Abnormal   Collection Time   09/11/11 11:54 PM      Component Value Range Comment   Glucose-Capillary 108 (*) 70 - 99 (mg/dL)    Comment 1 Documented in Chart      Comment 2 Notify RN     GLUCOSE, CAPILLARY     Status: Abnormal   Collection Time   09/12/11  3:25 AM      Component Value Range Comment   Glucose-Capillary 103 (*) 70 - 99 (mg/dL)    Comment 1  Documented in Chart      Comment 2 Notify RN     CBC     Status: Abnormal   Collection Time   09/12/11  4:00 AM      Component Value Range Comment   WBC 10.2  4.0 - 10.5 (K/uL)    RBC 3.16 (*) 3.87 - 5.11 (MIL/uL)    Hemoglobin 10.7 (*) 12.0 - 15.0 (g/dL)    HCT 16.1 (*) 09.6 - 46.0 (%)    MCV 102.2 (*) 78.0 - 100.0 (fL)    MCH 33.9  26.0 - 34.0 (pg)    MCHC 33.1  30.0 - 36.0 (g/dL)    RDW 04.5  40.9 - 81.1 (%)    Platelets 204  150 - 400 (K/uL)   HEPATIC FUNCTION PANEL     Status: Abnormal   Collection Time   09/12/11  4:00 AM      Component Value Range Comment   Total Protein 6.0  6.0 - 8.3 (g/dL)    Albumin 2.5 (*) 3.5 - 5.2 (g/dL)    AST 38 (*) 0 - 37 (U/L)    ALT 53 (*) 0 - 35 (U/L)    Alkaline Phosphatase 110  39 - 117 (U/L)    Total Bilirubin 0.7  0.3 - 1.2 (mg/dL)    Bilirubin, Direct 0.2  0.0 - 0.3 (mg/dL)    Indirect Bilirubin 0.5  0.3 - 0.9 (mg/dL)   PRO B NATRIURETIC PEPTIDE     Status: Abnormal   Collection Time   09/12/11  4:00 AM      Component Value Range Comment   Pro B Natriuretic peptide (BNP) 638.4 (*) 0 - 125 (pg/mL)   MAGNESIUM     Status: Normal   Collection Time   09/12/11  4:00 AM      Component Value Range Comment   Magnesium 1.8  1.5 - 2.5 (mg/dL)   PHOSPHORUS     Status: Abnormal   Collection Time   09/12/11  4:00 AM      Component Value Range Comment   Phosphorus 2.2 (*)  2.3 - 4.6 (mg/dL)   BASIC METABOLIC PANEL     Status: Abnormal   Collection Time   09/12/11  4:00 AM      Component Value Range Comment   Sodium 141  135 - 145 (mEq/L)    Potassium 3.2 (*) 3.5 - 5.1 (mEq/L)    Chloride 110  96 - 112 (mEq/L)    CO2 22  19 - 32 (mEq/L)    Glucose, Bld 102 (*) 70 - 99 (mg/dL)    BUN 11  6 - 23 (mg/dL)    Creatinine, Ser 9.14  0.50 - 1.10 (mg/dL)    Calcium 8.6  8.4 - 10.5 (mg/dL)    GFR calc non Af Amer 56 (*) >90 (mL/min)    GFR calc Af Amer 65 (*) >90 (mL/min)   BLOOD GAS, ARTERIAL     Status: Normal   Collection Time   09/12/11  5:00 AM      Component Value Range Comment   FIO2 0.40      Delivery systems VENTILATOR      Mode PRESSURE REGULATED VOLUME CONTROL      VT 500      Rate 15      Peep/cpap 5.0      pH, Arterial 7.399  7.350 - 7.400     pCO2 arterial 37.1  35.0 - 45.0 (mmHg)    pO2, Arterial  96.8  80.0 - 100.0 (mmHg)    Bicarbonate 22.3  20.0 - 24.0 (mEq/L)    TCO2 23.4  0 - 100 (mmol/L)    Acid-base deficit 1.7  0.0 - 2.0 (mmol/L)    O2 Saturation 98.0      Patient temperature 99.3      Collection site A-LINE      Drawn by [phone]      Sample type ARTERIAL DRAW     HEPARIN LEVEL (UNFRACTIONATED)     Status: Normal   Collection Time   09/12/11  8:00 AM      Component Value Range Comment   Heparin Unfractionated 0.43  0.30 - 0.70 (IU/mL)   GLUCOSE, CAPILLARY     Status: Normal   Collection Time   09/12/11  8:10 AM      Component Value Range Comment   Glucose-Capillary 89  70 - 99 (mg/dL)   GLUCOSE, CAPILLARY     Status: Abnormal   Collection Time   09/12/11 12:25 PM      Component Value Range Comment   Glucose-Capillary 126 (*) 70 - 99 (mg/dL)   URINALYSIS, ROUTINE W REFLEX MICROSCOPIC     Status: Abnormal   Collection Time   09/12/11  1:35 PM      Component Value Range Comment   Color, Urine YELLOW  YELLOW     APPearance CLEAR  CLEAR     Specific Gravity, Urine 1.008  1.005 - 1.030     pH 6.0  5.0 - 8.0     Glucose, UA  NEGATIVE  NEGATIVE (mg/dL)    Hgb urine dipstick SMALL (*) NEGATIVE     Bilirubin Urine NEGATIVE  NEGATIVE     Ketones, ur NEGATIVE  NEGATIVE (mg/dL)    Protein, ur NEGATIVE  NEGATIVE (mg/dL)    Urobilinogen, UA 1.0  0.0 - 1.0 (mg/dL)    Nitrite NEGATIVE  NEGATIVE     Leukocytes, UA NEGATIVE  NEGATIVE    URINE MICROSCOPIC-ADD ON     Status: Normal   Collection Time   09/12/11  1:35 PM      Component Value Range Comment   Squamous Epithelial / LPF RARE  RARE     RBC / HPF 3-6  <3 (RBC/hpf)    Bacteria, UA RARE  RARE    HEPARIN LEVEL (UNFRACTIONATED)     Status: Abnormal   Collection Time   09/12/11  2:00 PM      Component Value Range Comment   Heparin Unfractionated 0.29 (*) 0.30 - 0.70 (IU/mL)   GLUCOSE, CAPILLARY     Status: Abnormal   Collection Time   09/12/11  4:05 PM      Component Value Range Comment   Glucose-Capillary 117 (*) 70 - 99 (mg/dL)    Comment 1 Notify RN      Comment 2 Documented in Chart     GLUCOSE, CAPILLARY     Status: Abnormal   Collection Time   09/12/11  7:51 PM      Component Value Range Comment   Glucose-Capillary 116 (*) 70 - 99 (mg/dL)   GLUCOSE, CAPILLARY     Status: Abnormal   Collection Time   09/13/11 12:01 AM      Component Value Range Comment   Glucose-Capillary 107 (*) 70 - 99 (mg/dL)   POCT I-STAT 3, BLOOD GAS (G3+)     Status: Abnormal   Collection Time   09/13/11  3:50 AM      Component Value  Range Comment   pH, Arterial 7.434 (*) 7.350 - 7.400     pCO2 arterial 40.7  35.0 - 45.0 (mmHg)    pO2, Arterial 66.0 (*) 80.0 - 100.0 (mmHg)    Bicarbonate 27.2 (*) 20.0 - 24.0 (mEq/L)    TCO2 28  0 - 100 (mmol/L)    O2 Saturation 93.0      Acid-Base Excess 3.0 (*) 0.0 - 2.0 (mmol/L)    Patient temperature 99.7 F      Collection site ARTERIAL LINE      Drawn by Operator      Sample type ARTERIAL     GLUCOSE, CAPILLARY     Status: Abnormal   Collection Time   09/13/11  3:54 AM      Component Value Range Comment   Glucose-Capillary 118 (*) 70  - 99 (mg/dL)   CBC     Status: Abnormal   Collection Time   09/13/11  4:00 AM      Component Value Range Comment   WBC 10.3  4.0 - 10.5 (K/uL)    RBC 3.19 (*) 3.87 - 5.11 (MIL/uL)    Hemoglobin 10.7 (*) 12.0 - 15.0 (g/dL)    HCT 62.9 (*) 52.8 - 46.0 (%)    MCV 99.7  78.0 - 100.0 (fL)    MCH 33.5  26.0 - 34.0 (pg)    MCHC 33.6  30.0 - 36.0 (g/dL)    RDW 41.3  24.4 - 01.0 (%)    Platelets 216  150 - 400 (K/uL)   BASIC METABOLIC PANEL     Status: Abnormal   Collection Time   09/13/11  4:00 AM      Component Value Range Comment   Sodium 138  135 - 145 (mEq/L)    Potassium 3.3 (*) 3.5 - 5.1 (mEq/L)    Chloride 103  96 - 112 (mEq/L)    CO2 26  19 - 32 (mEq/L)    Glucose, Bld 115 (*) 70 - 99 (mg/dL)    BUN 15  6 - 23 (mg/dL)    Creatinine, Ser 2.72 (*) 0.50 - 1.10 (mg/dL)    Calcium 8.9  8.4 - 10.5 (mg/dL)    GFR calc non Af Amer 52 (*) >90 (mL/min)    GFR calc Af Amer 60 (*) >90 (mL/min)   HEPARIN LEVEL (UNFRACTIONATED)     Status: Abnormal   Collection Time   09/13/11  4:00 AM      Component Value Range Comment   Heparin Unfractionated 0.29 (*) 0.30 - 0.70 (IU/mL)   MAGNESIUM     Status: Normal   Collection Time   09/13/11  4:00 AM      Component Value Range Comment   Magnesium 2.2  1.5 - 2.5 (mg/dL)   PHOSPHORUS     Status: Normal   Collection Time   09/13/11  4:00 AM      Component Value Range Comment   Phosphorus 2.7  2.3 - 4.6 (mg/dL)     Imaging: Dg Chest Port 1 View  09/13/2011  *RADIOLOGY REPORT*  Clinical Data: 61 year old female.  Cardiac arrest, respiratory failure.  PORTABLE CHEST - 1 VIEW  Comparison: 09/12/2011 and earlier.  Findings: Stable endotracheal tube tip at the level of clavicles. Enteric tube terminates in the left upper quadrant.  Stable left IJ central line. The patient is more rotated to the left.  Retrocardiac consolidation has not significantly changed.  Mildly decreased patchy and veiling opacity in the right lung.  No  pneumothorax or edema.  Probable  small right effusion.  IMPRESSION: 1. Stable lines and tubes. 2.  Left lower lobe consolidation is stable. 3.  Mildly improved ventilation in the right lung where there is patchy and confluent perihilar and basilar opacity.  Original Report Authenticated By: Harley Hallmark, M.D.   Dg Chest Port 1 View  09/12/2011  *RADIOLOGY REPORT*  Clinical Data: Shortness of breath.  Intubated patient.  PORTABLE CHEST - 1 VIEW  Comparison: Chest 09/10/2011 and 09/11/2011.  Findings: Support apparatus is unchanged.  There has been marked worsening in airspace disease throughout the right chest.  Dense left basilar opacity is also identified.  No pneumothorax.  IMPRESSION: Marked worsening in airspace disease on the right.  Left basilar airspace opacity also shows some increase.  Original Report Authenticated By: Bernadene Bell. Maricela Curet, M.D.   Ct Portable Head W/o Cm  09/11/2011  *RADIOLOGY REPORT*  Clinical Data: Cardiac arrest, question of anoxic injury  CT HEAD WITHOUT CONTRAST  Technique:  Contiguous axial images were obtained from the base of the skull through the vertex without contrast.  Comparison: 09/09/2011  Findings: Endotracheal and nasogastric tubes partly visualized. Allowing for differences in orientation and scan plane, there is no change in ventricular diameter. No acute hemorrhage, acute infarction, or mass lesion is seen.  No midline shift.  Orbits and paranasal sinuses are intact.  IMPRESSION: Allowing for suboptimal visualization of portable technique, no gross evidence for anoxic brain injury or acute intracranial finding.  Original Report Authenticated By: Harrel Lemon, M.D.    Assessment:  1. Active Problems: 2.  Cardiac arrest - ventricular fibrillation, with [facility] Protocol 3.  Acute respiratory failure with hypoxia 4.  HIE (hypoxic-ischemic encephalopathy) 5.  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  MR (mitral regurgitation),  moderate 8.   Plan:  1.  Opens eyes to voice, however, not following commands but on propofol low dose. Attempted self-extubation last night. Severe cardiomyopathy with global hypokinesis - most likely non-ischemic, but agree with LHC at some point after extubation and pending meaningful neurologic recovery. Still having low grade (?central) fevers. Will need to replete K+ today (3.3).   Time Spent Directly with Patient:  15 minutes  Length of Stay:  LOS: 4 days   Chrystie Nose, MD, [facility] Attending Cardiologist [facility]  Gorge Almanza C 09/13/2011, 7:42 AM

## 2011-09-13 NOTE — Progress Notes (Addendum)
Pt was extubated @ 0915 and placed on Venturi Mask of 50%. Pt's SPO2 remained around 85-89%. Pt had stridor and pt was reintubated with 7.0 ET tube and placed back on the ventilator. RT will continue to monitor.

## 2011-09-13 NOTE — Progress Notes (Signed)
Order to withdrawal ETT 2 cm, 22 to 20 done per Dr. Ignacia Marvel.

## 2011-09-13 NOTE — Progress Notes (Signed)
RT called to room due to pt grabbing ETT. When I entered room pt had not pulled ETT out but had hand fully around the tube. Pt bagged until SpO2 into the mid 90s. Pt placed on 100% fio2 at this time.

## 2011-09-13 NOTE — Progress Notes (Signed)
Name: Dawn Cruz MRN: [id number] DOB: [birth year]    LOS: 4  PCCM Progress NOTE  History of Present Illness: 61 yo aaf who complained of chest pain and was witnessed to pass out. EMS response time 10 minutes. Found to be in Vfib, shocked x 2, epi x 2 and brief chest compression. Transported to ED, intubated, coughing on vent prior to sedation and NMB for hypothermia protocol.  PCCM to admit.    Vital Signs: Temp:  [99.1 F (37.3 C)-100.9 F (38.3 C)] 99.1 F (37.3 C) (04/25 0800) Pulse Rate:  [83-147] 99  (04/25 0830) Resp:  [15-32] 21  (04/25 0830) BP: (91-170)/(44-84) 170/84 mmHg (04/25 0830) SpO2:  [70 %-99 %] 93 % (04/25 0830) Arterial Line BP: (84-283)/(46-143) 200/100 mmHg (04/25 0800) FiO2 (%):  [40 %-100 %] 100 % (04/25 0945) Weight:  [81.1 kg (178 lb 12.7 oz)] 81.1 kg (178 lb 12.7 oz) (04/25 0400) I/O last 3 completed shifts: In: 4274.4 [I.V.:1577.9; NG/GT:1600; IV Piggyback:1096.5] Out: 5992 [Urine:5992]  Lines, Tubes, etc: 4/21 ETT>> 4/25 ET tube 4/25>>> 4/21 LT IJ CVL>>  4/21 A Line>>  Microbiology: 4/21 BCx x 2>> NGTD 4/21 Resp cx>>GPC in pairs. 4/21 Urine cx>>NG 4/21 MRSA screen>>neg Blood 4/24>>> Sputum 4/24>>>  Antibiotics:  Vancomycin 4/24>>> Zosyn 4/24>>>  Studies/Events: 4/21 VF arrest  4/21 CT head>>> NAP 4/21 2D echo>>> Systolic function was severely reduced. The estimated ejection fraction was in the range of 10% to 20%. Severe diffuse hypokinesis.  Moderate MV regurg.  Consults:  4/22 Cardiology  Subjective: More alert and interactive this AM, extubated but failed due to stridor and flash pulmonary edema requiring reintubation.  Physical Examination: VItal signs reviewed . GEN: Pt appears critically ill.  Intubated and sedated. HEENT: head is autraumatic and normocephalic.   PERRL.  Sclerae anicteric.  Conjunctivae without pallor or injection. ETT in place. RESP:  Coarse sounds noted throughout bilateral lung fields. No  wheezing. CARDIOVASCULAR: regular rate, normal rhythm.  Clear S1, S2. ABDOMEN: soft, non-tender, non-distended.  Bowels sounds present in all quadrants and normoactive.  No palpable masses. EXT: warm and dry.  Peripheral pulses equal, intact, and +2 globally.  No clubbing or cyanosis.  Trace edema in bilateral lower extremities. NEURO: intubated and sedated. Corneals intact.  Not following commands. Does not withdraw to pain.  Babinski's normal bilaterally.    Intake/Output Summary (Last 24 hours) at 09/13/11 1026 Last data filed at 09/13/11 1000  Gross per 24 hour  Intake 3414.44 ml  Output   5365 ml  Net -1950.56 ml   Ventilator settings: Vent Mode:  [-] PRVC FiO2 (%):  [40 %-100 %] 100 % Set Rate:  [15 bmp-22 bmp] 22 bmp Vt Set:  [500 mL] 500 mL PEEP:  [5 cmH20] 5 cmH20 Plateau Pressure:  [0 cmH20-19 cmH20] 19 cmH20  Lab Results: Basic Metabolic Panel:  Lab 09/13/11 6962 09/12/11 0400  NA 138 141  K 3.3* 3.2*  CL 103 110  CO2 26 22  GLUCOSE 115* 102*  BUN 15 11  CREATININE 1.14* 1.06  CALCIUM 8.9 8.6  MG 2.2 1.8  PHOS 2.7 2.2*   Liver Function Tests:  Lab 09/12/11 0400 09/09/11 1213  AST 38* 253*  ALT 53* 138*  ALKPHOS 110 119*  BILITOT 0.7 0.6  PROT 6.0 7.2  ALBUMIN 2.5* 3.7   CBC:  Lab 09/13/11 0400 09/12/11 0400 09/09/11 1213  WBC 10.3 10.2 --  NEUTROABS -- -- 3.0  HGB 10.7* 10.7* --  HCT 31.8* 32.3* --  MCV 99.7 102.2* --  PLT 216 204 --   Cardiac Enzymes:  Lab 09/10/11 0350 09/09/11 2130 09/09/11 1354  CKTOTAL 1676* 996* 167  CKMB 15.5* 11.7* 2.3  CKMBINDEX -- -- --  TROPONINI 0.58* 0.95* <0.30   CBG:  Lab 09/13/11 0800 09/13/11 0354 09/13/11 0001 09/12/11 1951 09/12/11 1605 09/12/11 1225  GLUCAP 161* 118* 107* 116* 117* 126*   Coagulation:  Lab 09/09/11 1940 09/09/11 1213  LABPROT 15.1 13.4  INR 1.17 1.00   Urine Drug Screen: Drugs of Abuse     Component Value Date/Time   LABOPIA NONE DETECTED 09/09/2011 1513   COCAINSCRNUR  POSITIVE* 09/09/2011 1513   LABBENZ POSITIVE* 09/09/2011 1513   AMPHETMU NONE DETECTED 09/09/2011 1513   THCU NONE DETECTED 09/09/2011 1513   LABBARB NONE DETECTED 09/09/2011 1513    Urinalysis:  Lab 09/12/11 1335 09/09/11 1254  COLORURINE YELLOW YELLOW  LABSPEC 1.008 1.012  PHURINE 6.0 6.0  GLUCOSEU NEGATIVE 500*  HGBUR SMALL* SMALL*  BILIRUBINUR NEGATIVE NEGATIVE  KETONESUR NEGATIVE NEGATIVE  PROTEINUR NEGATIVE 100*  UROBILINOGEN 1.0 0.2  NITRITE NEGATIVE NEGATIVE  LEUKOCYTESUR NEGATIVE NEGATIVE     Labs and Imaging:   Dg Chest Port 1 View  09/13/2011  *RADIOLOGY REPORT*  Clinical Data: 61 year old female.  Cardiac arrest, respiratory failure.  PORTABLE CHEST - 1 VIEW  Comparison: 09/12/2011 and earlier.  Findings: Stable endotracheal tube tip at the level of clavicles. Enteric tube terminates in the left upper quadrant.  Stable left IJ central line. The patient is more rotated to the left.  Retrocardiac consolidation has not significantly changed.  Mildly decreased patchy and veiling opacity in the right lung.  No pneumothorax or edema.  Probable small right effusion.  IMPRESSION: 1. Stable lines and tubes. 2.  Left lower lobe consolidation is stable. 3.  Mildly improved ventilation in the right lung where there is patchy and confluent perihilar and basilar opacity.  Original Report Authenticated By: Harley Hallmark, M.D.   Dg Chest Port 1 View  09/12/2011  *RADIOLOGY REPORT*  Clinical Data: Shortness of breath.  Intubated patient.  PORTABLE CHEST - 1 VIEW  Comparison: Chest 09/10/2011 and 09/11/2011.  Findings: Support apparatus is unchanged.  There has been marked worsening in airspace disease throughout the right chest.  Dense left basilar opacity is also identified.  No pneumothorax.  IMPRESSION: Marked worsening in airspace disease on the right.  Left basilar airspace opacity also shows some increase.  Original Report Authenticated By: Bernadene Bell. Maricela Curet, M.D.   Ct Portable Head W/o  Cm  09/11/2011  *RADIOLOGY REPORT*  Clinical Data: Cardiac arrest, question of anoxic injury  CT HEAD WITHOUT CONTRAST  Technique:  Contiguous axial images were obtained from the base of the skull through the vertex without contrast.  Comparison: 09/09/2011  Findings: Endotracheal and nasogastric tubes partly visualized. Allowing for differences in orientation and scan plane, there is no change in ventricular diameter. No acute hemorrhage, acute infarction, or mass lesion is seen.  No midline shift.  Orbits and paranasal sinuses are intact.  IMPRESSION: Allowing for suboptimal visualization of portable technique, no gross evidence for anoxic brain injury or acute intracranial finding.  Original Report Authenticated By: Harrel Lemon, M.D.   Assessment and Plan:  VDRF 2/2 VF arrest:  VF arrest likely the result of cocaine use.  2D echo reveals severely reduced LV function with EF 10-20%.  Extubated after a successful trial but developed stridor and flash pulmonary edema requiring reintubation. - Full vent support after  re-intubation. - Heparin gtt. - Continue ASA. - Lopressor d/c'd 2/2 cocaine use will start carvedilol today, will increase to 6.25. - Cardiology following, plan to proceed with cath once mental status is accurately assessed.  Hypokalemia: Mag low. - Continue to replete with goal K of 4.0 and Mg with F/U lytes in AM.  Cocaine use: UDS positive for cocaine.  EtOH also probable given MCV > 100 and AST:ALT roughly 2:1. - Consider SW consult if patient has meaningful recovery - Low dose carvedilol today.  Elevated LFTs: AST:ALT elevated at 2:1 split suggesting EtOH use; hypotension and "shock liver" also likely contributing to abnormal liver enzymes likely due to ETOH. - Repeat LFTs in am. - Consider statin if LFTs normalize. - Continue TF.  Best Practice: DVT: on full dose heparin SUP: protonix Nutrition: NPO Glycemic control: SSI Sedation/analgesia: Propofol/Fentanyl  CC  time 45 minutes.  Koren Bound, M.D. [phone]

## 2011-09-13 NOTE — Procedures (Signed)
Intubation Procedure Note Dawn Cruz [id number] [birth year]  Procedure: Intubation Indications: Airway protection and maintenance  Procedure Details Consent: Unable to obtain consent because of emergent medical necessity. Time Out: Verified patient identification, verified procedure, site/side was marked, verified correct patient position, special equipment/implants available, medications/allergies/relevent history reviewed, required imaging and test results available.  Performed  Maximum sterile technique was used including cap, gloves, hand hygiene and mask.  MAC    Evaluation Hemodynamic Status: BP stable throughout; O2 sats: stable throughout Patient's Current Condition: stable Complications: No complications. Patient did tolerate procedure well. Chest X-ray ordered to verify placement.  CXR: pending.   Dawn Cruz 09/13/2011

## 2011-09-13 NOTE — Progress Notes (Signed)
ANTICOAGULATION CONSULT NOTE - Follow-Up Consult  Pharmacy Consult for heparin Indication:  chest pain/ACS/hypothermia protocol  No Known Allergies  Patient Measurements: Height: 5\' 8"  (172.7 cm) Weight: 178 lb 12.7 oz (81.1 kg) IBW/kg (Calculated) : 63.9  Heparin Dosing Weight: 75kg  Vital Signs: Temp: 100.4 F (38 C) (04/25 0600) Temp src: Core (Comment) (04/25 0600) BP: 170/84 mmHg (04/25 0830) Pulse Rate: 99  (04/25 0830)  Labs:  Basename 09/13/11 0400 09/12/11 1400 09/12/11 0800 09/12/11 0400 09/11/11 1610 09/11/11 0400  HGB 10.7* -- -- 10.7* -- --  HCT 31.8* -- -- 32.3* -- 37.2  PLT 216 -- -- 204 -- 223  APTT -- -- -- -- -- --  LABPROT -- -- -- -- -- --  INR -- -- -- -- -- --  HEPARINUNFRC 0.29* 0.29* 0.43 -- -- --  CREATININE 1.14* -- -- 1.06 1.03 --  CKTOTAL -- -- -- -- -- --  CKMB -- -- -- -- -- --  TROPONINI -- -- -- -- -- --   Estimated Creatinine Clearance: 59.4 ml/min (by C-G formula based on Cr of 1.14).  Assessment: 61 YO Female with  suspected ACS s/p Vfib arrest/hypothermia protocol - now rewarmed.  She remains intubated/sedated with fentanyl and propofol. PPI for GI px, and mouthcare.  BP bounces btw 90/50 to 170/80 no pressors.  CBG stable.  Low K replace by MD noted.  Renal fx stable.    Anticoagulation - Heparin level 0.29 on heparin drip 1300 uts/hr.  H/h lower than admit but stable, PLTC stable.  No bleeding noted.    B/L pna right improving more than left.  Tm 100.8 WBC 10 Day #2 vanc/zosyn - will check Vanc trough with dose 4-5  Goal of Therapy:  Heparin level 0.3-0.7 units/ml Vanco trough 15-20   Plan:  1. Increase heparin to 1400 units/hr 2.  Recheck HL in 6hr 3. Daily HL cbc   Leota Sauers Pharm.D. CPP, BCPS Clinical Pharmacist [phone] 09/13/2011 8:48 AM

## 2011-09-14 ENCOUNTER — Inpatient Hospital Stay (HOSPITAL_COMMUNITY): Payer: BC Managed Care – PPO

## 2011-09-14 DIAGNOSIS — E876 Hypokalemia: Secondary | ICD-10-CM | POA: Diagnosis not present

## 2011-09-14 DIAGNOSIS — N289 Disorder of kidney and ureter, unspecified: Secondary | ICD-10-CM | POA: Diagnosis not present

## 2011-09-14 LAB — CBC
MCH: 33.9 pg (ref 26.0–34.0)
MCHC: 34.2 g/dL (ref 30.0–36.0)
Platelets: 232 10*3/uL (ref 150–400)
RDW: 14.2 % (ref 11.5–15.5)

## 2011-09-14 LAB — GLUCOSE, CAPILLARY
Glucose-Capillary: 113 mg/dL — ABNORMAL HIGH (ref 70–99)
Glucose-Capillary: 127 mg/dL — ABNORMAL HIGH (ref 70–99)
Glucose-Capillary: 121 mg/dL — ABNORMAL HIGH (ref 70–99)
Glucose-Capillary: 133 mg/dL — ABNORMAL HIGH (ref 70–99)

## 2011-09-14 LAB — POCT I-STAT 3, ART BLOOD GAS (G3+)
Bicarbonate: 27.9 mEq/L — ABNORMAL HIGH (ref 20.0–24.0)
TCO2: 29 mmol/L (ref 0–100)
pCO2 arterial: 39.2 mmHg (ref 35.0–45.0)
pH, Arterial: 7.461 — ABNORMAL HIGH (ref 7.350–7.400)
pO2, Arterial: 51 mmHg — ABNORMAL LOW (ref 80.0–100.0)

## 2011-09-14 LAB — BASIC METABOLIC PANEL
Calcium: 9.2 mg/dL (ref 8.4–10.5)
GFR calc Af Amer: 40 mL/min — ABNORMAL LOW (ref >90)
GFR calc non Af Amer: 34 mL/min — ABNORMAL LOW (ref >90)
Glucose, Bld: 117 mg/dL — ABNORMAL HIGH (ref 70–99)
Sodium: 138 mEq/L (ref 135–145)

## 2011-09-14 LAB — CULTURE, RESPIRATORY W GRAM STAIN: Special Requests: NORMAL

## 2011-09-14 MED ORDER — PANTOPRAZOLE SODIUM 40 MG PO PACK
40.0000 mg | PACK | Freq: Every day | ORAL | Status: DC
Start: 2011-09-14 — End: 2011-09-17
  Administered 2011-09-14 – 2011-09-15 (×2): 40 mg
  Filled 2011-09-14 (×4): qty 20

## 2011-09-14 MED ORDER — SIMVASTATIN 5 MG PO TABS
5.0000 mg | ORAL_TABLET | Freq: Every day | ORAL | Status: DC
  Administered 2011-09-14 – 2011-09-19 (×7): 5 mg via ORAL
  Filled 2011-09-14 (×8): qty 1

## 2011-09-14 MED ORDER — FUROSEMIDE 10 MG/ML IJ SOLN
40.0000 mg | Freq: Four times a day (QID) | INTRAMUSCULAR | Status: AC
  Administered 2011-09-14 (×3): 40 mg via INTRAVENOUS
  Filled 2011-09-14 (×3): qty 4

## 2011-09-14 MED ORDER — POTASSIUM CHLORIDE 20 MEQ/15ML (10%) PO LIQD
40.0000 meq | Freq: Three times a day (TID) | ORAL | Status: AC
  Administered 2011-09-14 (×2): 40 meq
  Filled 2011-09-14 (×2): qty 30

## 2011-09-14 MED ORDER — HYDRALAZINE HCL 20 MG/ML IJ SOLN
10.0000 mg | INTRAMUSCULAR | Status: DC | PRN
  Administered 2011-09-16: 10 mg via INTRAVENOUS
  Filled 2011-09-14: qty 1

## 2011-09-14 MED ORDER — NAFCILLIN SODIUM 2 G IJ SOLR
2.0000 g | INTRAVENOUS | Status: DC
  Administered 2011-09-14 – 2011-09-16 (×13): 2 g via INTRAVENOUS
  Filled 2011-09-14 (×19): qty 2000

## 2011-09-14 MED ORDER — POTASSIUM CHLORIDE 20 MEQ/15ML (10%) PO LIQD
ORAL | Status: AC
  Administered 2011-09-14: 11:00:00
  Filled 2011-09-14: qty 30

## 2011-09-14 MED ORDER — HEPARIN SODIUM (PORCINE) 5000 UNIT/ML IJ SOLN
5000.0000 [IU] | Freq: Three times a day (TID) | INTRAMUSCULAR | Status: DC
  Administered 2011-09-14 – 2011-09-20 (×18): 5000 [IU] via SUBCUTANEOUS
  Filled 2011-09-14 (×21): qty 1

## 2011-09-14 MED ORDER — POTASSIUM CHLORIDE CRYS ER 20 MEQ PO TBCR: 40.0000 meq | EXTENDED_RELEASE_TABLET | Freq: Once | ORAL | Status: DC

## 2011-09-14 MED ORDER — NAFCILLIN SODIUM 2 G IJ SOLR
2.0000 g | INTRAMUSCULAR | Status: DC
  Filled 2011-09-14 (×7): qty 2000

## 2011-09-14 NOTE — Progress Notes (Signed)
Name: Dawn Cruz MRN: [id number] DOB: 19-Nov-1950    LOS: 5  PCCM Progress NOTE  History of Present Illness: 61 yo aaf who complained of chest pain and was witnessed to pass out. EMS response time 10 minutes. Found to be in Vfib, shocked x 2, epi x 2 and brief chest compression. Transported to ED, intubated, coughing on vent prior to sedation and NMB for hypothermia protocol.  PCCM to admit.    Vital Signs: Temp:  [98.6 F (37 C)-101 F (38.3 C)] 98.6 F (37 C) (04/26 0720) Pulse Rate:  [70-107] 89  (04/26 0720) Resp:  [18-26] 18  (04/26 0800) BP: (63-158)/(40-94) 110/66 mmHg (04/26 0800) SpO2:  [92 %-99 %] 99 % (04/26 0800) Arterial Line BP: (73-172)/(42-89) 146/79 mmHg (04/26 0715) FiO2 (%):  [40 %-100 %] 50 % (04/26 0800) Weight:  [175 lb 0.7 oz (79.4 kg)] 175 lb 0.7 oz (79.4 kg) (04/26 0446) I/O last 3 completed shifts: In: 5028.5 [I.V.:1788; WU/JW:1191; IV Piggyback:1315.5] Out: 5951 [Urine:5950; Stool:1]  Lines, Tubes, etc: 4/21 ETT>> 4/25 ET tube 4/25>>> 4/21 LT IJ CVL>>  4/21 A Line>>  Microbiology: 4/21 BCx x 2>> NGTD 4/21 Urine cx>> NG 4/21 MRSA screen>>neg 4/23 Resp cx >> MSSA (resistant to PCN, intermediate resistance to erythro) 4/24 Blood x 2 >> NGTD 4/24 Sputum cx >> reincubated 4/24 Urine cx>> NG  Antibiotics:  Vancomycin 4/24>>> 4/26 Zosyn 4/24>>> 4/26 Nafcillin 4/26 >>> (anticipated stop date 4/31)  Studies/Events: 4/21 VF arrest  4/21 CT head>>> NAP 4/21 2D echo>>> Systolic function was severely reduced. The estimated ejection fraction was in the range of 10% to 20%. Severe diffuse hypokinesis.  Moderate MV regurg.  4/23: EEG >> 4/25: severe agitation following extubation; re-intubated for AMS/airwar protection  Consults:  4/22 Cardiology  Subjective:  Alert and interactive this am but remaining calm on versed gtt.  Able to follow commands.  Physical Examination: VItal signs reviewed . GEN: Pt appears critically ill.  Intubated and  sedated. HEENT: head is autraumatic and normocephalic.   PERRL.  Sclerae anicteric.  Conjunctivae without pallor or injection. ETT in place. RESP:  Coarse sounds noted throughout bilateral lung fields. No wheezing. CARDIOVASCULAR: regular rate, normal rhythm.  Clear S1, S2. ABDOMEN: soft, non-tender, non-distended.  Bowels sounds present in all quadrants and normoactive.  No palpable masses. EXT: warm and dry.  Peripheral pulses equal, intact, and +2 globally.  No clubbing or cyanosis.  Trace edema in bilateral lower extremities. NEURO: intubated and mildy sedation.  Responds to voice and tactile stimuli. Able to follow commands. Intake/Output Summary (Last 24 hours) at 09/14/11 0929 Last data filed at 09/14/11 0800  Gross per 24 hour  Intake 3522.13 ml  Output   3461 ml  Net  61.13 ml   Ventilator settings: Vent Mode:  [-] PRVC FiO2 (%):  [40 %-100 %] 50 % Set Rate:  [18 bmp-22 bmp] 18 bmp Vt Set:  [500 mL] 500 mL PEEP:  [5 cmH20-8 cmH20] 8 cmH20 Plateau Pressure:  [0 cmH20-23 cmH20] 23 cmH20  Lab Results: Basic Metabolic Panel:  Lab 09/14/11 4782 09/13/11 0400  NA 138 138  K 3.3* 3.3*  CL 99 103  CO2 26 26  GLUCOSE 117* 115*  BUN 21 15  CREATININE 1.59* 1.14*  CALCIUM 9.2 8.9  MG 2.0 2.2  PHOS 4.5 2.7   Liver Function Tests:  Lab 09/12/11 0400 09/09/11 1213  AST 38* 253*  ALT 53* 138*  ALKPHOS 110 119*  BILITOT 0.7 0.6  PROT  6.0 7.2  ALBUMIN 2.5* 3.7   CBC:  Lab 09/14/11 0330 09/13/11 0400 09/09/11 1213  WBC 7.5 10.3 --  NEUTROABS -- -- 3.0  HGB 10.9* 10.7* --  HCT 31.9* 31.8* --  MCV 99.1 99.7 --  PLT 232 216 --   Cardiac Enzymes:  Lab 09/10/11 0350 09/09/11 2130 09/09/11 1354  CKTOTAL 1676* 996* 167  CKMB 15.5* 11.7* 2.3  CKMBINDEX -- -- --  TROPONINI 0.58* 0.95* <0.30   CBG:  Lab 09/14/11 0725 09/14/11 0336 09/13/11 2340 09/13/11 1941 09/13/11 1635 09/13/11 1217  GLUCAP 127* 113* 123* 130* 111* 150*   Coagulation:  Lab 09/09/11 1940  09/09/11 1213  LABPROT 15.1 13.4  INR 1.17 1.00   Urine Drug Screen: Drugs of Abuse     Component Value Date/Time   LABOPIA NONE DETECTED 09/09/2011 1513   COCAINSCRNUR POSITIVE* 09/09/2011 1513   LABBENZ POSITIVE* 09/09/2011 1513   AMPHETMU NONE DETECTED 09/09/2011 1513   THCU NONE DETECTED 09/09/2011 1513   LABBARB NONE DETECTED 09/09/2011 1513    Urinalysis:  Lab 09/12/11 1335 09/09/11 1254  COLORURINE YELLOW YELLOW  LABSPEC 1.008 1.012  PHURINE 6.0 6.0  GLUCOSEU NEGATIVE 500*  HGBUR SMALL* SMALL*  BILIRUBINUR NEGATIVE NEGATIVE  KETONESUR NEGATIVE NEGATIVE  PROTEINUR NEGATIVE 100*  UROBILINOGEN 1.0 0.2  NITRITE NEGATIVE NEGATIVE  LEUKOCYTESUR NEGATIVE NEGATIVE   Labs and Imaging:   Dg Chest Port 1 View  09/14/2011  *RADIOLOGY REPORT*  Clinical Data: Endotracheal tube  PORTABLE CHEST - 1 VIEW  Comparison: Chest radiograph 04/25/ 2013  Findings: Endotracheal tube, NG tube, and central venous line are unchanged.  Stable cardiac silhouette.  There is  improved aeration the left and right lung base and the cardiac silhouette appears slightly reduced in size. Decreased pleural effusions.  There is persistent dense air space disease in the right upper lobe centrally.  Dense left lower lobe atelectasis.  No pneumothorax.  IMPRESSION:  1.  Improved aeration to the in the lung bases with decreased effusions. 2.  Decrease in size of cardiac silhouette. 3.  Persistent dense right upper lobe air space disease and left lower lobe atelectasis.  Original Report Authenticated By: Genevive Bi, M.D.   Dg Chest Port 1 View  09/13/2011  *RADIOLOGY REPORT*  Clinical Data: 61 year old female status post intubation.  Cardiac arrest, respiratory failure.  PORTABLE CHEST - 1 VIEW  Comparison: 0533 hours the same day and earlier.  Findings: AP portable semi upright view at 1234 hours. Endotracheal tube tip has been advanced and is about 18 mm from the carina.  The patient is now rotated to the right.   Enteric tube looped in the left upper quadrant.  Stable left IJ central line. Bilateral veiling pulmonary opacity.  Confluent retrocardiac opacity.  Continued right perihilar airspace disease.  No pneumothorax.  IMPRESSION: 1.  Endotracheal tube tip 18 mm from the carina. Otherwise, stable lines and tubes. 2.  Bilateral pleural effusions, lower lobe consolidation or collapse, and right perihilar airspace disease.  Original Report Authenticated By: Harley Hallmark, M.D.   Dg Chest Port 1 View  09/13/2011  *RADIOLOGY REPORT*  Clinical Data: 61 year old female.  Cardiac arrest, respiratory failure.  PORTABLE CHEST - 1 VIEW  Comparison: 09/12/2011 and earlier.  Findings: Stable endotracheal tube tip at the level of clavicles. Enteric tube terminates in the left upper quadrant.  Stable left IJ central line. The patient is more rotated to the left.  Retrocardiac consolidation has not significantly changed.  Mildly decreased patchy and  veiling opacity in the right lung.  No pneumothorax or edema.  Probable small right effusion.  IMPRESSION: 1. Stable lines and tubes. 2.  Left lower lobe consolidation is stable. 3.  Mildly improved ventilation in the right lung where there is patchy and confluent perihilar and basilar opacity.  Original Report Authenticated By: Harley Hallmark, M.D.   Assessment and Plan:  VDRF 2/2 VF arrest:  VF arrest likely the result of cocaine use.  2D echo reveals severely reduced LV function with EF 10-20%.  Extubated after a successful trial but developed stridor and flash pulmonary edema requiring reintubation.  Will not attempt extubation today given extreme agitation, stridor, and edematous vocal cords during attempt on 4/25. - PS trials today but no extubation until fully diuresed and no further evidence of pulmonary edema. - Will d/c heparin gtt today per cards recs - Continue ASA. - Lopressor d/c'd 2/2 cocaine use will continue carvedilol - Cardiology following, plan to proceed with  cath once mental status is accurately assessed.  RUL infiltrate, likely MSSA PNA: Resp cx from 4/23 with MSSA.  All blood cx and repeat sputum cx without growth. - D/C vanc and zosyn - Start Nafcillin 2gm IV q4 and plan for total of 8 days abx - Follow repeat cultures  Acute encephalopathy: likely the result of hypoxia/anoxic brain injury following VF arrest. - EEG pending, will f/u results  Hypokalemia: Mag wnl; K remains low. - Continue to replete with goal K of 4.0 and Mg with F/U lytes in AM.  Cocaine use: UDS positive for cocaine.  EtOH also probable given MCV > 100 and AST:ALT roughly 2:1. - Consider SW consult if patient has meaningful recovery - Low dose carvedilol today.  Elevated LFTs: AST:ALT elevated at 2:1 split suggesting EtOH use; hypotension and "shock liver" also likely contributing to abnormal liver enzymes likely due to ETOH.  LFTs improved.  - Start statin today.  ARF: Cr slightly elevated this am reflecting diuresis. - Continue to monitor Cr, electrolytes, and UOP  Best Practice: DVT: heparin  SUP: protonix Nutrition: TF Glycemic control: SSI Sedation/analgesia: Versed/Fentanyl  MILLS,KRISTIN, M.D. 09/14/2011 9:55 AM  Will give additional diureses today and attempt to maintain calm overnight with PS trials today and SBT in AM if able to diurese well.  Would not extubate without a cuff leak.  Patient had significant edema in her vocal cords upon re-intubation.  Continue carvedilol and antiHTN, replace electrolytes.  Mental status is the next big hurdle.  CC time 35 minutes.  Patient seen and examined, agree with above note.  I dictated the care and orders written for this patient under my direction.  Koren Bound, M.D. [phone]

## 2011-09-14 NOTE — Progress Notes (Signed)
ANTICOAGULATION CONSULT NOTE - Follow Up Consult  Pharmacy Consult for heparin Indication: s/p hypothermia  Labs:  Basename 09/14/11 0330 09/13/11 1544 09/13/11 0400 09/12/11 0400  HGB 10.9* -- 10.7* --  HCT 31.9* -- 31.8* 32.3*  PLT 232 -- 216 204  APTT -- -- -- --  LABPROT -- -- -- --  INR -- -- -- --  HEPARINUNFRC 0.49 0.22* 0.29* --  CREATININE 1.59* -- 1.14* 1.06  CKTOTAL -- -- -- --  CKMB -- -- -- --  TROPONINI -- -- -- --    Assessment/Plan: 60yo female now therapeutic on heparin s/p Vfib arrest/hypothermia.  Will continue heparin at current rate and confirm stable with additional level.   Colleen Can PharmD BCPS 09/14/2011,4:52 AM

## 2011-09-14 NOTE — Progress Notes (Signed)
Subjective:  Awake , responds to questions.  Objective:  Vital Signs in the last 24 hours: Temp:  [98.6 F (37 C)-101 F (38.3 C)] 98.6 F (37 C) (04/26 0720) Pulse Rate:  [70-107] 89  (04/26 0720) Resp:  [18-26] 18  (04/26 0600) BP: (63-170)/(40-94) 83/62 mmHg (04/26 0700) SpO2:  [92 %-99 %] 99 % (04/26 0720) Arterial Line BP: (73-172)/(42-89) 98/42 mmHg (04/26 0600) FiO2 (%):  [40 %-100 %] 50 % (04/26 0600) Weight:  [79.4 kg (175 lb 0.7 oz)] 79.4 kg (175 lb 0.7 oz) (04/26 0446)  Intake/Output from previous day:  Intake/Output Summary (Last 24 hours) at 09/14/11 0801 Last data filed at 09/14/11 0600  Gross per 24 hour  Intake 3347.63 ml  Output   3311 ml  Net  36.63 ml    Physical Exam: General appearance: intubated Lungs: decreased breath sounds Lt > Rt Heart: regular rate and rhythm   Rate: 86  Rhythm: normal sinus rhythm and PACs  Lab Results:  Basename 09/14/11 0330 09/13/11 0400  WBC 7.5 10.3  HGB 10.9* 10.7*  PLT 232 216    Basename 09/14/11 0330 09/13/11 0400  NA 138 138  K 3.3* 3.3*  CL 99 103  CO2 26 26  GLUCOSE 117* 115*  BUN 21 15  CREATININE 1.59* 1.14*   No results found for this basename: TROPONINI:2,CK,MB:2 in the last 72 hours Hepatic Function Panel  Basename 09/12/11 0400  PROT 6.0  ALBUMIN 2.5*  AST 38*  ALT 53*  ALKPHOS 110  BILITOT 0.7  BILIDIR 0.2  IBILI 0.5   No results found for this basename: CHOL in the last 72 hours No results found for this basename: INR in the last 72 hours  Imaging: Imaging results have been reviewed  Assessment/Plan:   Principal Problem:  *Cardiac arrest - ventricular fibrillation, with [place] Protocol  Active Problems:  Acute respiratory failure with hypoxia, failed extubation yesterday  HIE (hypoxic-ischemic encephalopathy)  Dependence on respirator, status  Cardiomyopathy, 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 burned out HCM from untreated HTN +/- EtOH & Cocaine) LVH  MR (mitral  regurgitation), moderate  Hypokalemia  Plan-Will order K+. SCr bumped today to 1.59. I/O neg 2L last 24hrs, diuretics per CCM. Consider adding Milrinone? (EF 10-20%). B/P has been labile, not sure Clonidine is the best agent for this, will discuss with MD- ?change to Norvasc.  Corine Shelter PA-C 09/14/2011, 8:01 AM  Did not fare well with extubation trials yesterday -- still planning to try again today. Bp remains labile --  For short term control clonidine may be helpful, but I agree that Iwould hope to get her on a more cardiomyopathy friendly antihypertensive -- would not use ACE-I/ARB as her Cr is increasing which may be related to diuresis as we are beyond the stage of shock ARF.  For afterload reduction, would use Nitrates & hydralazine - isordil 20 bid & hydralazine 25 tid to start.  She has tolerated carvedilol, so we could push that to 12.5 mg bid which would be a reasonable target dose prior to advancing another agent.  She does not appear to be showing significant signs of CHF.  & no signs of unstable coronary syndrome / or Atrial fibrillation -- can likley d/c IV Heparin gtt & change to prophylactic doses.  Would still consider LHC +/- RHC for diagnostic purposes if stable with evidence of reasonable neurologic recovery & no active signs of infection. With primary cardiac arrest event & extremely low EF -  AICD would be ideal, but would need to remain abstinent from narcotics & EtoH.   We will follow along.  Marykay Lex, M.D., M.S. [facility] [address] [place], [place]  [address]  [phone] Pager # [phone]  09/14/2011 8:46 AM

## 2011-09-14 NOTE — Progress Notes (Signed)
UR Completed.  Dawn Cruz Jane [phone] 09/14/2011  

## 2011-09-14 NOTE — Progress Notes (Signed)
Clinical Child psychotherapist received referral for [employer], CSW reviewed chart and spoke with RN.  At this time, no issues are identified; family is agreeable with care, pt intubated and sedated.  CSW to sign off at this time and encouraged RN to notify CSW once pt is medically able to participate in substance abuse assessment or if any additional needs arise.   Angelia Mould, MSW, [place] [phone]

## 2011-09-15 ENCOUNTER — Inpatient Hospital Stay (HOSPITAL_COMMUNITY): Payer: BC Managed Care – PPO

## 2011-09-15 LAB — POCT I-STAT 3, ART BLOOD GAS (G3+)
Acid-Base Excess: 7 mmol/L — ABNORMAL HIGH (ref 0.0–2.0)
Acid-Base Excess: 9 mmol/L — ABNORMAL HIGH (ref 0.0–2.0)
Bicarbonate: 30 mEq/L — ABNORMAL HIGH (ref 20.0–24.0)
O2 Saturation: 95 %
Patient temperature: 99.5
Patient temperature: 98.7
pH, Arterial: 7.526 — ABNORMAL HIGH (ref 7.350–7.400)

## 2011-09-15 LAB — CULTURE, BLOOD (ROUTINE X 2)
Culture: NO GROWTH
Culture: NO GROWTH

## 2011-09-15 LAB — CBC
Hemoglobin: 10.9 g/dL — ABNORMAL LOW (ref 12.0–15.0)
MCH: 33.5 pg (ref 26.0–34.0)
MCHC: 34.3 g/dL (ref 30.0–36.0)
MCV: 97.8 fL (ref 78.0–100.0)
RBC: 3.25 MIL/uL — ABNORMAL LOW (ref 3.87–5.11)

## 2011-09-15 LAB — BASIC METABOLIC PANEL
BUN: 25 mg/dL — ABNORMAL HIGH (ref 6–23)
CO2: 31 mEq/L (ref 19–32)
Calcium: 9.5 mg/dL (ref 8.4–10.5)
GFR calc non Af Amer: 31 mL/min — ABNORMAL LOW (ref >90)
Glucose, Bld: 125 mg/dL — ABNORMAL HIGH (ref 70–99)
Potassium: 3.2 mEq/L — ABNORMAL LOW (ref 3.5–5.1)

## 2011-09-15 LAB — CULTURE, RESPIRATORY W GRAM STAIN

## 2011-09-15 LAB — MAGNESIUM: Magnesium: 2.3 mg/dL (ref 1.5–2.5)

## 2011-09-15 LAB — GLUCOSE, CAPILLARY
Glucose-Capillary: 123 mg/dL — ABNORMAL HIGH (ref 70–99)
Glucose-Capillary: 115 mg/dL — ABNORMAL HIGH (ref 70–99)

## 2011-09-15 MED ORDER — ONDANSETRON HCL 4 MG/2ML IJ SOLN
INTRAMUSCULAR | Status: AC
Start: 2011-09-15 — End: 2011-09-15
  Administered 2011-09-15: 4 mg
  Filled 2011-09-15: qty 2

## 2011-09-15 MED ORDER — FUROSEMIDE 10 MG/ML IJ SOLN
20.0000 mg | Freq: Every day | INTRAMUSCULAR | Status: DC
  Administered 2011-09-15: 20 mg via INTRAVENOUS
  Filled 2011-09-15 (×2): qty 2

## 2011-09-15 MED ORDER — POTASSIUM CHLORIDE 20 MEQ/15ML (10%) PO LIQD
40.0000 meq | Freq: Once | ORAL | Status: DC
  Filled 2011-09-15: qty 30

## 2011-09-15 MED ORDER — HYDRALAZINE HCL 25 MG PO TABS
25.0000 mg | ORAL_TABLET | Freq: Three times a day (TID) | ORAL | Status: DC
  Administered 2011-09-15 – 2011-09-20 (×16): 25 mg via ORAL
  Filled 2011-09-15 (×20): qty 1

## 2011-09-15 MED ORDER — POTASSIUM CHLORIDE 20 MEQ/15ML (10%) PO LIQD
40.0000 meq | Freq: Once | ORAL | Status: AC
  Administered 2011-09-15: 40 meq
  Filled 2011-09-15: qty 30

## 2011-09-15 MED ORDER — ONDANSETRON HCL 4 MG/2ML IJ SOLN
4.0000 mg | Freq: Four times a day (QID) | INTRAMUSCULAR | Status: DC | PRN
  Administered 2011-09-18: 4 mg via INTRAVENOUS
  Filled 2011-09-15: qty 2

## 2011-09-15 MED ORDER — FUROSEMIDE 10 MG/ML IJ SOLN
40.0000 mg | Freq: Every day | INTRAMUSCULAR | Status: DC
  Filled 2011-09-15: qty 4

## 2011-09-15 MED ORDER — ISOSORBIDE DINITRATE 10 MG PO TABS
10.0000 mg | ORAL_TABLET | Freq: Three times a day (TID) | ORAL | Status: DC
  Administered 2011-09-15 – 2011-09-19 (×13): 10 mg via ORAL
  Filled 2011-09-15 (×17): qty 1

## 2011-09-15 NOTE — Progress Notes (Signed)
Subjective:  Intubated, awake and alert, but easily aggitated. Does not appear to be in any distress  Objective:  Vital Signs in the last 24 hours: Temp:  [97.8 F (36.6 C)-99.9 F (37.7 C)] 99.5 F (37.5 C) (04/27 0000) Pulse Rate:  [74-104] 96  (04/27 0802) Resp:  [15-24] 16  (04/27 0600) BP: (94-155)/(49-92) 154/73 mmHg (04/27 0802) SpO2:  [93 %-100 %] 98 % (04/27 0802) Arterial Line BP: (96-168)/(51-84) 117/60 mmHg (04/27 0600) FiO2 (%):  [40 %-50 %] 40 % (04/27 0802) Weight:  [77.2 kg (170 lb 3.1 oz)] 77.2 kg (170 lb 3.1 oz) (04/27 0500)  Intake/Output from previous day:  Intake/Output Summary (Last 24 hours) at 09/15/11 0811 Last data filed at 09/15/11 0600  Gross per 24 hour  Intake   2237 ml  Output   3650 ml  Net  -1413 ml    Physical Exam: General appearance: alert and intubated Lungs: decreased breath sounds Heart: regular rate and rhythm Abdomen: soft, not distended, obese Ext:  No c/c/e   Rate: 94  Rhythm: normal sinus rhythm  Lab Results:  Basename 09/15/11 0415 09/14/11 0330  WBC 5.7 7.5  HGB 10.9* 10.9*  PLT 241 232    Basename 09/15/11 0415 09/14/11 0330  NA 144 138  K 3.2* 3.3*  CL 100 99  CO2 31 26  GLUCOSE 125* 117*  BUN 25* 21  CREATININE 1.73* 1.59*    Imaging: Imaging results have been reviewed CXR done this am, reading pending  Cardiac Studies:  Assessment/Plan:   Principal Problem:  *Cardiac arrest - ventricular fibrillation, with [place] Protocol Active Problems:  Acute respiratory failure with hypoxia, failed extubation yesterday  HIE (hypoxic-ischemic encephalopathy)  Dependence on respirator, status  Cardiomyopathy, severe by echo 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 Extubate per CCM.  B/P drifting up, Will start Nitrates and Hydralazine or increase Coreg  to 12.5mg  BID- will discuss with Dr Herbie Baltimore. Continue gentle diuresis.  Corine Shelter PA-C 09/15/2011, 8:11 AM  I have seen & examined the patient this AM & reviewed the chart.  I agree with Susette Racer findings, assessment & recommendations. Diuresed last night with minimal resolution of CXR findings - ? If this is pulmonary edema vs. ARDS/PNA.  Her renal function continues to increase, would back off slightly on diuresis to once daily IV lasix. Increase afterload reduction as noted above as opposed to increasing BB dose (to avoid masking tachycardia as a sign of infection, etc) I have a hard time recommending Milrinone at this time as she is able to mount BPs in the 150s on Coreg.    She continues on weaning trials, but becomes quite agitated when sedation withheld.  She will need to be significantly more stable before considering safely performing cardiac catheterization.     Marykay Lex, M.D., M.S. [facility] [address] [place], [place]  [address]  [phone] Pager # [phone]  09/15/2011 8:34 AM

## 2011-09-15 NOTE — Progress Notes (Signed)
Name: Dawn Cruz MRN: [id number] DOB: 26-Jan-1951    LOS: 6  PCCM Progress NOTE  History of Present Illness: 61 yo aaf who complained of chest pain and was witnessed to pass out. EMS response time 10 minutes. Found to be in Vfib, shocked x 2, epi x 2 and brief chest compression. Transported to ED, intubated, coughing on vent prior to sedation and NMB for hypothermia protocol.  PCCM to admit.    Vital Signs: Temp:  [97.8 F (36.6 C)-99.9 F (37.7 C)] 99.5 F (37.5 C) (04/27 0000) Pulse Rate:  [74-104] 96  (04/27 0802) Resp:  [15-24] 16  (04/27 0600) BP: (94-155)/(49-92) 154/73 mmHg (04/27 0802) SpO2:  [93 %-100 %] 98 % (04/27 0802) Arterial Line BP: (96-168)/(51-84) 117/60 mmHg (04/27 0600) FiO2 (%):  [40 %-50 %] 40 % (04/27 0802) Weight:  [77.2 kg (170 lb 3.1 oz)] 77.2 kg (170 lb 3.1 oz) (04/27 0500) I/O last 3 completed shifts: In: 3850 [I.V.:1675; UV/OZ:3664; IV Piggyback:250] Out: 6551 [Urine:6550; Stool:1]  Lines, Tubes, etc: 4/21 ETT>> 4/25 ET tube 4/25>>> 4/21 LT IJ CVL>>  4/21 A Line>>  Microbiology: 4/21 BCx x 2>> NGTD 4/21 Urine cx>> NG 4/21 MRSA screen>>neg 4/23 Resp cx >> MSSA (resistant to PCN, intermediate resistance to erythro) 4/24 Blood x 2 >> NGTD 4/24 Sputum cx >> reincubated 4/24 Urine cx>> NG  Antibiotics:  Vancomycin 4/24>>> 4/26 Zosyn 4/24>>> 4/26 Nafcillin 4/26 >>> (anticipated stop date 4/31)  Studies/Events: 4/21 VF arrest  4/21 CT head>>> NAP 4/21 2D echo>>> Systolic function was severely reduced. The estimated ejection fraction was in the range of 10% to 20%. Severe diffuse hypokinesis.  Moderate MV regurg.  4/23: EEG >> 4/25: severe agitation following extubation; re-intubated for AMS/airwar protection  Consults:  4/22 Cardiology  Subjective:  On heavy sedation dose: RASS 0 to +2. RN says easily agitated on WUA. Creatinine worsening    Physical Examination: VItal signs reviewed . GEN: Pt appears critically ill.  Intubated  and sedated. HEENT: head is autraumatic and normocephalic.   PERRL.  Sclerae anicteric.  Conjunctivae without pallor or injection. ETT in place. RESP:  Coarse sounds noted throughout bilateral lung fields. No wheezing. CARDIOVASCULAR: regular rate, normal rhythm.  Clear S1, S2. ABDOMEN: soft, non-tender, non-distended.  Bowels sounds present in all quadrants and normoactive.  No palpable masses. EXT: warm and dry.  Peripheral pulses equal, intact, and +2 globally.  No clubbing or cyanosis.  Trace edema in bilateral lower extremities. NEURO responds to voice and tactile stimuli. Able to follow commands. But, agitated on WUA  Intake/Output Summary (Last 24 hours) at 09/15/11 0833 Last data filed at 09/15/11 0600  Gross per 24 hour  Intake   2237 ml  Output   3650 ml  Net  -1413 ml   Ventilator settings: Vent Mode:  [-] CPAP;PSV FiO2 (%):  [40 %-50 %] 40 % Set Rate:  [14 bmp-18 bmp] 14 bmp Vt Set:  [500 mL] 500 mL PEEP:  [5 cmH20] 5 cmH20 Pressure Support:  [5 cmH20-10 cmH20] 10 cmH20 Plateau Pressure:  [14 cmH20-19 cmH20] 14 cmH20  Lab Results: Basic Metabolic Panel:  Lab 09/15/11 4034 09/14/11 0330  NA 144 138  K 3.2* 3.3*  CL 100 99  CO2 31 26  GLUCOSE 125* 117*  BUN 25* 21  CREATININE 1.73* 1.59*  CALCIUM 9.5 9.2  MG 2.3 2.0  PHOS 5.8* 4.5   Liver Function Tests:  Lab 09/12/11 0400 09/09/11 1213  AST 38* 253*  ALT 53*  138*  ALKPHOS 110 119*  BILITOT 0.7 0.6  PROT 6.0 7.2  ALBUMIN 2.5* 3.7   CBC:  Lab 09/15/11 0415 09/14/11 0330 09/09/11 1213  WBC 5.7 7.5 --  NEUTROABS -- -- 3.0  HGB 10.9* 10.9* --  HCT 31.8* 31.9* --  MCV 97.8 99.1 --  PLT 241 232 --   Cardiac Enzymes:  Lab 09/10/11 0350 09/09/11 2130 09/09/11 1354  CKTOTAL 1676* 996* 167  CKMB 15.5* 11.7* 2.3  CKMBINDEX -- -- --  TROPONINI 0.58* 0.95* <0.30   CBG:  Lab 09/15/11 0321 09/14/11 2326 09/14/11 1950 09/14/11 1838 09/14/11 1234 09/14/11 0725  GLUCAP 123* 133* 139* 121* 109* 127*    Coagulation:  Lab 09/09/11 1940 09/09/11 1213  LABPROT 15.1 13.4  INR 1.17 1.00   Urine Drug Screen: Drugs of Abuse     Component Value Date/Time   LABOPIA NONE DETECTED 09/09/2011 1513   COCAINSCRNUR POSITIVE* 09/09/2011 1513   LABBENZ POSITIVE* 09/09/2011 1513   AMPHETMU NONE DETECTED 09/09/2011 1513   THCU NONE DETECTED 09/09/2011 1513   LABBARB NONE DETECTED 09/09/2011 1513    Urinalysis:  Lab 09/12/11 1335 09/09/11 1254  COLORURINE YELLOW YELLOW  LABSPEC 1.008 1.012  PHURINE 6.0 6.0  GLUCOSEU NEGATIVE 500*  HGBUR SMALL* SMALL*  BILIRUBINUR NEGATIVE NEGATIVE  KETONESUR NEGATIVE NEGATIVE  PROTEINUR NEGATIVE 100*  UROBILINOGEN 1.0 0.2  NITRITE NEGATIVE NEGATIVE  LEUKOCYTESUR NEGATIVE NEGATIVE   Labs and Imaging:   Dg Chest Port 1 View  09/15/2011  *RADIOLOGY REPORT*  Clinical Data: Endotracheal tube placement  PORTABLE CHEST - 1 VIEW  Comparison: Chest radiograph 09/14/2011  Findings: Endotracheal tube, NG tube, and central venous line are unchanged.  Stable cardiac silhouette.  The perihilar air space disease more dense on the right which is slightly improved.  Left lower lobe atelectasis similar.  No pneumothorax.  IMPRESSION:  1.  Stable support apparatus. 2.  Some mild improvement in dense perihilar air space disease on the right.  Original Report Authenticated By: Genevive Bi, M.D.   Dg Chest Port 1 View  09/14/2011  *RADIOLOGY REPORT*  Clinical Data: Endotracheal tube  PORTABLE CHEST - 1 VIEW  Comparison: Chest radiograph 04/25/ 2013  Findings: Endotracheal tube, NG tube, and central venous line are unchanged.  Stable cardiac silhouette.  There is  improved aeration the left and right lung base and the cardiac silhouette appears slightly reduced in size. Decreased pleural effusions.  There is persistent dense air space disease in the right upper lobe centrally.  Dense left lower lobe atelectasis.  No pneumothorax.  IMPRESSION:  1.  Improved aeration to the in the lung  bases with decreased effusions. 2.  Decrease in size of cardiac silhouette. 3.  Persistent dense right upper lobe air space disease and left lower lobe atelectasis.  Original Report Authenticated By: Genevive Bi, M.D.   Dg Chest Port 1 View  09/13/2011  *RADIOLOGY REPORT*  Clinical Data: 61 year old female status post intubation.  Cardiac arrest, respiratory failure.  PORTABLE CHEST - 1 VIEW  Comparison: 0533 hours the same day and earlier.  Findings: AP portable semi upright view at 1234 hours. Endotracheal tube tip has been advanced and is about 18 mm from the carina.  The patient is now rotated to the right.  Enteric tube looped in the left upper quadrant.  Stable left IJ central line. Bilateral veiling pulmonary opacity.  Confluent retrocardiac opacity.  Continued right perihilar airspace disease.  No pneumothorax.  IMPRESSION: 1.  Endotracheal tube tip 18 mm  from the carina. Otherwise, stable lines and tubes. 2.  Bilateral pleural effusions, lower lobe consolidation or collapse, and right perihilar airspace disease.  Original Report Authenticated By: Harley Hallmark, M.D.   Assessment and Plan:  VDRF 2/2 VF arrest:  VF arrest likely the result of cocaine use.  2D echo reveals severely reduced LV function with EF 10-20%.  Extubated after a successful trial but developed stridor and flash pulmonary edema requiring reintubation.  Will not attempt extubation today given extreme agitation, stridor, and edematous vocal cords during attempt on 4/25.    On 08/26/11: Agitated on WUA. Therefore, does not meet extubation  criteria though on  sbt PLAN - PSV as tolerated   - No extubation until fully diuresed and no further evidence of pulmonary edema  And/or delirium - Continue ASA. - Lopressor d/c'd 2/2 cocaine use will continue carvedilol - Cardiology following, plan to proceed with cath once mental status is accurately assessed.  RUL infiltrate, likely MSSA PNA: Resp cx from 4/23 with MSSA.  All blood  cx and repeat sputum cx without growth. -  Nafcillin 2gm IV q4 and plan for total of 8 days abx - Follow repeat cultures  Acute encephalopathy: likely the result of hypoxia/anoxic brain injury following VF arrest.  - on 4/27: Appears to follow commands or track  PLAN - EEG pending, will f/u results - monitor  ELECTROLYETS    Lab 09/15/11 0415 09/14/11 0330 09/13/11 0400 09/12/11 0400 09/11/11 1610 09/11/11 0400  NA 144 138 138 141 141 --  K 3.2* 3.3* -- -- -- --  CL 100 99 103 110 111 --  CO2 31 26 26 22 21  --  GLUCOSE 125* 117* 115* 102* 103* --  BUN 25* 21 15 11 6  --  CREATININE 1.73* 1.59* 1.14* 1.06 1.03 --  CALCIUM 9.5 9.2 8.9 8.6 8.3* --  MG 2.3 2.0 2.2 1.8 -- 2.0  PHOS 5.8* 4.5 2.7 2.2* -- 3.1   PLAN - Continue to replete with goal K of 4.0 and Mg with F/U lytes in AM.  Cocaine use: UDS positive for cocaine.  EtOH also probable given MCV > 100 and AST:ALT roughly 2:1. - Consider SW consult if patient has meaningful recovery - Low dose carvedilol since  4./26/13  Elevated LFTs:   Lab 09/12/11 0400 09/09/11 1940 09/09/11 1213  AST 38* -- 253*  ALT 53* -- 138*  ALKPHOS 110 -- 119*  BILITOT 0.7 -- 0.6  PROT 6.0 -- 7.2  ALBUMIN 2.5* -- 3.7  INR -- 1.17 1.00   AST:ALT elevated at 2:1 split suggesting EtOH use; hypotension and "shock liver" also likely contributing to abnormal liver enzymes likely due to ETOH.  LFTs improved.   pLAN.  - monitor with  statin use since 09/14/11.  ARF:  Lab 09/15/11 0415 09/14/11 0330 09/13/11 0400 09/12/11 0400 09/11/11 1610  CREATININE 1.73* 1.59* 1.14* 1.06 1.03   Intake/Output      04/26 0701 - 04/27 0700 04/27 0701 - 04/28 0700   I.V. (mL/kg) 1007 (13)    NG/GT 1265    IV Piggyback 50    Total Intake(mL/kg) 2322 (30.1)    Urine (mL/kg/hr) 3800 (2.1)    Stool     Total Output 3800    Net -1478          PLAN  - Bacck off on aggressive diuresis (per cards) but maintain 20mg  lasix IV daily - Continue to monitor Cr,  electrolytes, and UOP   HEME  Lab 09/15/11 0415 09/14/11 0330 09/13/11 0400 09/12/11 0400 09/11/11 0400  HGB 10.9* 10.9* 10.7* 10.7* 12.5   PLAN  - prbc for hgb < 8gm% only due to MI   Best Practice: DVT: heparin  SUP: protonix Nutrition: TF Glycemic control: SSI Sedation/analgesia: Versed/Fentanyl  The patient is critically ill with multiple organ systems failure and requires high complexity decision making for assessment and support, frequent evaluation and titration of therapies, application of advanced monitoring technologies and extensive interpretation of multiple databases.   Critical Care Time devoted to patient care services described in this note is  35  Minutes.  Dr. Kalman Shan, M.D., [facility].C.P Pulmonary and Critical Care Medicine Staff Physician [facility] Pager: [phone], If no answer or between  15:00h - 7:00h: call [phone]  09/15/2011 8:40 AM

## 2011-09-15 NOTE — Progress Notes (Signed)
Pt sitting straight up in bed, reaching for et tube, orogastric tube lying on chest - Pt vomited approx 75cc undigested tube feeding.  X 3 .  Zofran given for nausea and n-g inserted after unable to place orogastric tube due to retching despite mod sedation with Fentanyl/versed boluses.

## 2011-09-15 NOTE — Progress Notes (Signed)
WUA performed with sedation cut back 50% - Pt responsive to commands, moves all extremities with purpose, nods and shakes head in response to questions.  Becomes agitated if drip off for short time so replaced on gtts as before.

## 2011-09-16 LAB — GLUCOSE, CAPILLARY
Glucose-Capillary: 116 mg/dL — ABNORMAL HIGH (ref 70–99)
Glucose-Capillary: 127 mg/dL — ABNORMAL HIGH (ref 70–99)

## 2011-09-16 LAB — BASIC METABOLIC PANEL
Calcium: 9.1 mg/dL (ref 8.4–10.5)
GFR calc non Af Amer: 23 mL/min — ABNORMAL LOW (ref >90)
Glucose, Bld: 129 mg/dL — ABNORMAL HIGH (ref 70–99)
Sodium: 145 mEq/L (ref 135–145)

## 2011-09-16 MED ORDER — CEFAZOLIN SODIUM 1-5 GM-% IV SOLN
1.0000 g | Freq: Two times a day (BID) | INTRAVENOUS | Status: DC
  Administered 2011-09-16 – 2011-09-18 (×4): 1 g via INTRAVENOUS
  Filled 2011-09-16 (×6): qty 50

## 2011-09-16 NOTE — Progress Notes (Signed)
eLink Physician-Brief Progress Note Patient Name: Dawn Cruz DOB: [birth year] MRN: [id number]  Date of Service  09/16/2011   HPI/Events of Note   Rising creat  eICU Interventions  Change nafcillin to ancef   Intervention Category Intermediate Interventions: Diagnostic test evaluation  [name] 09/16/2011, 5:08 PM

## 2011-09-16 NOTE — Progress Notes (Signed)
Pt found with et tube and oro-gastric tube lying on her chest.  Copious secretions suctioned - then placed on n/c.  Dr. Bonney Aid in unit and aware.  Pt moderately hoarse and has copious secretions - pt instructed in use of [place] and assists with suctioning frequently. Appears to have tolerated extubation well.

## 2011-09-16 NOTE — Progress Notes (Signed)
ANTIBIOTIC CONSULT NOTE - INITIAL  Pharmacy Consult for Cefazolin Indication: MSSA PNA  No Known Allergies  Patient Measurements: Height: 5\' 8"  (172.7 cm) Weight: 173 lb 8 oz (78.7 kg) IBW/kg (Calculated) : 63.9    Vital Signs: Temp: 99.2 F (37.3 C) (04/28 1451) Temp src: Oral (04/28 1451) BP: 162/66 mmHg (04/28 1718) Pulse Rate: 92  (04/28 1718) Intake/Output from previous day: 04/27 0701 - 04/28 0700 In: 1591 [I.V.:656; NG/GT:785; IV Piggyback:150] Out: 1950 [Urine:1950] Intake/Output from this shift:    Labs:  Basename 09/16/11 0500 09/15/11 0415 09/14/11 0330  WBC -- 5.7 7.5  HGB -- 10.9* 10.9*  PLT -- 241 232  LABCREA -- -- --  CREATININE 2.19* 1.73* 1.59*   Estimated Creatinine Clearance: 30.5 ml/min (by C-G formula based on Cr of 2.19). No results found for this basename: VANCOTROUGH:2,VANCOPEAK:2,VANCORANDOM:2,GENTTROUGH:2,GENTPEAK:2,GENTRANDOM:2,TOBRATROUGH:2,TOBRAPEAK:2,TOBRARND:2,AMIKACINPEAK:2,AMIKACINTROU:2,AMIKACIN:2, in the last 72 hours   Microbiology: Recent Results (from the past 720 hour(s))  CULTURE, BLOOD (ROUTINE X 2)     Status: Normal   Collection Time   09/09/11 12:12 PM      Component Value Range Status Comment   Specimen Description BLOOD LEFT ARM   Final    Special Requests BOTTLES DRAWN AEROBIC ONLY [facility]   Final    Culture  Setup Time [id number]   Final    Culture NO GROWTH 5 DAYS   Final    Report Status 09/15/2011 FINAL   Final   URINE CULTURE     Status: Normal   Collection Time   09/09/11 12:54 PM      Component Value Range Status Comment   Specimen Description URINE, CLEAN CATCH   Final    Special Requests NONE   Final    Culture  Setup Time [id number]   Final    Colony Count NO GROWTH   Final    Culture NO GROWTH   Final    Report Status 09/10/2011 FINAL   Final   CULTURE, BLOOD (ROUTINE X 2)     Status: Normal   Collection Time   09/09/11  2:10 PM      Component Value Range Status Comment   Specimen Description BLOOD  LEFT ARTERY   Final    Special Requests BOTTLES DRAWN AEROBIC AND ANAEROBIC 10CC   Final    Culture  Setup Time [id number]   Final    Culture NO GROWTH 5 DAYS   Final    Report Status 09/15/2011 FINAL   Final   MRSA PCR SCREENING     Status: Normal   Collection Time   09/09/11  3:15 PM      Component Value Range Status Comment   MRSA by PCR NEGATIVE  NEGATIVE  Final   CULTURE, RESPIRATORY     Status: Normal   Collection Time   09/11/11 11:27 PM      Component Value Range Status Comment   Specimen Description TRACHEAL ASPIRATE   Final    Special Requests Normal   Final    Gram Stain     Final    Value: MODERATE WBC PRESENT,BOTH PMN AND MONONUCLEAR     NO SQUAMOUS EPITHELIAL CELLS SEEN     FEW GRAM POSITIVE COCCI IN PAIRS     IN CLUSTERS   Culture     Final    Value: ABUNDANT STAPHYLOCOCCUS AUREUS     Note: RIFAMPIN AND GENTAMICIN SHOULD NOT BE USED AS SINGLE DRUGS FOR TREATMENT OF STAPH INFECTIONS.   Report Status 09/14/2011 FINAL  Final    Organism ID, Bacteria STAPHYLOCOCCUS AUREUS   Final   CULTURE, BLOOD (ROUTINE X 2)     Status: Normal (Preliminary result)   Collection Time   09/12/11 11:26 AM      Component Value Range Status Comment   Specimen Description BLOOD LEFT FOREARM   Final    Special Requests BOTTLES DRAWN AEROBIC ONLY 10CC   Final    Culture  Setup Time [id number]   Final    Culture     Final    Value:        BLOOD CULTURE RECEIVED NO GROWTH TO DATE CULTURE WILL BE HELD FOR 5 DAYS BEFORE ISSUING A FINAL NEGATIVE REPORT   Report Status PENDING   Incomplete   CULTURE, BLOOD (ROUTINE X 2)     Status: Normal (Preliminary result)   Collection Time   09/12/11 11:36 AM      Component Value Range Status Comment   Specimen Description BLOOD LEFT HAND   Final    Special Requests BOTTLES DRAWN AEROBIC ONLY [facility]   Final    Culture  Setup Time [id number]   Final    Culture     Final    Value:        BLOOD CULTURE RECEIVED NO GROWTH TO DATE CULTURE WILL BE HELD FOR 5  DAYS BEFORE ISSUING A FINAL NEGATIVE REPORT   Report Status PENDING   Incomplete   CULTURE, RESPIRATORY     Status: Normal   Collection Time   09/12/11 11:42 AM      Component Value Range Status Comment   Specimen Description ENDOTRACHEAL ASPIRATE   Final    Special Requests ADDED AT 1144   Final    Gram Stain     Final    Value: MODERATE WBC PRESENT,BOTH PMN AND MONONUCLEAR     RARE SQUAMOUS EPITHELIAL CELLS PRESENT     FEW GRAM POSITIVE COCCI IN PAIRS   Culture     Final    Value: ABUNDANT STAPHYLOCOCCUS AUREUS     Note: RIFAMPIN AND GENTAMICIN SHOULD NOT BE USED AS SINGLE DRUGS FOR TREATMENT OF STAPH INFECTIONS.   Report Status 09/15/2011 FINAL   Final    Organism ID, Bacteria STAPHYLOCOCCUS AUREUS   Final   URINE CULTURE     Status: Normal   Collection Time   09/12/11  1:35 PM      Component Value Range Status Comment   Specimen Description URINE, CATHETERIZED   Final    Special Requests NONE   Final    Culture  Setup Time [id number]   Final    Colony Count NO GROWTH   Final    Culture NO GROWTH   Final    Report Status 09/13/2011 FINAL   Final     Medical History: Past Medical History  Diagnosis Date  . Cardiomyopathy, severe by echo with EF 10-20% 09/11/2011  . MR (mitral regurgitation), moderate 09/11/2011   Assessment: 60 YOF admitted on 4/21 with VDRF 2/2 VF arrest, with RUL infiltrate, resp. cx + for MSSA, was on nafcillin since 4/26, now with ARF, scr 2.19 <--1.73<--1.59 , switching to ancef  Plan:  - Ancef 1g IV Q 12hrs - monitor renal function and adjust dosage if needed  Bayard Hugger, PharmD, BCPS  Clinical Pharmacist  Pager: [phone]

## 2011-09-16 NOTE — Progress Notes (Signed)
Name: Dawn Cruz MRN: [id number] DOB: 1951-04-13    LOS: 7  PCCM Progress NOTE  History of Present Illness: 61 yo aaf who complained of chest pain and was witnessed to pass out. EMS response time 10 minutes. Found to be in Vfib, shocked x 2, epi x 2 and brief chest compression. Transported to ED, intubated, coughing on vent prior to sedation and NMB for hypothermia protocol.  PCCM to admit.    Vital Signs: Temp:  [98.6 F (37 C)-99.2 F (37.3 C)] 98.6 F (37 C) (04/28 0736) Pulse Rate:  [84-107] 92  (04/28 0812) Resp:  [14-23] 14  (04/28 0600) BP: (86-159)/(48-85) 155/70 mmHg (04/28 0812) SpO2:  [92 %-99 %] 98 % (04/28 0812) Arterial Line BP: (82-231)/(46-104) 104/53 mmHg (04/28 0600) FiO2 (%):  [40 %] 40 % (04/28 0812) Weight:  [173 lb 8 oz (78.7 kg)] 173 lb 8 oz (78.7 kg) (04/28 0500) I/O last 3 completed shifts: In: 2755 [I.V.:1165; NG/GT:1390; IV Piggyback:200] Out: 4350 [Urine:4350]  Lines, Tubes, etc: 4/21 ETT>> 4/25, ET tube 4/25>>>4/28 (self extubated during WUA) 4/21 LT IJ CVL>>  4/21 A Line>>  Microbiology: 4/21 BCx x 2>> NGTD 4/21 Urine cx>> NG 4/21 MRSA screen>>neg 4/23 Resp cx >> MSSA (resistant to PCN, intermediate resistance to erythro) 4/24 Blood x 2 >> NGTD 4/24 Sputum cx >> reincubated 4/24 Urine cx>> NG  Antibiotics:  Vancomycin 4/24>>> 4/26 Zosyn 4/24>>> 4/26 Nafcillin 4/26 >>> (anticipated stop date 4/31)  Studies/Events: 4/21 VF arrest  4/21 CT head>>> NAP 4/21 2D echo>>> Systolic function was severely reduced. The estimated ejection fraction was in the range of 10% to 20%. Severe diffuse hypokinesis.  Moderate MV regurg.  4/23: EEG >> 4/25: severe agitation following extubation; re-intubated for AMS/airwar protection  Consults:  4/22 Cardiology  Subjective: Pt alert and responsive to voice, tracks movement.  Still on significant sedation with versed and fentanyl gtt (3/150).  No acute events overnight   Physical  Examination: VItal signs reviewed . GEN: Pt appears critically ill.  Intubated and sedated. HEENT: head is autraumatic and normocephalic.   PERRL.  Sclerae anicteric.  Conjunctivae without pallor or injection. ETT in place. RESP:  Coarse sounds noted throughout bilateral lung fields. No wheezing. CARDIOVASCULAR: regular rate, normal rhythm.  Clear S1, S2. ABDOMEN: soft, non-tender, non-distended.  Bowels sounds present in all quadrants and normoactive.  No palpable masses. EXT: warm and dry.  Peripheral pulses equal, intact, and +2 globally.  No clubbing or cyanosis.  Trace edema in bilateral lower extremities. NEURO responds to voice and tactile stimuli. Able to follow commands.  Intake/Output Summary (Last 24 hours) at 09/16/11 0858 Last data filed at 09/16/11 0600  Gross per 24 hour  Intake   1408 ml  Output   1950 ml  Net   -542 ml   Ventilator settings: Vent Mode:  [-] PSV;CPAP FiO2 (%):  [40 %] 40 % Set Rate:  [14 bmp] 14 bmp Vt Set:  [500 mL] 500 mL PEEP:  [5 cmH20] 5 cmH20 Pressure Support:  [5 cmH20-10 cmH20] 5 cmH20 Plateau Pressure:  [14 cmH20-20 cmH20] 14 cmH20  Lab Results: Basic Metabolic Panel:  Lab 09/16/11 4098 09/15/11 0415 09/14/11 0330  NA 145 144 --  K 3.7 3.2* --  CL 101 100 --  CO2 30 31 --  GLUCOSE 129* 125* --  BUN 33* 25* --  CREATININE 2.19* 1.73* --  CALCIUM 9.1 9.5 --  MG -- 2.3 2.0  PHOS -- 5.8* 4.5   Liver  Function Tests:  Lab 09/12/11 0400 09/09/11 1213  AST 38* 253*  ALT 53* 138*  ALKPHOS 110 119*  BILITOT 0.7 0.6  PROT 6.0 7.2  ALBUMIN 2.5* 3.7   CBC:  Lab 09/15/11 0415 09/14/11 0330 09/09/11 1213  WBC 5.7 7.5 --  NEUTROABS -- -- 3.0  HGB 10.9* 10.9* --  HCT 31.8* 31.9* --  MCV 97.8 99.1 --  PLT 241 232 --   Cardiac Enzymes:  Lab 09/10/11 0350 09/09/11 2130 09/09/11 1354  CKTOTAL 1676* 996* 167  CKMB 15.5* 11.7* 2.3  CKMBINDEX -- -- --  TROPONINI 0.58* 0.95* <0.30   CBG:  Lab 09/16/11 0734 09/16/11 0259 09/16/11  0005 09/15/11 1936 09/15/11 1302 09/15/11 0957  GLUCAP 106* 134* 116* 115* 158* 146*   Coagulation:  Lab 09/09/11 1940 09/09/11 1213  LABPROT 15.1 13.4  INR 1.17 1.00   Urine Drug Screen: Drugs of Abuse     Component Value Date/Time   LABOPIA NONE DETECTED 09/09/2011 1513   COCAINSCRNUR POSITIVE* 09/09/2011 1513   LABBENZ POSITIVE* 09/09/2011 1513   AMPHETMU NONE DETECTED 09/09/2011 1513   THCU NONE DETECTED 09/09/2011 1513   LABBARB NONE DETECTED 09/09/2011 1513    Urinalysis:  Lab 09/12/11 1335 09/09/11 1254  COLORURINE YELLOW YELLOW  LABSPEC 1.008 1.012  PHURINE 6.0 6.0  GLUCOSEU NEGATIVE 500*  HGBUR SMALL* SMALL*  BILIRUBINUR NEGATIVE NEGATIVE  KETONESUR NEGATIVE NEGATIVE  PROTEINUR NEGATIVE 100*  UROBILINOGEN 1.0 0.2  NITRITE NEGATIVE NEGATIVE  LEUKOCYTESUR NEGATIVE NEGATIVE   Labs and Imaging:   Dg Chest Port 1 View  09/15/2011  *RADIOLOGY REPORT*  Clinical Data: Endotracheal tube placement  PORTABLE CHEST - 1 VIEW  Comparison: Chest radiograph 09/14/2011  Findings: Endotracheal tube, NG tube, and central venous line are unchanged.  Stable cardiac silhouette.  The perihilar air space disease more dense on the right which is slightly improved.  Left lower lobe atelectasis similar.  No pneumothorax.  IMPRESSION:  1.  Stable support apparatus. 2.  Some mild improvement in dense perihilar air space disease on the right.  Original Report Authenticated By: Genevive Bi, M.D.   Assessment and Plan:  VDRF 2/2 VF arrest:  VF arrest likely the result of cocaine use.  2D echo reveals severely reduced LV function with EF 10-20%.  Extubated after a successful trial but developed stridor and flash pulmonary edema requiring reintubation.  Will not attempt extubation today given extreme agitation, stridor, and edematous vocal cords during attempt on 4/25.    - staff note: self extubated 4/28 during WUA. No stridor  PLAN - staff note: Monitor, consider bipap, reintubate if needed -  Continue ASA. - Lopressor d/c'd 2/2 cocaine use will continue carvedilol - Cardiology following, plan to proceed with cath once mental status is accurately assessed. - CXR in am  RUL infiltrate, likely MSSA PNA: Resp cx from 4/23 with MSSA.  All blood cx and repeat sputum cx without growth. -  Nafcillin 2gm IV q4 and plan for total of 8 days abx - Continue to follow repeat cultures  Acute encephalopathy: likely the result of hypoxia/anoxic brain injury following VF arrest.  Has remained able to follow commands and track following full rewarming but develops significant agitation with decreased sedation. - Continue to monitor - EEG pending, will f/u results   ELECTROLYETS    Lab 09/16/11 0500 09/15/11 0415 09/14/11 0330 09/13/11 0400 09/12/11 0400 09/11/11 0400  NA 145 144 138 138 141 --  K 3.7 3.2* -- -- -- --  CL  101 100 99 103 110 --  CO2 30 31 26 26 22  --  GLUCOSE 129* 125* 117* 115* 102* --  BUN 33* 25* 21 15 11  --  CREATININE 2.19* 1.73* 1.59* 1.14* 1.06 --  CALCIUM 9.1 9.5 9.2 8.9 8.6 --  MG -- 2.3 2.0 2.2 1.8 2.0  PHOS -- 5.8* 4.5 2.7 2.2* 3.1   PLAN - Continue to replete with goal K of 4.0 and Mg with F/U lytes in AM.  Cocaine use: UDS positive for cocaine.  EtOH also probable given MCV > 100 and AST:ALT roughly 2:1. - Consider SW consult if patient has meaningful recovery - Low dose carvedilol since  4.26/13  Elevated LFTs:  AST:ALT elevated at 2:1 split suggesting EtOH use; hypotension and "shock liver" also likely contributing to abnormal liver enzymes likely due to ETOH.  LFTs improved.  - Continue to monitor  ARF: Cr continues to rise, likely from aggressive diuresis  Lab 09/16/11 0500 09/15/11 0415 09/14/11 0330 09/13/11 0400 09/12/11 0400  CREATININE 2.19* 1.73* 1.59* 1.14* 1.06   PLAN  - Discontinue scheduled lasix and diurese prn per discussion with cards - Continue to monitor Cr, electrolytes, and UOP - Consider renal u/s if Cr continues to  increase   Anemia, normocytic: Hbg stable at 10.    Lab 09/15/11 0415 09/14/11 0330 09/13/11 0400 09/12/11 0400 09/11/11 0400  HGB 10.9* 10.9* 10.7* 10.7* 12.5   PLAN - Continue to monitor - Transfuse only to maintain goal Hbg 8.0 in setting of MI   Best Practice: DVT: heparin  SUP: protonix Nutrition: TF Glycemic control: SSI Sedation/analgesia: Versed/Fentanyl  Nelda Bucks, PGY-3 09/16/2011 8:58 AM    STAFF NOTE: I, Dr Lavinia Sharps have personally reviewed patient's available data, including medical history, events of note, physical examination and test results as part of my evaluation. I have discussed with resident/NP and other care providers such as pharmacist, RN and RRT.  In addition,  I personally evaluated patient and elicited key findings of acute respiratory failure due to cardiac arrest and systolic CHF. She self extubated during wake up assessment which was normal. Wil monitor closely. Conisder bipap v reintubation if in respiratory distress. Of note, renal failure is worsening. IF in severe pulm edema might need to consider increaseing diuresis though she is already in negative balance.  Rest per NP/medical resident whose note is outlined above and that I agree with  The patient is critically ill with multiple organ systems failure and requires high complexity decision making for assessment and support, frequent evaluation and titration of therapies, application of advanced monitoring technologies and extensive interpretation of multiple databases.   Critical Care Time devoted to patient care services described in this note is  35  Minutes.  Dr. Kalman Shan, M.D., [facility].C.P Pulmonary and Critical Care Medicine Staff Physician [facility] Pager: [phone], If no answer or between  15:00h - 7:00h: call [phone]  09/16/2011 11:34 AM

## 2011-09-16 NOTE — Progress Notes (Signed)
Pt conversing with family members, laughing and joking.  Placed on 4L n/c after nasal passages now clearer.  Pt has been suctioning self freq. As well as blowing nose etc.  Had mod amts thick tan mucous.in nares and oro-pharynx.

## 2011-09-16 NOTE — Progress Notes (Signed)
PT has refused Bipap machine from two different therapist. We have tried to re-position the mask so it fits better and  we have offered to get another mask. PT has used obscene language to say she will not wear the Bipap. Pt has also said that if she has to get re-intubated she will pull it out again. PT is currently on nasal cannula at 4 lpm with o2 sats of 94%. Rt will continue to monitor.

## 2011-09-16 NOTE — Progress Notes (Signed)
eLink Physician-Brief Progress Note Patient Name: CARIS CERVENY DOB: Mar 18, 1951 MRN: [id number]  Date of Service  09/16/2011   HPI/Events of Note   Having some substernal pain and RN says does not look as good as before. Monitor BP 160 sbp  eICU Interventions  Fentanyl prn (dc versed) bipap monitor    Intervention Category Intermediate Interventions: Respiratory distress - evaluation and management;Other:  Tamatha Gadbois 09/16/2011, 5:02 PM

## 2011-09-16 NOTE — Progress Notes (Signed)
Pt suddenly c/o epigastric and subnsternal area chest discomfort - unable to determine if it feels like irritation from et tube or pressure-like pain - Pt appears weaker and more restless.  Dr. Bonney Aid notified of the above and orders received to start bipap.  Resp therapy notified - Orders obtained for permission to attempt po meds for bp.  New orders for antibiotic received.  Will leave femoral arterial line in place for now due to doubt pt able to tolerate head down and keeping leg still for any length of time - Has coughed up mod. amts thick tan tenaciouis sputum since self- extubated earlier today.  Pt suctioning oral cavity with mod assist at times.

## 2011-09-16 NOTE — Progress Notes (Signed)
Pt self extubated. No stridor present at this time. Pt is on a 4LNC with 02 saturations at 92% HR-101 135/64 RR-19

## 2011-09-16 NOTE — Progress Notes (Signed)
Subjective:  Intubated, awake and responsive, but remains easily aggitated. Does not appear to be in any distress  Objective:  Vital Signs in the last 24 hours: Temp:  [98.6 F (37 C)-99.2 F (37.3 C)] 98.6 F (37 C) (04/28 0736) Pulse Rate:  [84-107] 92  (04/28 0812) Resp:  [14-23] 14  (04/28 0600) BP: (86-159)/(48-85) 155/70 mmHg (04/28 0812) SpO2:  [92 %-99 %] 98 % (04/28 0812) Arterial Line BP: (82-231)/(46-104) 104/53 mmHg (04/28 0600) FiO2 (%):  [40 %] 40 % (04/28 0812) Weight:  [78.7 kg (173 lb 8 oz)] 78.7 kg (173 lb 8 oz) (04/28 0500)  Intake/Output from previous day:  Intake/Output Summary (Last 24 hours) at 09/16/11 0835 Last data filed at 09/16/11 0600  Gross per 24 hour  Intake   1408 ml  Output   1950 ml  Net   -542 ml    Physical Exam: General appearance: no distress and opens eyes to voice and follows some commands, but mostly just stares; awake & arousable. Neck: no adenopathy, no carotid bruit, no JVD, supple, symmetrical, trachea midline and thyroid not enlarged, symmetric, no tenderness/mass/nodules Lungs: clear to auscultation bilaterally and anterior lung fields -- on ventillator. Heart: regular rate and rhythm, no S3 or S4 and systolic murmur: holosystolic 1/6, blowing at apex Abdomen: normal findings: no bruits heard, no masses palpable, no organomegaly and soft, non-tender and abnormal findings:  hyperactive bowel sounds and that are high pitch Neurologic: Mental status: alertness: alert & follows some commands, moves arms purposely but appears somewhat agitated.   Rate: 94  Rhythm: normal sinus rhythm  Lab Results:  Basename 09/15/11 0415 09/14/11 0330  WBC 5.7 7.5  HGB 10.9* 10.9*  PLT 241 232    Basename 09/16/11 0500 09/15/11 0415  NA 145 144  K 3.7 3.2*  CL 101 100  CO2 30 31  GLUCOSE 129* 125*  BUN 33* 25*  CREATININE 2.19* 1.73*    Imaging:No CXR yet today Cardiac Studies:  Assessment/Plan:   Principal Problem:  *Cardiac  arrest - ventricular fibrillation, with Artic Sun Protocol Active Problems:  Acute respiratory failure with hypoxia, failed extubation yesterday  HIE (hypoxic-ischemic encephalopathy)  Dependence on respirator, status  Cardiomyopathy, severe by echo with EF 10-20%, 09/10/11  Renal insufficiency, SCr Nl on admission  MR (mitral regurgitation), moderate K+ replete -- maintain > 4.0   Renal Fx continues to worsen, but UOP stable -- not sure if this is due to diuresis, medication related, or simply late effect of presenting with VT arrest & labile BPs etc.  -- consider holding diuresis today as her oxygenation would not suggest significat pulmonary edema.  CXR not yet done for today, ? if the infiltrate is pulmonary edema vs. ARDS/PNA.   Continue with current afterload reduction and BB dose (to avoid masking tachycardia as a sign of infection, etc) -- BPs seem to have normalized.  I have a hard time recommending Milrinone at this time as she is able to mount BPs in the 150s on Coreg & Hydralazine/Nitrate.    She continues on weaning trials, but becomes quite agitated when sedation withheld.   She will need to be significantly more stable with renal function normalized before considering safely performing cardiac catheterization -- at this point, would not risk cath.     Marykay Lex, M.D., M.S. [facility] [address] [place], [place]  [address]  [phone] Pager # [phone]  09/16/2011 8:35 AM

## 2011-09-16 NOTE — Progress Notes (Signed)
Left Nare NG tube removed d/t migrating out of pt's nose. (originally placed by [employer] RN on 09/15/11).  New 14 Fr. OGT placed per [employer].  Confirmed placement by auscultation of two RNS.

## 2011-09-17 ENCOUNTER — Encounter (HOSPITAL_COMMUNITY): Payer: Self-pay | Admitting: *Deleted

## 2011-09-17 ENCOUNTER — Inpatient Hospital Stay (HOSPITAL_COMMUNITY): Payer: BC Managed Care – PPO

## 2011-09-17 DIAGNOSIS — E876 Hypokalemia: Secondary | ICD-10-CM | POA: Diagnosis not present

## 2011-09-17 DIAGNOSIS — F1410 Cocaine abuse, uncomplicated: Secondary | ICD-10-CM | POA: Diagnosis present

## 2011-09-17 LAB — CLOSTRIDIUM DIFFICILE BY PCR: Toxigenic C. Difficile by PCR: NEGATIVE

## 2011-09-17 LAB — BASIC METABOLIC PANEL
GFR calc non Af Amer: 27 mL/min — ABNORMAL LOW (ref >90)
Glucose, Bld: 98 mg/dL (ref 70–99)
Potassium: 3.1 mEq/L — ABNORMAL LOW (ref 3.5–5.1)
Sodium: 147 mEq/L — ABNORMAL HIGH (ref 135–145)

## 2011-09-17 LAB — GLUCOSE, CAPILLARY
Glucose-Capillary: 102 mg/dL — ABNORMAL HIGH (ref 70–99)
Glucose-Capillary: 112 mg/dL — ABNORMAL HIGH (ref 70–99)

## 2011-09-17 LAB — OCCULT BLOOD X 1 CARD TO LAB, STOOL: Fecal Occult Bld: NEGATIVE

## 2011-09-17 MED ORDER — POTASSIUM CHLORIDE CRYS ER 20 MEQ PO TBCR
40.0000 meq | EXTENDED_RELEASE_TABLET | Freq: Once | ORAL | Status: AC
  Administered 2011-09-17: 40 meq via ORAL
  Filled 2011-09-17: qty 2

## 2011-09-17 MED ORDER — FOLIC ACID 1 MG PO TABS
1.0000 mg | ORAL_TABLET | Freq: Every day | ORAL | Status: DC
  Administered 2011-09-17 – 2011-09-20 (×4): 1 mg via ORAL
  Filled 2011-09-17 (×4): qty 1

## 2011-09-17 MED ORDER — VITAMIN B-1 100 MG PO TABS
100.0000 mg | ORAL_TABLET | Freq: Every day | ORAL | Status: DC
  Administered 2011-09-17 – 2011-09-20 (×4): 100 mg via ORAL
  Filled 2011-09-17 (×4): qty 1

## 2011-09-17 MED ORDER — LORAZEPAM 2 MG/ML IJ SOLN
1.0000 mg | Freq: Four times a day (QID) | INTRAMUSCULAR | Status: DC | PRN
Start: 2011-09-17 — End: 2011-09-20

## 2011-09-17 MED ORDER — HYDROCODONE-ACETAMINOPHEN 5-325 MG PO TABS
1.0000 | ORAL_TABLET | Freq: Four times a day (QID) | ORAL | Status: DC | PRN
  Administered 2011-09-17 – 2011-09-20 (×3): 2 via ORAL
  Filled 2011-09-17 (×4): qty 2

## 2011-09-17 MED ORDER — PROSIGHT PO TABS
1.0000 | ORAL_TABLET | Freq: Every day | ORAL | Status: DC
  Administered 2011-09-17 – 2011-09-20 (×4): 1 via ORAL
  Filled 2011-09-17 (×4): qty 1

## 2011-09-17 NOTE — Progress Notes (Signed)
eLink Physician-Brief Progress Note Patient Name: Dawn Cruz DOB: Feb 07, 1951 MRN: [id number]  Date of Service  09/17/2011   HPI/Events of Note   Hypokalemia  eICU Interventions  Potassium replaced   Intervention Category Minor Interventions: Electrolytes abnormality - evaluation and management  Capone Schwinn 09/17/2011, 5:43 AM

## 2011-09-17 NOTE — Progress Notes (Signed)
Chaplain had brief encounter with patient's son Dorene Sorrow in the connecting hallway between [address] and the main hospital building. Chaplain experienced patient's son as very optimistic about his mother's recovery process. Follow up as needed.

## 2011-09-17 NOTE — Progress Notes (Signed)
Subjective:  Awake and alert-"I want a Pepsi"  Objective:  Vital Signs in the last 24 hours: Temp:  [99 F (37.2 C)-99.6 F (37.6 C)] 99.1 F (37.3 C) (04/29 0354) Pulse Rate:  [87-102] 93  (04/29 0754) Resp:  [14-23] 16  (04/29 0754) BP: (110-170)/(62-90) 152/66 mmHg (04/29 0600) SpO2:  [91 %-98 %] 97 % (04/29 0754) Arterial Line BP: (118-171)/(52-82) 158/75 mmHg (04/29 0754) Weight:  [76.4 kg (168 lb 6.9 oz)] 76.4 kg (168 lb 6.9 oz) (04/29 0500)  Intake/Output from previous day:  Intake/Output Summary (Last 24 hours) at 09/17/11 0824 Last data filed at 09/17/11 0500  Gross per 24 hour  Intake    693 ml  Output   1750 ml  Net  -1057 ml    Physical Exam: General appearance: alert, cooperative and no distress Lungs: decreased breath sounds Heart: regular rate and rhythm   Rate: 90  Rhythm: normal sinus rhythm  Lab Results:  Basename 09/15/11 0415  WBC 5.7  HGB 10.9*  PLT 241    Basename 09/17/11 0444 09/16/11 0500  NA 147* 145  K 3.1* 3.7  CL 106 101  CO2 26 30  GLUCOSE 98 129*  BUN 31* 33*  CREATININE 1.95* 2.19*   No results found for this basename: TROPONINI:2,CK,MB:2 in the last 72 hours Hepatic Function Panel No results found for this basename: PROT,ALBUMIN,AST,ALT,ALKPHOS,BILITOT,BILIDIR,IBILI in the last 72 hours No results found for this basename: CHOL in the last 72 hours No results found for this basename: INR in the last 72 hours  Imaging: Imaging results have been reviewed  Cardiac Studies:  Assessment/Plan:   Principal Problem:  *Cardiac arrest - ventricular fibrillation, with Artic Sun Protocol Active Problems:  Acute respiratory failure with hypoxia, self extubated 4/28  HIE (hypoxic-ischemic encephalopathy)  Cardiomyopathy, severe by echo with EF 10-20%, 09/10/11  Renal insufficiency, SCr Nl on admission  Cocaine abuse, drug screen positive  MR (mitral regurgitation), moderate  Hypokalemia  Plan- K+ ordered, will try and get to  chair today. D/C central line if OK with CCM. Swallowing eval this am. Tx to stepdown.  Corine Shelter PA-C 09/17/2011, 8:24 AM   I have seen and examined the patient along with Corine Shelter PA-C.  I have reviewed the chart, notes and new data.  I agree with PA's note.  Key new complaints: awake, alert, appears mostly oriented. Little insight into how sick she really is. Key examination changes: laterally displaced apical impulse, effaced. 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   Cannot exclude ischemic heart disease as well. Renal function volatility precludes coronary angiography. Consider nuclear perfusion study prior to discharge.   Meanwhile, vasodilator Rx (hydr/nitr, later add ACEi if creat better) and slowly titrated combined alpha-beta blocker (use carvedilol, avoid pure beta blockade) and prn diuretics. Reevaluate EF in 3 months.  Substance abuse counseling.  Thurmon Fair, MD, [facility] [phone] 09/17/2011, 8:28 AM

## 2011-09-17 NOTE — Plan of Care (Signed)
Problem: Phase II Progression Outcomes Goal: Code status addressed with pt/family Outcome: Completed/Met Date Met:  09/17/11 Pt. Is full code  Problem: Phase III Progression Outcomes Goal: Tolerating diet Outcome: Not Progressing Pt. Has little to no appetite Goal: Discharge plan established Outcome: Progressing Current plan to return home

## 2011-09-17 NOTE — Progress Notes (Signed)
Nutrition Follow-up  Diet Order:  Heart Pt was extubated on 4/25 and re-intubated 4/25. Pt self extubated on 4/28. Started on diet this morning, no meals documented yet. Patient sleeping soundly at time of RD visit, RD did not wake her. Breakfast tray was at bedside, had not been touched.    Meds: Scheduled Meds:   . antiseptic oral rinse  15 mL Mouth Rinse QID  . aspirin  81 mg Oral Daily  . carvedilol  6.25 mg Oral BID WC  .  ceFAZolin (ANCEF) IV  1 g Intravenous Q12H  . chlorhexidine  15 mL Mouth Rinse BID  . folic acid  1 mg Oral Daily  . heparin subcutaneous  5,000 Units Subcutaneous Q8H  . hydrALAZINE  25 mg Oral Q8H  . isosorbide dinitrate  10 mg Oral TID  . multivitamin  1 tablet Oral Daily  . potassium chloride  40 mEq Oral Once  . simvastatin  5 mg Oral q1800  . thiamine  100 mg Oral Daily  . DISCONTD: feeding supplement  30 mL Per Tube BID  . DISCONTD: folic acid  1 mg Intravenous Daily  . DISCONTD: insulin aspart  0-4 Units Subcutaneous Q4H  . DISCONTD: mulitivitamin  5 mL Oral Daily  . DISCONTD: nafcillin IV  2 g Intravenous Q4H  . DISCONTD: pantoprazole sodium  40 mg Per Tube Q1200  . DISCONTD: thiamine  100 mg Intravenous Daily   Continuous Infusions:   . DISCONTD: sodium chloride 20 mL/hr at 09/16/11 2230  . DISCONTD: sodium chloride 10 mL/hr at 09/15/11 0800  . DISCONTD: sodium chloride 20 mL/hr at 09/11/11 1533  . DISCONTD: dextrose    . DISCONTD: feeding supplement (PROMOTE) 1,000 mL (09/15/11 1023)  . DISCONTD: fentaNYL infusion INTRAVENOUS 50 mcg/hr (09/16/11 2002)  . DISCONTD: midazolam (VERSED) infusion 3 mg/hr (09/16/11 0700)  . DISCONTD: propofol Stopped (09/13/11 1300)   PRN Meds:.hydrALAZINE, HYDROcodone-acetaminophen, LORazepam, ondansetron (ZOFRAN) IV, DISCONTD: acetaminophen (TYLENOL) oral liquid 160 mg/5 mL, DISCONTD: fentaNYL, DISCONTD: fentaNYL, DISCONTD: midazolam, DISCONTD: midazolam  Labs:  CMP     Component Value Date/Time   NA 147*  09/17/2011 0444   K 3.1* 09/17/2011 0444   CL 106 09/17/2011 0444   CO2 26 09/17/2011 0444   GLUCOSE 98 09/17/2011 0444   BUN 31* 09/17/2011 0444   CREATININE 1.95* 09/17/2011 0444   CALCIUM 9.0 09/17/2011 0444   PROT 6.0 09/12/2011 0400   ALBUMIN 2.5* 09/12/2011 0400   AST 38* 09/12/2011 0400   ALT 53* 09/12/2011 0400   ALKPHOS 110 09/12/2011 0400   BILITOT 0.7 09/12/2011 0400   GFRNONAA 27* 09/17/2011 0444   GFRAA 31* 09/17/2011 0444     Intake/Output Summary (Last 24 hours) at 09/17/11 0956 Last data filed at 09/17/11 0800  Gross per 24 hour  Intake    615 ml  Output   2000 ml  Net  -1385 ml    Weight Status:  168 lbs, trending down, now just 2 lbs above admission weight  Re-estimated needs:  1700-1850 kcal, 65-75 gm protein   Nutrition Dx:  Inadequate oral intake now related to poor appetite as evidenced by no meals recorded.   Goal:  EN to meet >90% of nutrition needs, met d/c New Goal: PO intake to meet >90% of nutrition needs  Intervention:   1. RD will continue to monitor intake for supplement needs.  2. RD will follow  Monitor:  PO intake, weight, labs, I/O's   Rudean Haskell Pager #:  [phone]

## 2011-09-17 NOTE — Evaluation (Signed)
Clinical/Bedside Swallow Evaluation Patient Details  Name: Dawn Cruz MRN: [id number] DOB: [birth year] Today's Date: 09/17/2011  Past Medical History:  Past Medical History  Diagnosis Date  . Cardiomyopathy, severe by echo with EF 10-20% 09/11/2011  . MR (mitral regurgitation), moderate 09/11/2011   Past Surgical History: History reviewed. No pertinent past surgical history. HPI:  Pt is a 61 year old woman with VF arrest likely secondary to cocaine use. Pt also with hypoxia with possible anoxic brain injury. Pt was intubated from 4/12-4/25 did not toelrate extubation and reintubated until she self extubated on 4/28. Pt refused BiPAP or reintubation. Started on regular diet with thin liquids this morning, has tolerated well but does have some dysphonia. Pt denies any history of difficutly swallowing.    Assessment/Recommendations/Treatment Plan    SLP Assessment Clinical Impression Statement: Pt does not present with any overt s/s of aspiration of thin liquids though she may be at risk of silent aspiration given recent self extubation after 8 days of intubation. Pt will follow basic requests, but is resistant to recommendations given asipration precautions. At this time, recommend pt continue a regular diet with thin liquids. SLP will f/u x1 to test for continued tolerance given limited assessment at bedside though feel pt likely to be [facility].  Risk for Aspiration: Mild Other Related Risk Factors: Decreased respiratory status  Swallow Evaluation Recommendations Diet Recommendations: Regular;Thin liquid Liquid Administration via: Cup;Straw Medication Administration: Whole meds with puree Supervision: Intermittent supervision to cue for compensatory strategies Compensations: Slow rate;Small sips/bites Postural Changes and/or Swallow Maneuvers: Seated upright 90 degrees Oral Care Recommendations: Oral care BID Follow up Recommendations: None  Treatment Plan Treatment Plan  Recommendations: Therapy as outlined in treatment plan below Speech Therapy Frequency: min 1 x/week Treatment Duration: 1 week Interventions: Aspiration precaution training;Diet toleration management by SLP     Individuals Consulted Consulted and Agree with Results and Recommendations: Patient  Swallowing Goals  SLP Swallowing Goals Patient will consume recommended diet without observed clinical signs of aspiration with: Modified independent assistance Patient will utilize recommended strategies during swallow to increase swallowing safety with: Modified independent assistance  Harlon Ditty, MA CCC-SLP [phone]  Dawn Cruz, Dawn Cruz 09/17/2011,10:25 AM

## 2011-09-17 NOTE — Progress Notes (Signed)
Name: Dawn Cruz MRN: [id number] DOB: 1951-03-30    LOS: 8  PCCM Progress NOTE  History of Present Illness: 61 yo aaf who complained of chest pain and was witnessed to pass out. EMS response time 10 minutes. Found to be in Vfib, shocked x 2, epi x 2 and brief chest compression. Transported to ED, intubated, coughing on vent prior to sedation and NMB for hypothermia protocol.  PCCM to admit.    Vital Signs: Temp:  [98.4 F (36.9 C)-99.6 F (37.6 C)] 98.4 F (36.9 C) (04/29 0800) Pulse Rate:  [87-102] 92  (04/29 0800) Resp:  [14-23] 17  (04/29 0800) BP: (110-170)/(62-90) 152/66 mmHg (04/29 0600) SpO2:  [91 %-98 %] 98 % (04/29 0800) Arterial Line BP: (118-171)/(52-82) 169/82 mmHg (04/29 0800) Weight:  [168 lb 6.9 oz (76.4 kg)] 168 lb 6.9 oz (76.4 kg) (04/29 0500) I/O last 3 completed shifts: In: 1727 [P.O.:120; I.V.:907; NG/GT:500; IV Piggyback:200] Out: 2400 [Urine:2400]  Lines, Tubes, etc: 4/21 ETT>> 4/25, ET tube 4/25>>>4/28 (self extubated during WUA) 4/21 LT IJ CVL>>  4/21 A Line>>  Microbiology: 4/21 BCx x 2>> NGTD 4/21 Urine cx>> NG 4/21 MRSA screen>>neg 4/23 Resp cx >> MSSA (resistant to PCN, intermediate resistance to erythro) 4/24 Blood x 2 >> NGTD 4/24 Sputum cx >> reincubated 4/24 Urine cx>> NG  Antibiotics:  Vancomycin 4/24>>> 4/26 Zosyn 4/24>>> 4/26 Nafcillin 4/26 >>> 4/28 Ancef 4/28 >>> (anticipated stop date 5/1)  Studies/Events: 4/21 VF arrest  4/21 CT head>>> NAP 4/21 2D echo>>> Systolic function was severely reduced. The estimated ejection fraction was in the range of 10% to 20%. Severe diffuse hypokinesis.  Moderate MV regurg.  4/23: EEG >> This is an abnormal EEG secondary to general background slowing. This finding can be seen with a diffuse disturbance, it is etiologically nonspecific, but may include a metabolic encephalopathy or hypoxic encephalopathy among other possibilities. Evidence of reactivity was seen during the tracing.  4/25:  severe agitation following extubation; re-intubated for AMS/airwar protection  Consults:  4/22 Cardiology  Subjective: Pt alert and oriented.  States she has some chest discomfort surrounding her entire anterior chest wall.  Reports breathing improved but still more SOB than baseline.  States she would like to drink some coke.  No other concerns complaints. Refused BiPAP yesterday; no other acute events per RN.  Physical Examination: VItal signs reviewed . GEN: Pt in NAD.  A&Ox3. HEENT: head is autraumatic and normocephalic.   PERRL.  Sclerae anicteric.  Conjunctivae without pallor or injection. MMM RESP:  CTAB. CARDIOVASCULAR: regular rate, normal rhythm.  Clear S1, S2. ABDOMEN: soft, non-tender, non-distended.  Bowels sounds present in all quadrants and normoactive.  No palpable masses. EXT: warm and dry.  Peripheral pulses equal, intact, and +2 globally.  No clubbing or cyanosis.  Trace edema in bilateral lower extremities. NEURO: No focal deficit  Intake/Output Summary (Last 24 hours) at 09/17/11 0843 Last data filed at 09/17/11 0800  Gross per 24 hour  Intake    693 ml  Output   2000 ml  Net  -1307 ml   Lab Results: Basic Metabolic Panel:  Lab 09/17/11 4098 09/16/11 0500 09/15/11 0415 09/14/11 0330  NA 147* 145 -- --  K 3.1* 3.7 -- --  CL 106 101 -- --  CO2 26 30 -- --  GLUCOSE 98 129* -- --  BUN 31* 33* -- --  CREATININE 1.95* 2.19* -- --  CALCIUM 9.0 9.1 -- --  MG -- -- 2.3 2.0  PHOS -- --  5.8* 4.5   Liver Function Tests:  Lab 09/12/11 0400  AST 38*  ALT 53*  ALKPHOS 110  BILITOT 0.7  PROT 6.0  ALBUMIN 2.5*   CBC: Lab 09/15/11 0415 09/14/11 0330  WBC 5.7 7.5  NEUTROABS -- --  HGB 10.9* 10.9*  HCT 31.8* 31.9*  MCV 97.8 99.1  PLT 241 232   CBG: Lab 09/17/11 0752 09/17/11 0342 09/17/11 0003 09/16/11 1930 09/16/11 1450 09/16/11 1210  GLUCAP 109* 102* 112* 127* 115* 110*   Urine Drug Screen: Drugs of Abuse     Component Value Date/Time   LABOPIA  NONE DETECTED 09/09/2011 1513   COCAINSCRNUR POSITIVE* 09/09/2011 1513   LABBENZ POSITIVE* 09/09/2011 1513   AMPHETMU NONE DETECTED 09/09/2011 1513   THCU NONE DETECTED 09/09/2011 1513   LABBARB NONE DETECTED 09/09/2011 1513    Urinalysis: Lab 09/12/11 1335  COLORURINE YELLOW  LABSPEC 1.008  PHURINE 6.0  GLUCOSEU NEGATIVE  HGBUR SMALL*  BILIRUBINUR NEGATIVE  KETONESUR NEGATIVE  PROTEINUR NEGATIVE  UROBILINOGEN 1.0  NITRITE NEGATIVE  LEUKOCYTESUR NEGATIVE   Labs and Imaging:   Dg Chest Port 1 View  09/17/2011  *RADIOLOGY REPORT*  Clinical Data: Pulmonary edema.  PORTABLE CHEST - 1 VIEW  Comparison: Portable chest 09/15/2011.  Findings: The patient has been extubated.  Heart size is normal. Right perihilar airspace opacification is similar to the prior exam.  Left lower lobe airspace disease has increased slightly.  A mild interstitial pattern is noted.  IMPRESSION:  1.  Status post extubation. 2.  Increased left basilar airspace disease, likely reflecting atelectasis. 3.  Stable right perihilar opacification.  Original Report Authenticated By: Jamesetta Orleans. MATTERN, M.D.   Assessment and Plan:  VDRF 2/2 VF arrest:  VF arrest likely the result of cocaine use.  2D echo reveals severely reduced LV function with EF 10-20%.  Extubated after a successful trial but developed stridor and flash pulmonary edema requiring reintubation.  Pt self extubated on 4/28 during WUA.  - Continue to monitor. - D/C BiPAP. - Continue ASA. - Lopressor d/c'd 2/2 cocaine use will continue carvedilol. - Cardiology following, cannot proceed with cath 2/2 diminished renal function but may complete nuclear perfusion scan before d/c. - Imdur started on 4/27; pt tolerating this well so far. - WIll change all meds to oral formulations. - D/C a line and CVL. - PT/OT consult.  RUL infiltrate, likely MSSA PNA: Resp cx from 4/23 with MSSA.  All blood cx and repeat sputum cx are without growth.  Pt transitioned from  Nafcillin to Ancef on 4/28 as a result of rising Cr. -  Ancef 1gm IV q 12 and plan for total of 8 days abx (stop date 5/1) - Continue to follow repeat cultures  Acute encephalopathy: likely the result of hypoxia/anoxic brain injury following VF arrest.  EEG reveals non-specific findings c/w metabolic/anoxic encephalopathy. - Continue to monitor  Hypokalemia: likely the result of diuresis with IV Lasix and reduced oral intake,  Mag wnl as of 4/27. - Continue to replete prn with goal K of 4.0 - Continue to monitor  Cocaine use: UDS positive for cocaine.  EtOH also probable given MCV > 100 and AST:ALT roughly 2:1. - Will place SW consult for substance abuse. - Continue carvedilol  Elevated LFTs:  AST:ALT elevated at 2:1 split suggesting EtOH use; hypotension and "shock liver" also likely contributing to abnormal liver enzymes likely due to ETOH.  LFTs improved.  - Continue to monitor  ARF: Cr elevated above baselin, likely from  aggressive diuresis.  May be starting to slowly trend down. - Discontinue scheduled lasix and diurese prn per discussion with cards. - Continue to monitor Cr, electrolytes, and UOP. - Consider renal u/s if Cr continues to increase.  Anemia, normocytic: Hbg stable at 10. - Continue to monitor - Transfuse only to maintain goal Hbg 8.0 in setting of MI  Dispo: transfer to SDU today.    Best Practice: DVT: heparin  HQI:ONGE d/c protonix as there is no indication for continued stress ulcer prophylaxis Nutrition: heart healthy diet Glycemic control: not indicated.  CBGs wnl, no dx of DM.  Will d/c CBG checks and SSI Sedation/analgesia: will d/c fentanyl.  Vicodin prn pain.  Will transition to ativan prn for agitation/anxiety.  Nelda Bucks, PGY-3 09/17/2011 8:43 AM  Patient refused BiPAP overnight.  Seems to be doing well from a respiratory standpoint.  At this point, little from a PCCM standpoint to do.  Will transfer to SDU and to cardiology's service.  Left  recommendations above and will signoff, please call back if needed.  CC time 35 min.  Patient seen and examined, agree with above note.  I dictated the care and orders written for this patient under my direction.  Koren Bound, M.D. [phone]

## 2011-09-18 LAB — BASIC METABOLIC PANEL
CO2: 28 mEq/L (ref 19–32)
Chloride: 102 mEq/L (ref 96–112)
GFR calc Af Amer: 41 mL/min — ABNORMAL LOW (ref >90)
Sodium: 140 mEq/L (ref 135–145)

## 2011-09-18 LAB — CULTURE, BLOOD (ROUTINE X 2): Culture  Setup Time: 201304242323

## 2011-09-18 MED ORDER — POTASSIUM CHLORIDE CRYS ER 20 MEQ PO TBCR
40.0000 meq | EXTENDED_RELEASE_TABLET | Freq: Once | ORAL | Status: AC
  Administered 2011-09-18: 40 meq via ORAL
  Filled 2011-09-18: qty 2

## 2011-09-18 NOTE — Evaluation (Signed)
Occupational Therapy Evaluation Patient Details Name: Dawn Cruz MRN: [id number] DOB: [birth year] Today's Date: 09/18/2011 Time: 0945-1000 OT Time Calculation (min): 15 min  OT Assessment / Plan / Recommendation Clinical Impression  Pt s/p admit for cardia arrest due to nonisschemic cardiomyopathy who is not very close to her baseline.  Pt is fairly I with all adls.  No further acute OT necessary.    OT Assessment  Patient does not need any further OT services    Follow Up Recommendations  No OT follow up    Equipment Recommendations  None recommended by OT    Frequency      Precautions / Restrictions Precautions Precautions: None Restrictions Weight Bearing Restrictions: No   Pertinent Vitals/Pain Pt reports no pain.    ADL  Eating/Feeding: Performed;Independent Where Assessed - Eating/Feeding: Chair Grooming: Performed;Wash/dry hands;Wash/dry face;Modified independent;Not assessed (extra time given) Where Assessed - Grooming: Standing at sink Upper Body Bathing: Simulated;Set up Where Assessed - Upper Body Bathing: Sitting, chair Lower Body Bathing: Simulated;Set up Where Assessed - Lower Body Bathing: Sit to stand from chair Upper Body Dressing: Performed;Independent Where Assessed - Upper Body Dressing: Sitting, chair Lower Body Dressing: Performed;Set up Where Assessed - Lower Body Dressing: Sit to stand from chair Toilet Transfer: Performed;Modified independent Toilet Transfer Method: Proofreader: Comfort height toilet;Grab bars Toileting - Clothing Manipulation: Performed;Independent Where Assessed - Toileting Clothing Manipulation: Standing Toileting - Hygiene: Performed;Independent Where Assessed - Toileting Hygiene: Sit to stand from 3-in-1 or toilet Tub/Shower Transfer: Not assessed Ambulation Related to ADLs: Pt walked in room and picked up items off floor with S only.  Pt with no Lob in room.  PT to evaluate further  ambulation. ADL Comments: Pt I with most adls. Some were mod I due to increased time needed.    OT Goals    Visit Information  Last OT Received On: 09/18/11 Assistance Needed: +1    Subjective Data  Subjective: I can do all my self care. Patient Stated Goal: to go home.   Prior Functioning  Home Living Lives With: Family Available Help at Discharge: Available PRN/intermittently Type of Home: House Home Access: Stairs to enter Secretary/administrator of Steps: 4 Entrance Stairs-Rails: Right Home Layout: One level Bathroom Shower/Tub: Forensic scientist: Handicapped height Bathroom Accessibility: Yes How Accessible: Accessible via walker Home Adaptive Equipment: Grab bars in shower Prior Function Level of Independence: Independent Able to Take Stairs?: Yes Driving: Yes Vocation: Retired Musician: No difficulties Dominant Hand: Right    Cognition  Overall Cognitive Status: Appears within functional limits for tasks assessed/performed Arousal/Alertness: Awake/alert Orientation Level: Oriented X4 / Intact Behavior During Session: WFL for tasks performed Cognition - Other Comments: Pt was down for 10 minutes before CPR performed.  Pt's STM and LTM appear fully intact. Pt answered all cognitive questions appropriately.    Extremity/Trunk Assessment Right Upper Extremity Assessment RUE ROM/Strength/Tone: Within functional levels RUE Sensation: WFL - Light Touch RUE Coordination: WFL - gross/fine motor Left Upper Extremity Assessment LUE ROM/Strength/Tone: Within functional levels LUE Sensation: WFL - Light Touch LUE Coordination: WFL - gross/fine motor   Mobility Bed Mobility Bed Mobility: Supine to Sit;Sitting - Scoot to Edge of Bed Supine to Sit: 7: Independent Sitting - Scoot to Edge of Bed: 7: Independent Details for Bed Mobility Assistance: NO assist needed. Transfers Transfers: Sit to Stand;Stand to Sit Sit to Stand: 7:  Independent Stand to Sit: 7: Independent   Exercise    Balance Balance Balance Assessed: Yes  Dynamic Sitting Balance Dynamic Sitting - Comments: Pt independent sitting on bed reaching in all directions when doing adls. Dynamic Standing Balance Dynamic Standing - Comments: Pt able to reach out of base of support w/o assist when up on feet.  End of Session OT - End of Session Activity Tolerance: Patient tolerated treatment well Patient left: in bed;with call bell/phone within reach;with nursing in room Nurse Communication: Mobility status   Hope Budds 09/18/2011, 10:12 AM [phone]

## 2011-09-18 NOTE — Progress Notes (Signed)
   CARE MANAGEMENT NOTE 09/18/2011  Patient:  Dawn Cruz, Dawn Cruz   Account Number:  [id number]  Date Initiated:  09/11/2011  Documentation initiated by:  [facility]  Subjective/Objective Assessment:   Cardiac arrest - ?? post substance abuse.  Cooled, intubated.     Action/Plan:   Anticipated DC Date:  09/18/2011   Anticipated DC Plan:  HOME W HOME HEALTH SERVICES  In-house referral  Clinical Social Worker      DC Planning Services  CM consult      Choice offered to / List presented to:             Status of service:  Completed, signed off Medicare Important Message given?   (If response is "NO", the following Medicare IM given date fields will be blank) Date Medicare IM given:   Date Additional Medicare IM given:    Discharge Disposition:  HOME/SELF CARE  Per UR Regulation:  Reviewed for med. necessity/level of care/duration of stay  If discussed at Long Length of Stay Meetings, dates discussed:    Comments:  09-18-11 1116 Tomi Bamberger, RN,BSN [phone] CM saw that CSW consulted with pt this am for subtance abuse resources and pt declined. Upon arrival CM spoke with PT and stated that pt was at baseline and no HHPT needs recommended. Plan to transfer pt to tele today and plan for stress test in am and then possible d/c home after. CM spoke to pt and she states she lives with son and she will not need any home care services at this time. No further needs from CM at this itme.

## 2011-09-18 NOTE — Progress Notes (Signed)
Clinical Social Work Department BRIEF PSYCHOSOCIAL ASSESSMENT 09/18/2011  Patient:  DANELL, VAZQUEZ     Account Number:  [id number]     Admit date:  09/09/2011  Clinical Social Worker:  Hulan Fray  Date/Time:  09/18/2011 09:49 AM  Referred by:  Physician  Date Referred:  09/17/2011 Referred for  Substance Abuse   Other Referral:   Interview type:  Patient Other interview type:    PSYCHOSOCIAL DATA Living Status:  WITH ADULT CHILDREN Admitted from facility:   Level of care:   Primary support name:  Dorene Sorrow Primary support relationship to patient:  CHILD, ADULT Degree of support available:   supportive    CURRENT CONCERNS Current Concerns  None Noted   Other Concerns:    SOCIAL WORK ASSESSMENT / PLAN Clinincal Social Worker received referral for substance abuse counseling. Patient lives with her son and states she is retired. Patient states that her substance abuse is with alcohol. CSW completed SBIRT. CSW offered resources to patient but patient declined. Patient stated that she can control herself and "i'm too old for this mess." CSW informed patient that social work can be reconsulted if she is interested in resources. CSW will sign off as social work intervention is no longer needed.   Assessment/plan status:  No Further Intervention Required Other assessment/ plan:   Information/referral to community resources:    PATIENT'S/FAMILY'S RESPONSE TO PLAN OF CARE: Patient stated that she does not have a problem and that she can control herself.

## 2011-09-18 NOTE — Progress Notes (Signed)
Subjective:  No SOB, no diarrhea, not out of bed yet.   Objective:  Vital Signs in the last 24 hours: Temp:  [98.2 F (36.8 C)-98.9 F (37.2 C)] 98.5 F (36.9 C) (04/30 0700) Pulse Rate:  [68-89] 68  (04/30 0400) Resp:  [17-24] 20  (04/30 0700) BP: (120-132)/(54-78) 129/58 mmHg (04/30 0700) SpO2:  [97 %-100 %] 100 % (04/30 0400) Arterial Line BP: (158)/(78) 158/78 mmHg (04/29 0900) Weight:  [77.4 kg (170 lb 10.2 oz)] 77.4 kg (170 lb 10.2 oz) (04/30 0500)  Intake/Output from previous day:  Intake/Output Summary (Last 24 hours) at 09/18/11 0803 Last data filed at 09/18/11 0600  Gross per 24 hour  Intake   1126 ml  Output   1454 ml  Net   -328 ml    Physical Exam: General appearance: alert, cooperative and no distress Lungs: decreased at bases Heart: regular rate and rhythm   Rate: 68  Rhythm: normal sinus rhythm, PVCs  Lab Results: No results found for this basename: WBC:2,HGB:2,PLT:2 in the last 72 hours  Basename 09/18/11 0538 09/17/11 0444  NA 140 147*  K 3.0* 3.1*  CL 102 106  CO2 28 26  GLUCOSE 95 98  BUN 19 31*  CREATININE 1.56* 1.95*   No results found for this basename: TROPONINI:2,CK,MB:2 in the last 72 hours Hepatic Function Panel No results found for this basename: PROT,ALBUMIN,AST,ALT,ALKPHOS,BILITOT,BILIDIR,IBILI in the last 72 hours No results found for this basename: CHOL in the last 72 hours No results found for this basename: INR in the last 72 hours  Imaging: Imaging results have been reviewed  Cardiac Studies:  Assessment/Plan:   Principal Problem:  *Cardiac arrest - ventricular fibrillation, with Artic Sun Protocol Active Problems:  Acute respiratory failure with hypoxia, self extubated 4/28  Cardiomyopathy, severe by echo with EF 10-20%, 09/10/11  Renal insufficiency, SCr Nl on admission  Cocaine abuse, drug screen positive  MR (mitral regurgitation), moderate  Hypokalemia  Plan- Tx to telemetry. C-diff is negative, no need for  Rx. Mobilize. DC Ancef ordered on admission. Replace K+    Corine Shelter PA-C 09/18/2011, 8:03 AM

## 2011-09-18 NOTE — Plan of Care (Signed)
Problem: Phase III Progression Outcomes Goal: Cardiac Cath per orders Outcome: Not Applicable Date Met:  09/18/11 See Cardiology Service notes; plan possible stress test.

## 2011-09-18 NOTE — Plan of Care (Signed)
Problem: Phase III Progression Outcomes Goal: Tolerating diet Outcome: Completed/Met Date Met:  09/18/11 Appetite poor

## 2011-09-18 NOTE — Progress Notes (Signed)
Physical Therapy Evaluation  Past Medical History  Diagnosis Date  . Cardiomyopathy, severe by echo with EF 10-20% 09/11/2011  . MR (mitral regurgitation), moderate 09/11/2011   History reviewed. No pertinent past surgical history.  09/18/11 1100  PT Visit Information  Last PT Received On 09/18/11  Assistance Needed +1  PT Time Calculation  PT Start Time 1100  PT Stop Time 1115  PT Time Calculation (min) 15 min  Subjective Data  Subjective You wanna dance?  Patient Stated Goal Home  Precautions  Precautions None  Restrictions  Weight Bearing Restrictions No  Home Living  Lives With Family  Available Help at Discharge Available PRN/intermittently  Type of Home House  Home Access Stairs to enter  Entrance Stairs-Number of Steps 4  Entrance Stairs-Rails Right  Home Layout One level  Bathroom Shower/Tub Tub/shower unit;Curtain  Bathroom Toilet Handicapped height  Bathroom Accessibility Yes  How Accessible Accessible via walker  Home Adaptive Equipment Grab bars in shower  Prior Function  Level of Independence Independent  Able to Take Stairs? Yes  Driving Yes  Vocation Retired  Geneticist, molecular No difficulties  Cognition  Overall Cognitive Status Appears within functional limits for tasks assessed/performed  Arousal/Alertness Awake/alert  Orientation Level Oriented X4 / Intact  Behavior During Session [facility] for tasks performed  Cognition - Other Comments Pt was down for 10 minutes before CPR performed.  Pt's STM and LTM appear fully intact. Pt answered all cognitive questions appropriately.  Right Lower Extremity Assessment  RLE ROM/Strength/Tone WFL  Left Lower Extremity Assessment  LLE ROM/Strength/Tone WFL  Bed Mobility  Bed Mobility Supine to Sit;Sitting - Scoot to Edge of Bed;Sit to Supine  Supine to Sit 7: Independent  Sitting - Scoot to Edge of Bed 7: Independent  Sit to Supine 7: Independent  Details for Bed Mobility Assistance NO assist needed.    Transfers  Transfers Sit to Stand;Stand to Sit  Sit to Stand 7: Independent  Stand to Sit 7: Independent  Details for Transfer Assistance No Assist needed.    Ambulation/Gait  Ambulation/Gait Assistance 7: Independent  Ambulation Distance (Feet) 80 Feet  Assistive device None  Ambulation/Gait Assistance Details pt seems to be near baseline.  pt with mild LOB during turn, however feel pt trying to be funny and started dancing with turn.    Gait Pattern WFL  Stairs No  Wheelchair Mobility  Wheelchair Mobility No  Balance  Balance Assessed Yes  Dynamic Standing Balance  Dynamic Standing - Comments pt demos good recovery of balance during dancing.    PT - End of Session  Equipment Utilized During Treatment (None)  Activity Tolerance Patient tolerated treatment well  Patient left in bed;with call bell/phone within reach  Nurse Communication Mobility status  PT Assessment  Clinical Impression Statement pt presents s/p Cardiac arrest and Artic Sun Protocol.  pt seems to be near baseline level of function and able to move Independently.  No further PT needs at this time.    PT Recommendation/Assessment Patent does not need any further PT services  No Skilled PT All education completed;Patient at baseline level of functioning  PT Recommendation  Follow Up Recommendations No PT follow up  Equipment Recommended None recommended by PT  Written Expression  Dominant Hand Right    Mack Hook, PT [phone]

## 2011-09-18 NOTE — Progress Notes (Signed)
Pt. Seen and examined. Agree with the NP/PA-C note as written.  She is improving. Wants to go home. 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 Need to assess for ischemia. Creatinine is improving, however, a nuclear stress test is reasonable. Will arrange for tomorrow, probably ok to d/c after that if negative and she continues to improve. Consider starting ACE-I before discharge if creatinine is at baseline. Add low dose maintenance lasix - I's and O's are currently matched.  Transfer to telemetry today.  Chrystie Nose, MD, [facility] Attending Cardiologist [facility]

## 2011-09-19 ENCOUNTER — Inpatient Hospital Stay (HOSPITAL_COMMUNITY): Payer: BC Managed Care – PPO

## 2011-09-19 LAB — COMPREHENSIVE METABOLIC PANEL
ALT: 18 U/L (ref 0–35)
AST: 24 U/L (ref 0–37)
Albumin: 2.5 g/dL — ABNORMAL LOW (ref 3.5–5.2)
Alkaline Phosphatase: 105 U/L (ref 39–117)
BUN: 16 mg/dL (ref 6–23)
CO2: 26 mEq/L (ref 19–32)
Calcium: 8.9 mg/dL (ref 8.4–10.5)
Chloride: 101 mEq/L (ref 96–112)
Creatinine, Ser: 1.54 mg/dL — ABNORMAL HIGH (ref 0.50–1.10)
GFR calc Af Amer: 42 mL/min — ABNORMAL LOW (ref >90)
GFR calc non Af Amer: 36 mL/min — ABNORMAL LOW (ref >90)
Glucose, Bld: 90 mg/dL (ref 70–99)
Potassium: 3.3 mEq/L — ABNORMAL LOW (ref 3.5–5.1)
Sodium: 137 mEq/L (ref 135–145)
Total Bilirubin: 0.7 mg/dL (ref 0.3–1.2)
Total Protein: 7 g/dL (ref 6.0–8.3)

## 2011-09-19 MED ORDER — TECHNETIUM TC 99M TETROFOSMIN IV KIT
30.0000 | PACK | Freq: Once | INTRAVENOUS | Status: AC | PRN
  Administered 2011-09-19: 30 via INTRAVENOUS

## 2011-09-19 MED ORDER — ISOSORBIDE DINITRATE 20 MG PO TABS
20.0000 mg | ORAL_TABLET | Freq: Three times a day (TID) | ORAL | Status: DC
  Administered 2011-09-19 – 2011-09-20 (×3): 20 mg via ORAL
  Filled 2011-09-19 (×4): qty 1

## 2011-09-19 MED ORDER — ACETAMINOPHEN 325 MG PO TABS: 650.0000 mg | ORAL_TABLET | ORAL | Status: DC | PRN

## 2011-09-19 MED ORDER — ZOLPIDEM TARTRATE 5 MG PO TABS
10.0000 mg | ORAL_TABLET | Freq: Every evening | ORAL | Status: DC | PRN
  Administered 2011-09-19: 10 mg via ORAL
  Filled 2011-09-19: qty 2

## 2011-09-19 MED ORDER — POTASSIUM CHLORIDE CRYS ER 20 MEQ PO TBCR
40.0000 meq | EXTENDED_RELEASE_TABLET | Freq: Once | ORAL | Status: AC
  Administered 2011-09-19: 40 meq via ORAL
  Filled 2011-09-19: qty 2

## 2011-09-19 MED ORDER — TECHNETIUM TC 99M TETROFOSMIN IV KIT
10.0000 | PACK | Freq: Once | INTRAVENOUS | Status: AC | PRN
  Administered 2011-09-19: 10 via INTRAVENOUS

## 2011-09-19 MED ORDER — FUROSEMIDE 20 MG PO TABS
20.0000 mg | ORAL_TABLET | Freq: Every day | ORAL | Status: DC
  Administered 2011-09-20: 20 mg via ORAL
  Filled 2011-09-19: qty 1

## 2011-09-19 MED ORDER — REGADENOSON 0.4 MG/5ML IV SOLN
0.4000 mg | Freq: Once | INTRAVENOUS | Status: AC
  Administered 2011-09-19: 0.4 mg via INTRAVENOUS
  Filled 2011-09-19: qty 5

## 2011-09-19 NOTE — Progress Notes (Signed)
[facility]  Subjective: No Complaints.  Wants to go home. Ambulating in hall.  Objective: Vital signs in last 24 hours: Temp:  [98 F (36.7 C)-98.9 F (37.2 C)] 98 F (36.7 C) (05/01 0648) Pulse Rate:  [72-112] 72  (05/01 0648) Resp:  [18-20] 19  (05/01 0648) BP: (110-141)/(51-83) 128/74 mmHg (05/01 0648) SpO2:  [95 %-100 %] 99 % (05/01 0648) Weight:  [76.2 kg (167 lb 15.9 oz)] 76.2 kg (167 lb 15.9 oz) (05/01 0648) Last BM Date: 09/18/11  Intake/Output from previous day: 04/30 0701 - 05/01 0700 In: 625 [P.O.:625] Out: 2 [Emesis/NG output:1; Stool:1] Intake/Output this shift:   Medications Current Facility-Administered Medications  Medication Dose Route Frequency Provider Last Rate Last Dose  . aspirin chewable tablet 81 mg  81 mg Oral Daily Verdene Rio, MD   81 mg at 09/18/11 1000  . carvedilol (COREG) tablet 6.25 mg  6.25 mg Oral BID WC Alyson Reedy, MD   6.25 mg at 09/18/11 1820  . folic acid (FOLVITE) tablet 1 mg  1 mg Oral Daily Verdene Rio, MD   1 mg at 09/18/11 1000  . heparin injection 5,000 Units  5,000 Units Subcutaneous Q8H Verdene Rio, MD   5,000 Units at 09/19/11 [phone]  . hydrALAZINE (APRESOLINE) tablet 25 mg  25 mg Oral Q8H Marykay Lex, MD   25 mg at 09/19/11 0708  . HYDROcodone-acetaminophen (NORCO) 5-325 MG per tablet 1-2 tablet  1-2 tablet Oral Q6H PRN Verdene Rio, MD   2 tablet at 09/18/11 0117  . isosorbide dinitrate (ISORDIL) tablet 10 mg  10 mg Oral TID Marykay Lex, MD   10 mg at 09/18/11 2214  . LORazepam (ATIVAN) injection 1 mg  1 mg Intravenous Q6H PRN Verdene Rio, MD      . multivitamin (PROSIGHT) tablet 1 tablet  1 tablet Oral Daily Verdene Rio, MD   1 tablet at 09/18/11 1000  . ondansetron (ZOFRAN) injection 4 mg  4 mg Intravenous Q6H PRN Lonia Farber, MD   4 mg at 09/18/11 1205  . potassium chloride SA (K-DUR,KLOR-CON) CR tablet 40 mEq  40 mEq Oral Once Abelino Derrick, [place]   40  mEq at 09/18/11 0820  . potassium chloride SA (K-DUR,KLOR-CON) CR tablet 40 mEq  40 mEq Oral Once Abelino Derrick, [place]   40 mEq at 09/18/11 1311  . simvastatin (ZOCOR) tablet 5 mg  5 mg Oral q1800 Verdene Rio, MD   5 mg at 09/18/11 1820  . thiamine (VITAMIN B-1) tablet 100 mg  100 mg Oral Daily Verdene Rio, MD   100 mg at 09/18/11 1002   PE: General appearance: alert, cooperative and no distress Lungs: Slight wheeze left upper-mid Heart: regular rate and rhythm, S1, S2 normal, no murmur, click, rub or gallop Extremities: No LEE Pulses: 2+ and symmetric Upper airway wheeze (probably from laryngeal edema post ETT  Lab Results:  No results found for this basename: WBC:3,HGB:3,HCT:3,PLT:3 in the last 72 hours BMET  [facility] 09/19/11 0620 09/18/11 0538 09/17/11 0444  NA 137 140 147*  K 3.3* 3.0* 3.1*  CL 101 102 106  CO2 26 28 26   GLUCOSE 90 95 98  BUN 16 19 31*  CREATININE 1.54* 1.56* 1.95*  CALCIUM 8.9 8.9 9.0    Assessment/Plan  Principal Problem:  *Cardiac arrest - ventricular fibrillation, with Artic Sun Protocol Active Problems:  Acute respiratory failure with hypoxia, self extubated 4/28  Cardiomyopathy, severe by echo with EF 10-20%, 09/10/11  MR (mitral regurgitation), moderate  Renal insufficiency, SCr Nl on admission  Cocaine abuse, drug screen positive  Hypokalemia  Plan:  Lexiscan myoview today.  BP 110/70 - 141/83.  SCr slightly improved to 1.54.  EF 10-20%.  Needs ACE-I at some point.  Replete potassium.  Telemetry shows multiple PVCs and PACs.  Question need for lifevest prior to DC.   LOS: 10 days    HAGER,BRYAN W 09/19/2011 8:02 AM  ATTENDING ATTESTATION:  I have seen and examined the patient along with BRYAN HAGER, PA.  I have reviewed the chart, notes and new data.  I agree with BRYAN's note.  Brief Description: 61 y/o woman, remarkably recovered s/p VT arrest (probably cocaine induced) with severe cardiomyopathy.  S/p Artic sun.  Complicated  intubation course & completed course of Abx for tracheobronchits.  Now extubated with stable BP & HR on goo basal dose of Carvedilol & Hydralazine/Nitrate (ACE-I / ARB currently on hold due to recovering renal function (would anticipate transition as OP).  Still has some stridor.  Renal function improved & appears to have plateau'd.  Lexiscan Myoview today -- no reversible defect (? Apical fixed defect - most likely breast attenuation, but possibly scar), EF estimated ~25-30% (which if accurate would suggest dramatic improvement of function from admission Echo.  PLAN:  Will increase Isordil to 20mg  tid & add low dose lasix, continue current BB & Hydralazine dose along  Anticipate adding Spironolactone & converting to ACE-I as OP once we are sure that renal function is truly stable.  With VT/VF on admission & PVCs on tele in setting of severe (now more likely) Non-ischemic CM, will plan to d/c with LifeVest.  I had an extensive conversation with her about her PSA / EtOH history and concerns for her future post discharge.  She swears that she made a mistake & promises to stay "clean", eat right & do what is recommended for her.  She seems sincere in her resolve & teared up when recalling having the discussion with her son re: her urine tox screen results.    I explained to her the possible fatal interaction of cocaine & Coreg.    With this sense of sincerity in willingness to "tow the line", she also agreed to proceed with AICD if EF not recovered in ~3 months. --> LifeVest paperwork done, to be fitted pre-discharge tomorrow.  Continue statin as liver function has improved post shock.  Will need to check with PCCM re: recommendations for her stridor if any.  Will arrange f/u in clinic within 1 week (to see PA, NP), check labs at that time (as well as spot Utox) prior to possibly converting to ACE-I/ARB & spironolactone.  Anticipate d/c tomorrow once LifeVest fitted & in-service  provided.  Marykay Lex, M.D., M.S. [facility] [address] [place], [place]  [address]  [phone]  09/19/2011 3:46 PM

## 2011-09-19 NOTE — Progress Notes (Addendum)
Pt was discussed in Long length of stay meeting  today. Gala Lewandowsky, RN,BSN [phone]

## 2011-09-20 LAB — BASIC METABOLIC PANEL
BUN: 13 mg/dL (ref 6–23)
Chloride: 101 mEq/L (ref 96–112)
GFR calc Af Amer: 45 mL/min — ABNORMAL LOW (ref >90)
Potassium: 3.1 mEq/L — ABNORMAL LOW (ref 3.5–5.1)

## 2011-09-20 MED ORDER — THIAMINE HCL 100 MG PO TABS: 100.0000 mg | ORAL_TABLET | Freq: Every day | ORAL | Status: DC

## 2011-09-20 MED ORDER — HYDRALAZINE HCL 25 MG PO TABS: 25.0000 mg | ORAL_TABLET | Freq: Three times a day (TID) | ORAL | Status: DC

## 2011-09-20 MED ORDER — POTASSIUM CHLORIDE CRYS ER 20 MEQ PO TBCR
40.0000 meq | EXTENDED_RELEASE_TABLET | Freq: Once | ORAL | Status: AC
  Administered 2011-09-20: 40 meq via ORAL
  Filled 2011-09-20: qty 2
  Filled 2011-09-20: qty 1

## 2011-09-20 MED ORDER — PROSIGHT PO TABS: 1.0000 | ORAL_TABLET | Freq: Every day | ORAL | Status: DC

## 2011-09-20 MED ORDER — SIMVASTATIN 5 MG PO TABS: 5.0000 mg | ORAL_TABLET | Freq: Every day | ORAL | Status: DC

## 2011-09-20 MED ORDER — ASPIRIN 81 MG PO CHEW: 81.0000 mg | CHEWABLE_TABLET | Freq: Every day | ORAL | Status: AC

## 2011-09-20 MED ORDER — ZOLPIDEM TARTRATE 10 MG PO TABS: 10.0000 mg | ORAL_TABLET | Freq: Every evening | ORAL | Status: DC | PRN

## 2011-09-20 MED ORDER — POTASSIUM CHLORIDE CRYS ER 20 MEQ PO TBCR: 10.0000 meq | EXTENDED_RELEASE_TABLET | Freq: Once | ORAL | Status: DC

## 2011-09-20 MED ORDER — CARVEDILOL 3.125 MG PO TABS: 9.3750 mg | ORAL_TABLET | Freq: Two times a day (BID) | ORAL | Status: DC

## 2011-09-20 MED ORDER — ISOSORBIDE DINITRATE 20 MG PO TABS: 20.0000 mg | ORAL_TABLET | Freq: Three times a day (TID) | ORAL | Status: DC

## 2011-09-20 MED ORDER — FOLIC ACID 1 MG PO TABS: 1.0000 mg | ORAL_TABLET | Freq: Every day | ORAL | Status: DC

## 2011-09-20 MED ORDER — CARVEDILOL 6.25 MG PO TABS
9.3750 mg | ORAL_TABLET | Freq: Two times a day (BID) | ORAL | Status: DC
  Filled 2011-09-20 (×2): qty 1

## 2011-09-20 MED ORDER — FUROSEMIDE 20 MG PO TABS: 20.0000 mg | ORAL_TABLET | Freq: Every day | ORAL | Status: DC

## 2011-09-20 MED ORDER — POTASSIUM CHLORIDE CRYS ER 20 MEQ PO TBCR: 20.0000 meq | EXTENDED_RELEASE_TABLET | Freq: Once | ORAL | Status: DC

## 2011-09-20 NOTE — Progress Notes (Signed)
OT Screen:  Spoke to pt who states she is at baseline level of functioning with further acute OT needs or DME at this time. Will sign off. Thanks! Glendale Chard, OTR/L Pager: [phone] 09/20/2011    Toniesha Zellner 09/20/2011, 1:34 PM

## 2011-09-20 NOTE — Progress Notes (Signed)
Pt given discharge instructions, medication list, follow up appointments and when to call the doctor.  Life vest given to pt and instructions given.  Pt verbalized understanding of instructions. Pt is aware that she needs to take one potassium tablet (20 mEq) with her dinner tonight.   Thomas Hoff

## 2011-09-20 NOTE — Progress Notes (Signed)
   CARE MANAGEMENT NOTE 09/20/2011  Patient:  Dawn Cruz, Dawn Cruz   Account Number:  [id number]  Date Initiated:  09/11/2011  Documentation initiated by:  [facility]  Subjective/Objective Assessment:   Cardiac arrest - ?? post substance abuse.  Cooled, intubated.     Action/Plan:   Anticipated DC Date:  09/18/2011   Anticipated DC Plan:  HOME W HOME HEALTH SERVICES  In-house referral  Clinical Social Worker      DC Planning Services  CM consult      Choice offered to / List presented to:     DME arranged  VEST - LIFE VEST      DME agency  OTHER - SEE NOTE        Status of service:  Completed, signed off Medicare Important Message given?   (If response is "NO", the following Medicare IM given date fields will be blank) Date Medicare IM given:   Date Additional Medicare IM given:    Discharge Disposition:  HOME/SELF CARE  Per UR Regulation:  Reviewed for med. necessity/level of care/duration of stay  If discussed at Long Length of Stay Meetings, dates discussed:   09/19/2011    Comments:  09-20-11 [address], RN,BSN [phone] CM called Morrie Sheldon with  [employer] Life vest and the pt will be fit today. Morrie Sheldon did educate pt and pt is agreeable for vest. Plan for fit this evening and d/c after. No further needs from CM at this time.  09-19-11 [address], [place] [phone] Pt was discussed in Long length of stay meeting  today.  09-18-11 [address], [place] [phone] CM saw that CSW consulted with pt this am for subtance abuse resources and pt declined. Upon arrival CM spoke with PT and stated that pt was at baseline and no HHPT needs recommended. Plan to transfer pt to tele today and plan for stress test in am and then possible d/c home after. CM spoke to pt and she states she lives with son and she will not need any home care services at this time. No further needs from CM at this itme.
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
## 2011-09-24 NOTE — Discharge Summary (Signed)
Physician Discharge Summary  Patient ID: Dawn Cruz MRN: [id number] DOB/AGE: May 26, 1950 61 y.o.  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 Otherwise healthy. Takes no medications according to the family. She was recently retired from [employer]. The family was with her during the arrest. They stated she said, "I feel Dizzy" Then fell to the floor and became rigid-like. EMS responded in ten minutes(no CPR during that time) and found her in VFib and shocked her twice with epi x2, chest compressions and intubation. Initial cardiac enzymes were negative but troponin peaked at 0.95, ckmb of 15.5 and total ck of 1676.  ECG is without acute ST changes but suggests LVH with repolarization.  She was placed on [facility] protocol.  UDS was positive for cocaine and benzodiazepines.  An arterial catheter was inserted.   Potassium was repleted.   Mag WNL.  Rewarming phase on 09/10/11.  2D Echo showed EF of 10-20% with Severe diffuse hypokinesis(See full report below).  Nutrition assessment was completed.  EEG was completed and was an abnormal EEG secondary to general background slowing. This finding can be seen with a diffuse disturbance, it is etiologically nonspecific, but may include a metabolic encephalopathy or hypoxic encephalopathy among other possibilities. Evidence of reactivity was seen during the tracing.  The  patient was taken off pressors.  She continued to have low grade fever and was extubated on 09/13/11 but then reintubated.   Coreg was started. Resp cx from 4/23 with MSSA. All blood cx and repeat sputum cx without growth. The patient ultimately self extubated on 4/28 and a nuclear stress test revealed no ischemia and an EF of 28%.  Renal insufficiency was likely related to aggressive diuresis. RUL infiltrate was treated with nafcillin initially then transitioned to Ancef.  Due to myopathy it was arranged for the patient to have a Retail banker for primary prevention.  She will have a 2D echo in three months to reevaluate LV function.  ACE-I will be held until renal function improves.  This will be reassessed during OP follow-up.  Coreg was titrated up prior to D/C.  She was seen by Dr. Tresa Endo who felt she was stable for DC home.   Consults: Cardiology  Significant Diagnostic Studies:  2D-Echo Study Conclusions  - Left ventricle: The cavity size was moderately dilated. There was moderate Eccentric Hypertrophy. Systolic function was severely reduced. The estimated ejection fraction was in the range of 10% to 20%. Severe diffuse hypokinesis. Although no diagnostic regional wall motion abnormality was identified, this possibility cannot be completely excluded on the basis of this study. Doppler parameters are consistent with elevated mean left atrial filling pressure - in part due to MR. - Mitral valve: Moderate regurgitation with a central jet. - Left atrium: The atrium was moderately dilated. - Right ventricle: Systolic function was mildly reduced. Impressions:  - Study suggests left ventricular dysfunction without congestive heart failure, with elevated left-sided pressure and severely reduced cardiac output.  The dysfunction is both systolic and diastolic. __________________________________________________________________________  EEG  REFERRING PHYSICIAN: Felipa Evener, MD    HISTORY: A 61 year old female on the ventilator after arrest.  MEDICATIONS: Fentanyl, Versed, heparin, NovoLog, Levophed.  CONDITIONS OF RECORDING: This is a 16 channel EEG carried out with the  patient in the lethargic state.  DESCRIPTION: Background activity consists of a polymorphic delta rhythm  of 1-3 Hz. This is poorly organized and seen over all quadrants. There  is superimposed beta activity that is seen throughout the tracing. No  epileptiform activity is noted. Hypoventilation was performed. It  produced no change in the tracing. Hypoventilation and intermittent  photic stimulation were not performed. The patient was stimulated  during the tracing. Some reactivity was noted with stimulation.  IMPRESSION: This is an abnormal EEG secondary to general background  slowing. This finding can be seen with a diffuse disturbance, it is  etiologically nonspecific, but may include a metabolic encephalopathy or  hypoxic encephalopathy among other possibilities. Evidence of  reactivity was seen during the tracing.  __________________________________________________________________________  MYOCARDIAL IMAGING WITH SPECT (REST AND PHARMACOLOGIC-STRESS)  GATED LEFT VENTRICULAR WALL MOTION STUDY  LEFT VENTRICULAR EJECTION FRACTION  Technique: Standard myocardial SPECT imaging was performed after  resting intravenous injection of 10 mCi Tc-49m tetrofosmin.  Subsequently, intravenous infusion of Lexiscan was performed under  the supervision of the Cardiology staff. At peak effect of the  drug, 30 mCi Tc-70m tetrofosmin was injected intravenously and  standard myocardial SPECT imaging was performed. Quantitative  gated imaging was also performed to evaluate left ventricular wall  motion, and estimate left ventricular ejection fraction.  Comparison: None.  Findings: multiplanar SPECT imaging shows fixed small anteroseptal  defect consistent with attenuation or scar. There is no evidence  for  inducible perfusion abnormality within the left ventricular  myocardium.  Images obtained with cardiac gating displayed using a surface  rendering algorithm reveal mid and distal septal dyskinesia with  inferior and apical hypokinesia.  Left ventricular end-diastolic volume is calculated at 180 ml. The  left ventricular end systolic volume is calculated 782 ml. The  derived left ventricular ejection fraction is 28%.  IMPRESSION:  No evidence for inducible ischemia.  LVEF of 28%. _________________________________________________________________________  PORTABLE CHEST - 1 VIEW  Comparison: Portable chest 09/15/2011.  Findings: The patient has been extubated. Heart size is normal.  Right perihilar airspace opacification is similar to the prior  exam. Left lower lobe airspace disease has increased slightly. A  mild interstitial pattern is noted.  IMPRESSION:  1. Status post extubation.  2. Increased left basilar airspace disease, likely reflecting  atelectasis.  3. Stable right perihilar opacification. __________________________________________________________________________  CT HEAD WITHOUT CONTRAST  Technique: Contiguous axial images were obtained from the base of  the skull through the vertex without contrast.  Comparison: 09/09/2011  Findings: Endotracheal and nasogastric tubes partly visualized.  Allowing for differences in orientation and scan plane, there is no  change in ventricular diameter. No acute hemorrhage, acute  infarction, or mass lesion is seen. No midline shift. Orbits and  paranasal sinuses are intact.  IMPRESSION:  Allowing for suboptimal visualization of portable technique, no  gross evidence for anoxic brain injury or acute intracranial  finding. ___________________________________________________________________________  CT HEAD WITHOUT CONTRAST  Technique: Contiguous axial images were obtained from the base of  the skull through the vertex without  contrast.  Comparison: None.  Findings: Bone windows demonstrate no significant soft tissue  swelling. Mild hyperostosis frontalis interna. Clear paranasal  sinuses and  mastoid air cells.  Soft tissue windows demonstrate physiologic calcifications within  the basal ganglia. Suspect mild small vessel ischemic change,  primarily adjacent the posterior horns of the lateral ventricles.  No mass lesion, hemorrhage, hydrocephalus, acute infarct, intra-  axial, or extra-axial fluid collection.  IMPRESSION:  1. No acute intracranial abnormality.  2. Suspect mild small vessel ischemic change.  CBC    Component Value Date/Time   WBC 5.7 09/15/2011 0415   RBC 3.25* 09/15/2011 0415   HGB 10.9* 09/15/2011 0415   HCT 31.8* 09/15/2011 0415   PLT 241 09/15/2011 0415   MCV 97.8 09/15/2011 0415   MCH 33.5 09/15/2011 0415   MCHC 34.3 09/15/2011 0415   RDW 14.1 09/15/2011 0415   LYMPHSABS 6.6* 09/09/2011 1213   MONOABS 0.4 09/09/2011 1213   EOSABS 0.2 09/09/2011 1213   BASOSABS 0.1 09/09/2011 1213   BMET    Component Value Date/Time   NA 139 09/20/2011 0830   K 3.1* 09/20/2011 0830   CL 101 09/20/2011 0830   CO2 23 09/20/2011 0830   GLUCOSE 113* 09/20/2011 0830   BUN 13 09/20/2011 0830   CREATININE 1.45* 09/20/2011 0830   CALCIUM 9.3 09/20/2011 0830   GFRNONAA 39* 09/20/2011 0830   GFRAA 45* 09/20/2011 0830    Treatments: [facility] Protocol  Discharge Exam: Blood pressure 124/69, pulse 66, temperature 98.6 F (37 C), temperature source Oral, resp. rate 20, height 5\' 8"  (1.727 m), weight 78.4 kg (172 lb 13.5 oz), SpO2 99.00%.   Disposition: 01-Home or Self Care  Discharge Orders    Future Orders Please Complete By Expires   Diet - low sodium heart healthy      Increase activity slowly        Medication List  As of 09/24/2011 11:03 AM   TAKE these medications         aspirin 81 MG chewable tablet   Chew 1 tablet (81 mg total) by mouth daily.      carvedilol 3.125 MG tablet   Commonly known as: COREG    Take 3 tablets (9.375 mg total) by mouth 2 (two) times daily with a meal.      diphenhydramine-acetaminophen 25-500 MG Tabs   Commonly known as: TYLENOL PM   Take 1 tablet by mouth at bedtime as needed. For cold symptoms      folic acid 1 MG tablet   Commonly known as: FOLVITE   Take 1 tablet (1 mg total) by mouth daily.      furosemide 20 MG tablet   Commonly known as: LASIX   Take 1 tablet (20 mg total) by mouth daily.      hydrALAZINE 25 MG tablet   Commonly known as: APRESOLINE   Take 1 tablet (25 mg total) by mouth every 8 (eight) hours.      isosorbide dinitrate 20 MG tablet   Commonly known as: ISORDIL   Take 1 tablet (20 mg total) by mouth 3 (three) times daily.      multivitamin Tabs   Take 1 tablet by mouth daily.      potassium chloride SA 20 MEQ tablet   Commonly known as: K-DUR,KLOR-CON   Take 0.5 tablets (10 mEq total) by mouth once.      simvastatin 5 MG tablet   Commonly known as: ZOCOR   Take 1 tablet (5 mg total) by mouth daily at 6 PM.      thiamine 100 MG tablet   Take 1 tablet (100 mg total)  by mouth daily.      zolpidem 10 MG tablet   Commonly known as: AMBIEN   Take 1 tablet (10 mg total) by mouth at bedtime as needed for sleep.             SignedDwana Melena 09/24/2011, 11:03 AM

## 2011-10-20 DEATH — deceased

## 2012-06-25 ENCOUNTER — Encounter (HOSPITAL_COMMUNITY): Payer: Self-pay | Admitting: Emergency Medicine

## 2012-06-25 ENCOUNTER — Emergency Department (HOSPITAL_COMMUNITY): Payer: BC Managed Care – PPO

## 2012-06-25 ENCOUNTER — Emergency Department (HOSPITAL_COMMUNITY)
Admission: EM | Admit: 2012-06-25 | Discharge: 2012-06-25 | Disposition: A | Discharge status: Home / Self Care | Payer: BC Managed Care – PPO | Attending: Emergency Medicine | Admitting: Emergency Medicine

## 2012-06-25 DIAGNOSIS — Y939 Activity, unspecified: Secondary | ICD-10-CM | POA: Insufficient documentation

## 2012-06-25 DIAGNOSIS — IMO0002 Reserved for concepts with insufficient information to code with codable children: Secondary | ICD-10-CM | POA: Insufficient documentation

## 2012-06-25 DIAGNOSIS — M79641 Pain in right hand: Secondary | ICD-10-CM

## 2012-06-25 DIAGNOSIS — Z8679 Personal history of other diseases of the circulatory system: Secondary | ICD-10-CM | POA: Insufficient documentation

## 2012-06-25 DIAGNOSIS — S59909A Unspecified injury of unspecified elbow, initial encounter: Secondary | ICD-10-CM | POA: Insufficient documentation

## 2012-06-25 DIAGNOSIS — F17200 Nicotine dependence, unspecified, uncomplicated: Secondary | ICD-10-CM | POA: Insufficient documentation

## 2012-06-25 DIAGNOSIS — Z7982 Long term (current) use of aspirin: Secondary | ICD-10-CM | POA: Insufficient documentation

## 2012-06-25 DIAGNOSIS — S0993XA Unspecified injury of face, initial encounter: Secondary | ICD-10-CM | POA: Insufficient documentation

## 2012-06-25 DIAGNOSIS — Y92410 Unspecified street and highway as the place of occurrence of the external cause: Secondary | ICD-10-CM | POA: Insufficient documentation

## 2012-06-25 DIAGNOSIS — I252 Old myocardial infarction: Secondary | ICD-10-CM | POA: Insufficient documentation

## 2012-06-25 DIAGNOSIS — Z79899 Other long term (current) drug therapy: Secondary | ICD-10-CM | POA: Insufficient documentation

## 2012-06-25 DIAGNOSIS — S6990XA Unspecified injury of unspecified wrist, hand and finger(s), initial encounter: Secondary | ICD-10-CM | POA: Insufficient documentation

## 2012-06-25 HISTORY — DX: Acute myocardial infarction, unspecified: I21.9

## 2012-06-25 MED ORDER — CYCLOBENZAPRINE HCL 10 MG PO TABS: 10.0000 mg | ORAL_TABLET | Freq: Two times a day (BID) | ORAL | Status: DC | PRN
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
## 2012-06-26 NOTE — ED Provider Notes (Signed)
Medical screening examination/treatment/procedure(s) were performed by non-physician practitioner and as supervising physician I was immediately available for consultation/collaboration.   Christopher J. Pollina, MD 06/26/12 1058 

## 2013-08-11 ENCOUNTER — Ambulatory Visit (INDEPENDENT_AMBULATORY_CARE_PROVIDER_SITE_OTHER): Payer: BC Managed Care – PPO | Admitting: Family Medicine

## 2013-08-11 VITALS — BP 153/76 | HR 80 | Temp 98.5°F | Resp 18 | Ht 66.0 in | Wt 180.6 lb

## 2013-08-11 DIAGNOSIS — Z0000 Encounter for general adult medical examination without abnormal findings: Secondary | ICD-10-CM

## 2013-08-11 DIAGNOSIS — G4700 Insomnia, unspecified: Secondary | ICD-10-CM

## 2013-08-11 DIAGNOSIS — Z8674 Personal history of sudden cardiac arrest: Secondary | ICD-10-CM

## 2013-08-11 LAB — POCT CBC
Granulocyte percent: 54.3 %G (ref 37–80)
HEMATOCRIT: 43.1 % (ref 37.7–47.9)
HEMOGLOBIN: 13.6 g/dL (ref 12.2–16.2)
LYMPH, POC: 2.7 (ref 0.6–3.4)
MCH, POC: 33.1 pg — AB (ref 27–31.2)
MCHC: 31.6 g/dL — AB (ref 31.8–35.4)
MCV: 104.9 fL — AB (ref 80–97)
MID (cbc): 0.5 (ref 0–0.9)
MPV: 9.1 fL (ref 0–99.8)
POC GRANULOCYTE: 3.9 (ref 2–6.9)
POC LYMPH PERCENT: 38.3 %L (ref 10–50)
POC MID %: 7.4 % (ref 0–12)
Platelet Count, POC: 311 10*3/uL (ref 142–424)
RBC: 4.11 M/uL (ref 4.04–5.48)
RDW, POC: 13.2 %
WBC: 7.1 10*3/uL (ref 4.6–10.2)

## 2013-08-11 LAB — POCT GLYCOSYLATED HEMOGLOBIN (HGB A1C): HEMOGLOBIN A1C: 5.5

## 2013-08-11 MED ORDER — ZOLPIDEM TARTRATE 5 MG PO TABS: 5.0000 mg | ORAL_TABLET | Freq: Every evening | ORAL | Status: DC | PRN

## 2013-08-11 NOTE — Patient Instructions (Signed)
Referral is being made to a cardiologist, Dr. Viann Fish , for followup post cardiac arrest  Suggest going to the breast Center for a mammogram  Insomnia Insomnia is frequent trouble falling and/or staying asleep. Insomnia can be a long term problem or a short term problem. Both are common. Insomnia can be a short term problem when the wakefulness is related to a certain stress or worry. Long term insomnia is often related to ongoing stress during waking hours and/or poor sleeping habits. Overtime, sleep deprivation itself can make the problem worse. Every little thing feels more severe because you are overtired and your ability to cope is decreased. CAUSES   Stress, anxiety, and depression.  Poor sleeping habits.  Distractions such as TV in the bedroom.  Naps close to bedtime.  Engaging in emotionally charged conversations before bed.  Technical reading before sleep.  Alcohol and other sedatives. They may make the problem worse. They can hurt normal sleep patterns and normal dream activity.  Stimulants such as caffeine for several hours prior to bedtime.  Pain syndromes and shortness of breath can cause insomnia.  Exercise late at night.  Changing time zones may cause sleeping problems (jet lag). It is sometimes helpful to have someone observe your sleeping patterns. They should look for periods of not breathing during the night (sleep apnea). They should also look to see how long those periods last. If you live alone or observers are uncertain, you can also be observed at a sleep clinic where your sleep patterns will be professionally monitored. Sleep apnea requires a checkup and treatment. Give your caregivers your medical history. Give your caregivers observations your family has made about your sleep.  SYMPTOMS   Not feeling rested in the morning.  Anxiety and restlessness at bedtime.  Difficulty falling and staying asleep. TREATMENT   Your caregiver may prescribe  treatment for an underlying medical disorders. Your caregiver can give advice or help if you are using alcohol or other drugs for self-medication. Treatment of underlying problems will usually eliminate insomnia problems.  Medications can be prescribed for short time use. They are generally not recommended for lengthy use.  Over-the-counter sleep medicines are not recommended for lengthy use. They can be habit forming.  You can promote easier sleeping by making lifestyle changes such as:  Using relaxation techniques that help with breathing and reduce muscle tension.  Exercising earlier in the day.  Changing your diet and the time of your last meal. No night time snacks.  Establish a regular time to go to bed.  Counseling can help with stressful problems and worry.  Soothing music and white noise may be helpful if there are background noises you cannot remove.  Stop tedious detailed work at least one hour before bedtime. HOME CARE INSTRUCTIONS   Keep a diary. Inform your caregiver about your progress. This includes any medication side effects. See your caregiver regularly. Take note of:  Times when you are asleep.  Times when you are awake during the night.  The quality of your sleep.  How you feel the next day. This information will help your caregiver care for you.  Get out of bed if you are still awake after 15 minutes. Read or do some quiet activity. Keep the lights down. Wait until you feel sleepy and go back to bed.  Keep regular sleeping and waking hours. Avoid naps.  Exercise regularly.  Avoid distractions at bedtime. Distractions include watching television or engaging in any intense or detailed activity  like attempting to balance the household checkbook.  Develop a bedtime ritual. Keep a familiar routine of bathing, brushing your teeth, climbing into bed at the same time each night, listening to soothing music. Routines increase the success of falling to sleep  faster.  Use relaxation techniques. This can be using breathing and muscle tension release routines. It can also include visualizing peaceful scenes. You can also help control troubling or intruding thoughts by keeping your mind occupied with boring or repetitive thoughts like the old concept of counting sheep. You can make it more creative like imagining planting one beautiful flower after another in your backyard garden.  During your day, work to eliminate stress. When this is not possible use some of the previous suggestions to help reduce the anxiety that accompanies stressful situations. MAKE SURE YOU:   Understand these instructions.  Will watch your condition.  Will get help right away if you are not doing well or get worse. Document Released: 05/04/2000 Document Revised: 07/30/2011 Document Reviewed: 06/04/2007 [facility]Exit[facility] Patient Information 2014 [place], [place].

## 2013-08-11 NOTE — Progress Notes (Signed)
Physical Exam  63 year old lady who's here for physical examination. She has no major medical complaints. 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 She was given CPR on the spot at home and taken to the hospital. She was treated with hypothermia for a couple of days. She had complete recovery with good brain spurring. She never went back to see a doctor after that. Apparently cardiac workup was fairly unremarkable. Surgeries: None Allergies: None Medications: None Last GYN exam: 10 years  Family history: Both parents are deceased of natural causes and pneumonia.  Social history: She is divorced. Has one grown child who is living with her. She is retired from working in [employer]. she was an Environmental health practitioner. She says she does well except having too much energy to go to sleep at night. She does a lot of activity she attends a church. She has not sexually involved. No longer uses drugs  Review of systems: Constitutional: Unremarkable HEENT: Unremarkable Cardiovascular: Unremarkable Respiratory: Unremarkable Gastrointestinal: Unremarkable Genitourinary: Unremarkable Neurologic: Unremarkable Psychiatric: Unremarkable Dermatologic: Unremarkable Endocrinologic: Unremarkable Hematologic: Unremarkable   Physical exam Overweight lady in no major acute distress. TMs are normal. Eyes PERRLA. Fundi benign. Throat clear. Teeth good. Neck supple without nodes or thyromegaly. No carotid bruits. Chest clear to auscultation. Heart regular without murmurs gallops or arrhythmias. Breasts full. No axillary nodes. No breast masses. Abdomen soft without mass or tenderness. Pelvic not done. Extremities unremarkable. Skin unremarkable.  Assessment: Physical examination Obesity History of cardiac arrest  Plan: Labs  and EKG  Results for orders placed in visit on 08/11/13  POCT CBC      Result Value Ref Range   WBC 7.1  4.6 - 10.2 K/uL   Lymph, poc 2.7  0.6 - 3.4   POC LYMPH PERCENT 38.3  10 - 50 %L   MID (cbc) 0.5  0 - 0.9   POC MID % 7.4  0 - 12 %M   POC Granulocyte 3.9  2 - 6.9   Granulocyte percent 54.3  37 - 80 %G   RBC 4.11  4.04 - 5.48 M/uL   Hemoglobin 13.6  12.2 - 16.2 g/dL   HCT, POC 35.7  01.7 - 47.9 %   MCV 104.9 (*) 80 - 97 fL   MCH, POC 33.1 (*) 27 - 31.2 pg   MCHC 31.6 (*) 31.8 - 35.4 g/dL   RDW, POC 79.3     Platelet Count, POC 311  142 - 424 K/uL   MPV 9.1  0 - 99.8 fL  POCT GLYCOSYLATED HEMOGLOBIN (HGB A1C)      Result Value Ref Range   Hemoglobin A1C 5.5      Will make cardiology referral  See the [facility] for mammogram  Return in one year. She said she wanted to see me, and I said I would happy to do so on a as available basis as a walk-in here at 104

## 2013-08-12 LAB — LIPID PANEL
CHOLESTEROL: 257 mg/dL — AB (ref 0–200)
HDL: 57 mg/dL (ref >39)
LDL Cholesterol: 170 mg/dL — ABNORMAL HIGH (ref 0–99)
Total CHOL/HDL Ratio: 4.5 Ratio
Triglycerides: 152 mg/dL — ABNORMAL HIGH (ref <150)
VLDL: 30 mg/dL (ref 0–40)

## 2013-08-12 LAB — COMPREHENSIVE METABOLIC PANEL
ALBUMIN: 4.4 g/dL (ref 3.5–5.2)
ALT: 23 U/L (ref 0–35)
AST: 22 U/L (ref 0–37)
Alkaline Phosphatase: 71 U/L (ref 39–117)
BUN: 13 mg/dL (ref 6–23)
CALCIUM: 9.8 mg/dL (ref 8.4–10.5)
CHLORIDE: 105 meq/L (ref 96–112)
CO2: 26 meq/L (ref 19–32)
Creat: 0.96 mg/dL (ref 0.50–1.10)
Glucose, Bld: 93 mg/dL (ref 70–99)
POTASSIUM: 4.6 meq/L (ref 3.5–5.3)
Sodium: 140 mEq/L (ref 135–145)
TOTAL PROTEIN: 7.7 g/dL (ref 6.0–8.3)
Total Bilirubin: 0.7 mg/dL (ref 0.2–1.2)

## 2013-08-13 ENCOUNTER — Ambulatory Visit (INDEPENDENT_AMBULATORY_CARE_PROVIDER_SITE_OTHER): Payer: BC Managed Care – PPO | Admitting: *Deleted

## 2013-08-13 ENCOUNTER — Other Ambulatory Visit: Payer: Self-pay | Admitting: *Deleted

## 2013-08-13 ENCOUNTER — Telehealth: Payer: Self-pay

## 2013-08-13 DIAGNOSIS — E876 Hypokalemia: Secondary | ICD-10-CM

## 2013-08-13 DIAGNOSIS — Z0000 Encounter for general adult medical examination without abnormal findings: Secondary | ICD-10-CM

## 2013-08-13 DIAGNOSIS — Z111 Encounter for screening for respiratory tuberculosis: Secondary | ICD-10-CM

## 2013-08-13 LAB — TB SKIN TEST
INDURATION: 0 mm
TB SKIN TEST: NEGATIVE

## 2013-08-13 MED ORDER — POTASSIUM CHLORIDE CRYS ER 20 MEQ PO TBCR: 20.0000 meq | EXTENDED_RELEASE_TABLET | Freq: Every day | ORAL | Status: DC

## 2013-08-13 NOTE — Telephone Encounter (Signed)
PA needed for zolpidem. Completed on phone and received approval through 08/13/14. Notified pharm and pt.

## 2013-08-14 ENCOUNTER — Telehealth: Payer: Self-pay

## 2013-08-14 NOTE — Telephone Encounter (Signed)
Dr York Spaniel office called to inform us that pt did not show up for her appt today that Dr Alwyn Ren had referred her to. Dr Alwyn Ren and Referrals, Lorain Childes

## 2013-08-16 NOTE — Telephone Encounter (Signed)
Call patient: She did not show for her cardiology appointment. If she desires further treatment she needs to return here to rediscuss.

## 2013-08-17 NOTE — Telephone Encounter (Signed)
FYI Pt forgot about appt. Gave her Dr. Donnie Aho office number she is going to call to reschedule.

## 2013-08-27 ENCOUNTER — Telehealth: Payer: Self-pay

## 2013-08-27 NOTE — Telephone Encounter (Signed)
Patient was seen for CPE on 08/13/2013. 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 Please fax to Haroldine Laws at [phone]  Call back number-[phone]

## 2013-08-28 ENCOUNTER — Encounter: Payer: Self-pay | Admitting: Family Medicine

## 2013-09-01 NOTE — Telephone Encounter (Signed)
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 Will not sent office notes due to letter already being sent.

## 2013-09-08 ENCOUNTER — Telehealth: Payer: Self-pay

## 2013-09-08 NOTE — Telephone Encounter (Signed)
PA was approved for zolpidem for quantity over allowed amount through 08/13/14. Faxed to pharm.

## 2013-11-13 ENCOUNTER — Telehealth: Payer: Self-pay

## 2013-11-13 DIAGNOSIS — Z0000 Encounter for general adult medical examination without abnormal findings: Secondary | ICD-10-CM

## 2013-11-13 DIAGNOSIS — G4700 Insomnia, unspecified: Secondary | ICD-10-CM

## 2013-11-13 NOTE — Telephone Encounter (Signed)
Pt is calling for a refill on zolpidem Please call pt to advise

## 2013-11-14 MED ORDER — ZOLPIDEM TARTRATE 5 MG PO TABS: 5.0000 mg | ORAL_TABLET | Freq: Every evening | ORAL | Status: DC | PRN

## 2013-11-14 NOTE — Telephone Encounter (Signed)
2 refills okay.  I am not there for next week to sign it, so ask PA to do so please. Thanks, Janace Hoard MD

## 2013-11-14 NOTE — Telephone Encounter (Signed)
Rx printed and signed.  

## 2013-11-14 NOTE — Telephone Encounter (Signed)
Rx faxed and pt notified

## 2013-12-21 ENCOUNTER — Other Ambulatory Visit: Payer: Self-pay | Admitting: Family Medicine

## 2013-12-21 NOTE — Telephone Encounter (Signed)
Pt called wanting to get a refill on zolpidem (AMBIEN) 5 MG tablet [[id number]]   [facility] [address]  Pt # [phone]

## 2013-12-23 NOTE — Telephone Encounter (Signed)
Faxed Rx and pt agreed to f/up before more needed.

## 2013-12-23 NOTE — Telephone Encounter (Signed)
Give him one refill, but needs to be rechecked before future prescriptions.

## 2014-04-23 ENCOUNTER — Other Ambulatory Visit: Payer: Self-pay | Admitting: Family Medicine

## 2016-06-29 ENCOUNTER — Ambulatory Visit (INDEPENDENT_AMBULATORY_CARE_PROVIDER_SITE_OTHER): Payer: BC Managed Care – PPO

## 2016-06-30 ENCOUNTER — Ambulatory Visit (INDEPENDENT_AMBULATORY_CARE_PROVIDER_SITE_OTHER): Payer: BC Managed Care – PPO | Admitting: Family Medicine

## 2016-06-30 VITALS — BP 164/96 | HR 87 | Temp 97.9°F | Resp 18 | Ht 66.0 in | Wt 170.0 lb

## 2016-06-30 DIAGNOSIS — I10 Essential (primary) hypertension: Secondary | ICD-10-CM | POA: Diagnosis not present

## 2016-06-30 DIAGNOSIS — E782 Mixed hyperlipidemia: Secondary | ICD-10-CM | POA: Diagnosis not present

## 2016-06-30 DIAGNOSIS — Z111 Encounter for screening for respiratory tuberculosis: Secondary | ICD-10-CM

## 2016-06-30 DIAGNOSIS — G4700 Insomnia, unspecified: Secondary | ICD-10-CM

## 2016-06-30 DIAGNOSIS — I25810 Atherosclerosis of coronary artery bypass graft(s) without angina pectoris: Secondary | ICD-10-CM | POA: Diagnosis not present

## 2016-06-30 MED ORDER — ZOLPIDEM TARTRATE 5 MG PO TABS: 2.5000 mg | ORAL_TABLET | Freq: Every evening | ORAL | 0 refills | Status: DC | PRN

## 2016-06-30 MED ORDER — HYDROCHLOROTHIAZIDE 25 MG PO TABS: 25.0000 mg | ORAL_TABLET | Freq: Every day | ORAL | 3 refills | Status: DC

## 2016-06-30 MED ORDER — METOPROLOL SUCCINATE ER 25 MG PO TB24: 25.0000 mg | ORAL_TABLET | Freq: Every day | ORAL | 3 refills | Status: DC

## 2016-06-30 NOTE — Progress Notes (Signed)
Chief Complaint  Patient presents with  . PPD Placement  . Medication Refill    Ambien    HPI Insomnia She reports that she has been doing better with her rest especially now that she is staying busy She has not needed ambien in over year She reports that she was taking a half tablet of ambien 5mg   CAD with history of MI in 2013 CAD- she reports that she had a heart attack 5 years ago She does not take daily bp medications  She takes a daily aspirin BP Readings from Last 3 Encounters:  06/30/16 (!) 164/96  08/11/13 (!) 153/76  06/25/12 168/98    Dyslipidemia: Patient presents for evaluation of lipids.  Compliance with treatment thus far has been inadequate.  A repeat fasting lipid profile was done.  The patient does not use medications that may worsen dyslipidemias (corticosteroids, progestins, anabolic steroids, diuretics, beta-blockers, amiodarone, cyclosporine, olanzapine). The patient exercises daily.  The patient is known to have coexisting coronary artery disease.    Lab Results  Component Value Date   CHOL 257 (H) 08/11/2013   Lab Results  Component Value Date   HDL 57 08/11/2013   Lab Results  Component Value Date   LDLCALC 170 (H) 08/11/2013   Lab Results  Component Value Date   TRIG 152 (H) 08/11/2013   TRIG 197 (H) 09/15/2011   Lab Results  Component Value Date   CHOLHDL 4.5 08/11/2013   No results found for: LDLDIRECT  Hypertension Hypertension: Patient here for follow-up of elevated blood pressure. She is exercising and is adherent to low salt diet.  Blood pressure is not not checked at home. Cardiac symptoms none. Patient denies chest pain, exertional chest pressure/discomfort, irregular heart beat, palpitations and paroxysmal nocturnal dyspnea.  Cardiovascular risk factors: dyslipidemia and hypertension. Use of agents associated with hypertension: none. History of target organ damage: angina/ prior myocardial infarction. BP Readings from Last 3  Encounters:  06/30/16 (!) 164/96  08/11/13 (!) 153/76  06/25/12 168/98     Past Medical History:  Diagnosis Date  . Acute MI   . Cardiomyopathy, severe by echo with EF 10-20% 09/11/2011  . MR (mitral regurgitation), moderate 09/11/2011    Current Outpatient Prescriptions  Medication Sig Dispense Refill  . aspirin 81 MG tablet Take 81 mg by mouth daily.    Marland Kitchen BIOTIN PO Take 1 tablet by mouth daily.    Marland Kitchen zolpidem (AMBIEN) 5 MG tablet Take 0.5 tablets (2.5 mg total) by mouth at bedtime as needed. for sleep 30 tablet 0  . hydrochlorothiazide (HYDRODIURIL) 25 MG tablet Take 1 tablet (25 mg total) by mouth daily. 30 tablet 3  . metoprolol succinate (TOPROL XL) 25 MG 24 hr tablet Take 1 tablet (25 mg total) by mouth daily. 30 tablet 3   No current facility-administered medications for this visit.     Allergies: No Known Allergies  No past surgical history on file.  Social History   Social History  . Marital status: Divorced    Spouse name: N/A  . Number of children: N/A  . Years of education: N/A   Social History Main Topics  . Smoking status: Current Some Day Smoker    Packs/day: 0.25    Years: 15.00    Types: Cigarettes  . Smokeless tobacco: Never Used  . Alcohol use 4.5 oz/week    9 Standard drinks or equivalent per week  . Drug use: Yes    Frequency: 1.0 time per week  Types: Cocaine  . Sexual activity: Not Currently    Birth control/ protection: Post-menopausal   Other Topics Concern  . None   Social History Narrative  . None    Review of Systems  Constitutional: Negative for chills, fever and weight loss.  HENT: Negative for ear discharge, ear pain and tinnitus.   Eyes: Negative for blurred vision and double vision.  Respiratory: Negative for cough, hemoptysis, sputum production, shortness of breath and wheezing.   Cardiovascular: Negative for chest pain, palpitations, claudication and leg swelling.  Gastrointestinal: Negative for abdominal pain,  diarrhea, nausea and vomiting.  Skin: Negative for itching and rash.  Neurological: Negative for dizziness and headaches.    Objective: Vitals:   06/30/16 0953  BP: (!) 164/96  Pulse: 87  Resp: 18  Temp: 97.9 F (36.6 C)  TempSrc: Oral  SpO2: 99%  Weight: 170 lb (77.1 kg)  Height: 5\' 6"  (1.676 m)    Physical Exam  Constitutional: She is oriented to person, place, and time. She appears well-developed and well-nourished.  HENT:  Head: Normocephalic and atraumatic.  Nose: Nose normal.  Mouth/Throat: Oropharynx is clear and moist. No oropharyngeal exudate.  Eyes: Conjunctivae and EOM are normal.  Cardiovascular: Normal rate, regular rhythm and normal heart sounds.   No murmur heard. Pulmonary/Chest: Effort normal and breath sounds normal. No respiratory distress. She has no wheezes.  Abdominal: Soft. Bowel sounds are normal. She exhibits no distension and no mass. There is no tenderness. There is no rebound and no guarding. No hernia.  Neurological: She is alert and oriented to person, place, and time.  Skin: Skin is warm. Capillary refill takes less than 2 seconds. No erythema.  Psychiatric: She has a normal mood and affect. Her behavior is normal. Judgment and thought content normal.    Assessment and Plan Ayriel was seen today for ppd placement and medication refill.  Diagnoses and all orders for this visit:  Insomnia- refilled Remus Loffler but talked about other strategies. Sounds like she is using much  Less ambien so refilled today Screening-pulmonary TB -     TB Skin Test   Mixed dyslipidemia- reviewed lipid panel from 2015  Advised pt to repeat panel today Should be on a statin based on history of MI and previous elevated lipid panel -     Lipid panel -     Comprehensive metabolic panel -     TSH  Coronary artery disease involving coronary bypass graft of native heart without angina pectoris- discussed that she should continue exercise  -     Lipid  panel  Uncontrolled hypertension- discussed that given her cocaine induced MI she should try beta blocker and diuretic for her hypertension -     Comprehensive metabolic panel -     metoprolol succinate (TOPROL XL) 25 MG 24 hr tablet; Take 1 tablet (25 mg total) by mouth daily. -     hydrochlorothiazide (HYDRODIURIL) 25 MG tablet; Take 1 tablet (25 mg total) by mouth daily.   A total of 40 minutes were spent face-to-face with the patient during this encounter and over half of that time was spent on counseling and coordination of care.   Adri Schloss A Zafirah Vanzee

## 2016-06-30 NOTE — Progress Notes (Signed)
  Tuberculosis Risk Questionnaire  1. No Were you born outside the [place] in one of the following parts of the world: [place], [place], [place], [place] or [place]?    2. No Have you traveled outside the [place] and lived for more than one month in one of the following parts of the world: [place], [place], [place], [place] or [place]?    3. No Do you have a compromised immune system such as from any of the following conditions:HIV/AIDS, organ or bone marrow transplantation, diabetes, immunosuppressive medicines (e.g. Prednisone, Remicaide), leukemia, lymphoma, cancer of the head or neck, gastrectomy or jejunal bypass, end-stage renal disease (on dialysis), or silicosis?     4. Yes Daycare Have you ever or do you plan on working in: a residential care center, a health care facility, a jail or prison or homeless shelter?    5. Yes Have you ever: injected illegal drugs, used crack cocaine, lived in a homeless shelter  or been in jail or prison?     6. No Have you ever been exposed to anyone with infectious tuberculosis?    Tuberculosis Symptom Questionnaire  Do you currently have any of the following symptoms?  1. No Unexplained cough lasting more than 3 weeks?   2. No Unexplained fever lasting more than 3 weeks.   3. No Night Sweats (sweating that leaves the bedclothes and sheets wet)     4. No Shortness of Breath   5. No Chest Pain   6. No Unintentional weight loss    7. No Unexplained fatigue (very tired for no reason)

## 2016-06-30 NOTE — Progress Notes (Signed)

## 2016-06-30 NOTE — Patient Instructions (Addendum)
   IF you received an x-ray today, you will receive an invoice from [facility]. Please contact [facility] at [phone] with questions or concerns regarding your invoice.   IF you received labwork today, you will receive an invoice from [facility]. Please contact [facility] at [phone] with questions or concerns regarding your invoice.   Our billing staff will not be able to assist you with questions regarding bills from these companies.  You will be contacted with the lab results as soon as they are available. The fastest way to get your results is to activate your My Chart account. Instructions are located on the last page of this paperwork. If you have not heard from us regarding the results in 2 weeks, please contact this office.      Managing Your Hypertension Hypertension is commonly called high blood pressure. Blood pressure is a measurement of how strongly your blood is pressing against the walls of your arteries. Arteries are blood vessels that carry blood from your heart throughout your body. Blood pressure does not stay the same. It rises when you are active, excited, or nervous. It lowers when you are sleeping or relaxed. If the numbers that measure your blood pressure stay above normal most of the time, you are at risk for health problems. Hypertension is a long-term (chronic) condition in which blood pressure is elevated. This condition often has no signs or symptoms. The cause of the condition is usually not known. What are blood pressure readings? A blood pressure reading is recorded as two numbers, such as "120 over 80" (or 120/80). The first ("top") number is called the systolic pressure. It is a measure of the pressure in your arteries as the heart beats. The second ("bottom") number is called the diastolic pressure. It is a measure of the pressure in your arteries as the heart relaxes between beats. What does my blood pressure reading mean? Blood  pressure is classified into four stages. Based on your blood pressure reading, your health care provider may use the following stages to determine what type of treatment, if any, is needed. Systolic pressure and diastolic pressure are measured in a unit called mm Hg. Normal  Systolic pressure: below 120.  Diastolic pressure: below 80. Prehypertension  Systolic pressure: 120-139.  Diastolic pressure: 80-89. Hypertension stage 1  Systolic pressure: 140-159.  Diastolic pressure: 90-99. Hypertension stage 2  Systolic pressure: 160 or above.  Diastolic pressure: 100 or above. What health risks are associated with hypertension? Managing your hypertension is an important responsibility. Uncontrolled hypertension can lead to:  A heart attack.  A stroke.  A weakened blood vessel (aneurysm).  Heart failure.  Kidney damage.  Eye damage.  Metabolic syndrome.  Memory and concentration problems. What changes can I make to manage my hypertension? Hypertension can be managed effectively by making lifestyle changes and possibly by taking medicines. Your health care provider will help you come up with a plan to bring your blood pressure within a normal range. Your plan should include the following: Monitoring  Monitor your blood pressure at home as told by your health care provider. Your personal target blood pressure may vary depending on your medical conditions, your age, and other factors.  Have your blood pressure rechecked as told by your health care provider. Lifestyle  Lose weight if necessary.  Get at least 30-45 minutes of aerobic exercise at least 4 times a week.  Do not use any products that contain nicotine or tobacco, such as cigarettes and   e-cigarettes. If you need help quitting, ask your health care provider.  Learn ways to reduce stress.  Control any chronic conditions, such as high cholesterol or diabetes. Eating and drinking  Follow the DASH diet. This diet  is high in fruits, vegetables, and whole grains. It is low in salt, red meat, and added sugars.  Keep your sodium intake below 2,300 mg per day.  Limit alcoholic beverages. Communication  Review all the medicines you take with your health care provider because there may be side effects or interactions.  Talk with your health care provider about your diet, exercise habits, and other lifestyle factors that may be contributing to hypertension.  See your health care provider regularly. Your health care provider can help you create and adjust your plan for managing hypertension. Will I need medicine to control my blood pressure? Your health care provider may prescribe medicine if lifestyle changes are not enough to get your blood pressure under control, and if one of the following is true:  You are 18-59 years of age, and your systolic blood pressure is 140 or higher.  You are 60 years of age or older, and your systolic blood pressure is 150 or higher.  Your diastolic blood pressure is 90 or higher.  You have diabetes, and your systolic blood pressure is over 140 or your diastolic blood pressure is over 90.  You have kidney disease, and your blood pressure is above 140/90.  You have heart disease or a history of stroke, and your blood pressure is 140/90 or higher. Take medicines only as told by your health care provider. Follow the directions carefully. Blood pressure medicines must be taken as prescribed. The medicine does not work as well when you skip doses. Skipping doses also puts you at risk for problems. Contact a health care provider if:  You think you are having a reaction to medicines you have taken.  You have repeated (recurrent) headaches.  You feel dizzy.  You have swelling in your ankles.  You have trouble with your vision. Get help right away if:  You develop a severe headache or confusion.  You have unusual weakness or numbness, or you feel faint.  You have  severe pain in your chest or abdomen.  You vomit repeatedly.  You have trouble breathing. This information is not intended to replace advice given to you by your health care provider. Make sure you discuss any questions you have with your health care provider. Document Released: 01/30/2012 Document Revised: 01/10/2016 Document Reviewed: 08/05/2015 Elsevier Interactive Patient Education  2017 [employer]  

## 2016-07-01 LAB — COMPREHENSIVE METABOLIC PANEL
ALT: 87 IU/L — AB (ref 0–32)
AST: 127 IU/L — AB (ref 0–40)
Albumin/Globulin Ratio: 1.2 (ref 1.2–2.2)
Albumin: 4.3 g/dL (ref 3.6–4.8)
Alkaline Phosphatase: 85 IU/L (ref 39–117)
BILIRUBIN TOTAL: 0.8 mg/dL (ref 0.0–1.2)
BUN/Creatinine Ratio: 11 — ABNORMAL LOW (ref 12–28)
BUN: 10 mg/dL (ref 8–27)
CALCIUM: 9.5 mg/dL (ref 8.7–10.3)
CHLORIDE: 102 mmol/L (ref 96–106)
CO2: 21 mmol/L (ref 18–29)
Creatinine, Ser: 0.89 mg/dL (ref 0.57–1.00)
GFR calc non Af Amer: 69 mL/min/{1.73_m2} (ref >59)
GFR, EST AFRICAN AMERICAN: 79 mL/min/{1.73_m2} (ref >59)
GLUCOSE: 89 mg/dL (ref 65–99)
Globulin, Total: 3.5 g/dL (ref 1.5–4.5)
POTASSIUM: 4.3 mmol/L (ref 3.5–5.2)
Sodium: 142 mmol/L (ref 134–144)
Total Protein: 7.8 g/dL (ref 6.0–8.5)

## 2016-07-01 LAB — LIPID PANEL
CHOL/HDL RATIO: 4.6 ratio — AB (ref 0.0–4.4)
Cholesterol, Total: 277 mg/dL — ABNORMAL HIGH (ref 100–199)
HDL: 60 mg/dL (ref >39)
LDL Calculated: 194 mg/dL — ABNORMAL HIGH (ref 0–99)
Triglycerides: 114 mg/dL (ref 0–149)
VLDL CHOLESTEROL CAL: 23 mg/dL (ref 5–40)

## 2016-07-01 LAB — TSH: TSH: 0.844 u[IU]/mL (ref 0.450–4.500)

## 2016-07-02 ENCOUNTER — Ambulatory Visit (INDEPENDENT_AMBULATORY_CARE_PROVIDER_SITE_OTHER): Payer: BC Managed Care – PPO | Admitting: Physician Assistant

## 2016-07-02 DIAGNOSIS — Z111 Encounter for screening for respiratory tuberculosis: Secondary | ICD-10-CM | POA: Diagnosis not present

## 2016-07-02 LAB — TB SKIN TEST: TB SKIN TEST: NEGATIVE

## 2016-07-02 NOTE — Progress Notes (Signed)
Negative tb test 30mm induration

## 2016-07-18 ENCOUNTER — Telehealth: Payer: Self-pay | Admitting: Family Medicine

## 2016-07-18 MED ORDER — ATORVASTATIN CALCIUM 20 MG PO TABS: 20.0000 mg | ORAL_TABLET | Freq: Every day | ORAL | 3 refills | Status: DC

## 2016-07-18 NOTE — Telephone Encounter (Signed)
Discussed follow up for uncontrolled hypertension Discussed her labs and emphasized that she had a cardiac arrest and now has uncontrolled hypertension and dyslipidemia with elevated transaminases She will start a fish oil and said she will pick up the lipitor but admits that she is not taking any blood pressure medications because she hates chemicals.  She will follow up in in one month.  She agreed that if her bp was high at the pharmacy she would pick up her bp medications. She will also check her bp with her neighbors bp cuff.

## 2017-01-30 ENCOUNTER — Ambulatory Visit (INDEPENDENT_AMBULATORY_CARE_PROVIDER_SITE_OTHER): Payer: BC Managed Care – PPO | Admitting: Physician Assistant

## 2017-02-13 ENCOUNTER — Encounter (INDEPENDENT_AMBULATORY_CARE_PROVIDER_SITE_OTHER): Payer: Self-pay | Admitting: Physician Assistant

## 2017-02-13 ENCOUNTER — Ambulatory Visit (INDEPENDENT_AMBULATORY_CARE_PROVIDER_SITE_OTHER): Payer: Medicare Other | Admitting: Physician Assistant

## 2017-02-13 VITALS — BP 149/82 | HR 86 | Temp 98.7°F | Wt 159.4 lb

## 2017-02-13 DIAGNOSIS — I10 Essential (primary) hypertension: Secondary | ICD-10-CM | POA: Insufficient documentation

## 2017-02-13 DIAGNOSIS — Z131 Encounter for screening for diabetes mellitus: Secondary | ICD-10-CM

## 2017-02-13 DIAGNOSIS — Z114 Encounter for screening for human immunodeficiency virus [HIV]: Secondary | ICD-10-CM | POA: Diagnosis not present

## 2017-02-13 DIAGNOSIS — Z1159 Encounter for screening for other viral diseases: Secondary | ICD-10-CM | POA: Diagnosis not present

## 2017-02-13 DIAGNOSIS — Z9119 Patient's noncompliance with other medical treatment and regimen: Secondary | ICD-10-CM | POA: Diagnosis not present

## 2017-02-13 DIAGNOSIS — G4700 Insomnia, unspecified: Secondary | ICD-10-CM

## 2017-02-13 DIAGNOSIS — Z91199 Patient's noncompliance with other medical treatment and regimen due to unspecified reason: Secondary | ICD-10-CM

## 2017-02-13 LAB — POCT GLYCOSYLATED HEMOGLOBIN (HGB A1C): Hemoglobin A1C: 5

## 2017-02-13 MED ORDER — ASPIRIN 81 MG PO TABS: 81.0000 mg | ORAL_TABLET | Freq: Every day | ORAL | 3 refills | Status: AC

## 2017-02-13 MED ORDER — ATORVASTATIN CALCIUM 20 MG PO TABS: 20.0000 mg | ORAL_TABLET | Freq: Every day | ORAL | 3 refills | Status: DC

## 2017-02-13 MED ORDER — HYDROCHLOROTHIAZIDE 12.5 MG PO TABS: 12.5000 mg | ORAL_TABLET | Freq: Every day | ORAL | 3 refills | Status: DC

## 2017-02-13 MED ORDER — METOPROLOL SUCCINATE ER 25 MG PO TB24: 25.0000 mg | ORAL_TABLET | Freq: Every day | ORAL | 3 refills | Status: DC

## 2017-02-13 MED ORDER — ZOLPIDEM TARTRATE 5 MG PO TABS: 2.5000 mg | ORAL_TABLET | Freq: Every evening | ORAL | 0 refills | Status: DC | PRN

## 2017-02-13 NOTE — Progress Notes (Signed)
Subjective:  Patient ID: Dawn Cruz, female    DOB: 01-May-1951  Age: 66 y.o. MRN: [id number]  CC: establish care  HPI Dawn Cruz is a 66 y.o. female with a medical history of MI, cardiomyopathy, mitral regurgitation, HTN, s/p CABG, cocaine use, and insomnia presents as a new patient for management of her HTN and insomnia. Begins by saying she does not like taking pills. Was already prescribed HTN and insomnia medication which she does not take. Likes to drink Vodka and reports drinking two mixed drinks of vodka per day. Uses vodka to help her go to sleep. Does not have any symptoms or complaints. Says she will think about taking medications if I advise her to.       Outpatient Medications Prior to Visit  Medication Sig Dispense Refill  . aspirin 81 MG tablet Take 81 mg by mouth daily.    Marland Kitchen atorvastatin (LIPITOR) 20 MG tablet Take 1 tablet (20 mg total) by mouth daily. (Patient not taking: Reported on 02/13/2017) 90 tablet 3  . BIOTIN PO Take 1 tablet by mouth daily.    . hydrochlorothiazide (HYDRODIURIL) 25 MG tablet Take 1 tablet (25 mg total) by mouth daily. (Patient not taking: Reported on 02/13/2017) 30 tablet 3  . metoprolol succinate (TOPROL XL) 25 MG 24 hr tablet Take 1 tablet (25 mg total) by mouth daily. (Patient not taking: Reported on 02/13/2017) 30 tablet 3  . zolpidem (AMBIEN) 5 MG tablet Take 0.5 tablets (2.5 mg total) by mouth at bedtime as needed. for sleep (Patient not taking: Reported on 02/13/2017) 30 tablet 0   No facility-administered medications prior to visit.      ROS Review of Systems  Constitutional: Negative for chills, fever and malaise/fatigue.  Eyes: Negative for blurred vision.  Respiratory: Negative for shortness of breath.   Cardiovascular: Negative for chest pain and palpitations.  Gastrointestinal: Negative for abdominal pain and nausea.  Genitourinary: Negative for dysuria and hematuria.  Musculoskeletal: Negative for joint pain and  myalgias.  Skin: Negative for rash.  Neurological: Negative for tingling and headaches.  Psychiatric/Behavioral: Negative for depression. The patient is not nervous/anxious.     Objective:  BP (!) 149/82 (BP Location: Left Arm, Patient Position: Sitting, Cuff Size: Normal)   Pulse 86   Temp 98.7 F (37.1 C) (Oral)   Wt 159 lb 6.4 oz (72.3 kg)   SpO2 98%   BMI 25.73 kg/m   BP/Weight 02/13/2017 06/30/2016 08/11/2013  Systolic BP 149 164 153  Diastolic BP 82 96 76  Wt. (Lbs) 159.4 170 180.6  BMI 25.73 27.44 29.16      Physical Exam  Constitutional: She is oriented to person, place, and time.  Well developed, appears younger than stated age, NAD, boisterous   HENT:  Head: Normocephalic and atraumatic.  Eyes: Conjunctivae are normal. No scleral icterus.  Neck: Normal range of motion. Neck supple. No thyromegaly present.  Cardiovascular: Normal rate, regular rhythm and normal heart sounds.   No murmur heard. Pulmonary/Chest: Effort normal. No respiratory distress. She has no wheezes. She has no rales.  Abdominal: Soft. Bowel sounds are normal. There is no tenderness.  Musculoskeletal: She exhibits no edema.  Neurological: She is alert and oriented to person, place, and time. No cranial nerve deficit. Coordination normal.  Skin: Skin is warm and dry. No rash noted. No erythema. No pallor.  Psychiatric: She has a normal mood and affect. Her behavior is normal. Thought content normal.  Vitals reviewed.  Assessment & Plan:     1. Screening for diabetes mellitus - HgB A1c 5.0% in clinic today.  2. Insomnia, unspecified type - Begin zolpidem (AMBIEN) 5 MG tablet; Take 0.5 tablets (2.5 mg total) by mouth at bedtime as needed. for sleep  Dispense: 30 tablet; Refill: 0  3. Hypertension, unspecified type - Begin atorvastatin (LIPITOR) 20 MG tablet; Take 1 tablet (20 mg total) by mouth daily.  Dispense: 90 tablet; Refill: 3 - Begin metoprolol succinate (TOPROL XL) 25 MG 24 hr  tablet; Take 1 tablet (25 mg total) by mouth daily.  Dispense: 90 tablet; Refill: 3 - Begin aspirin 81 MG tablet; Take 1 tablet (81 mg total) by mouth daily.  Dispense: 90 tablet; Refill: 3 - Begin hydrochlorothiazide (HYDRODIURIL) 12.5 MG tablet; Take 1 tablet (12.5 mg total) by mouth daily.  Dispense: 90 tablet; Refill: 3 - Comprehensive metabolic panel - CBC with Differential  4. Need for hepatitis C screening test - Hepatitis c antibody (reflex)  5. Encounter for screening for HIV - HIV antibody  6. Noncompliance   Meds ordered this encounter  Medications  . zolpidem (AMBIEN) 5 MG tablet    Sig: Take 0.5 tablets (2.5 mg total) by mouth at bedtime as needed. for sleep    Dispense:  30 tablet    Refill:  0    Not to exceed 4 additional fills before 02/07/2014.    Order Specific Question:   Supervising Provider    Answer:   Quentin Angst [id number]  . atorvastatin (LIPITOR) 20 MG tablet    Sig: Take 1 tablet (20 mg total) by mouth daily.    Dispense:  90 tablet    Refill:  3    Order Specific Question:   Supervising Provider    Answer:   Quentin Angst [id number]  . metoprolol succinate (TOPROL XL) 25 MG 24 hr tablet    Sig: Take 1 tablet (25 mg total) by mouth daily.    Dispense:  90 tablet    Refill:  3    Order Specific Question:   Supervising Provider    Answer:   Quentin Angst [id number]  . aspirin 81 MG tablet    Sig: Take 1 tablet (81 mg total) by mouth daily.    Dispense:  90 tablet    Refill:  3    Order Specific Question:   Supervising Provider    Answer:   Quentin Angst [id number]  . hydrochlorothiazide (HYDRODIURIL) 12.5 MG tablet    Sig: Take 1 tablet (12.5 mg total) by mouth daily.    Dispense:  90 tablet    Refill:  3    Order Specific Question:   Supervising Provider    Answer:   Quentin Angst [[id number]]    Follow-up: Return in about 4 weeks (around 03/13/2017) for full physical.   Loletta Specter PA

## 2017-02-13 NOTE — Patient Instructions (Signed)

## 2017-02-14 ENCOUNTER — Telehealth (INDEPENDENT_AMBULATORY_CARE_PROVIDER_SITE_OTHER): Payer: Self-pay

## 2017-02-14 LAB — CBC WITH DIFFERENTIAL/PLATELET
Basophils Absolute: 0 10*3/uL (ref 0.0–0.2)
Basos: 1 %
EOS (ABSOLUTE): 0.1 10*3/uL (ref 0.0–0.4)
Eos: 2 %
HEMOGLOBIN: 13.7 g/dL (ref 11.1–15.9)
Hematocrit: 39.6 % (ref 34.0–46.6)
IMMATURE GRANS (ABS): 0 10*3/uL (ref 0.0–0.1)
IMMATURE GRANULOCYTES: 0 %
LYMPHS ABS: 3.1 10*3/uL (ref 0.7–3.1)
Lymphs: 51 %
MCH: 35.8 pg — ABNORMAL HIGH (ref 26.6–33.0)
MCHC: 34.6 g/dL (ref 31.5–35.7)
MCV: 103 fL — AB (ref 79–97)
MONOS ABS: 0.4 10*3/uL (ref 0.1–0.9)
Monocytes: 6 %
NEUTROS ABS: 2.4 10*3/uL (ref 1.4–7.0)
Neutrophils: 40 %
Platelets: 286 10*3/uL (ref 150–379)
RBC: 3.83 x10E6/uL (ref 3.77–5.28)
RDW: 13.1 % (ref 12.3–15.4)
WBC: 6.1 10*3/uL (ref 3.4–10.8)

## 2017-02-14 LAB — COMPREHENSIVE METABOLIC PANEL
A/G RATIO: 1.3 (ref 1.2–2.2)
ALBUMIN: 4.6 g/dL (ref 3.6–4.8)
ALK PHOS: 87 IU/L (ref 39–117)
ALT: 41 IU/L — ABNORMAL HIGH (ref 0–32)
AST: 50 IU/L — ABNORMAL HIGH (ref 0–40)
BILIRUBIN TOTAL: 0.7 mg/dL (ref 0.0–1.2)
BUN / CREAT RATIO: 12 (ref 12–28)
BUN: 12 mg/dL (ref 8–27)
CHLORIDE: 100 mmol/L (ref 96–106)
CO2: 26 mmol/L (ref 20–29)
Calcium: 10.2 mg/dL (ref 8.7–10.3)
Creatinine, Ser: 1 mg/dL (ref 0.57–1.00)
GFR calc non Af Amer: 59 mL/min/{1.73_m2} — ABNORMAL LOW (ref >59)
GFR, EST AFRICAN AMERICAN: 68 mL/min/{1.73_m2} (ref >59)
GLOBULIN, TOTAL: 3.6 g/dL (ref 1.5–4.5)
Glucose: 98 mg/dL (ref 65–99)
Potassium: 4.7 mmol/L (ref 3.5–5.2)
SODIUM: 142 mmol/L (ref 134–144)
Total Protein: 8.2 g/dL (ref 6.0–8.5)

## 2017-02-14 LAB — HEPATITIS C ANTIBODY (REFLEX): HCV Ab: <0.1 s/co ratio (ref 0.0–0.9)

## 2017-02-14 LAB — HIV ANTIBODY (ROUTINE TESTING W REFLEX): HIV SCREEN 4TH GENERATION: NONREACTIVE

## 2017-02-14 LAB — HCV COMMENT:

## 2017-02-14 NOTE — Telephone Encounter (Signed)
Patient has been provided with Negative HIV and Hep C results and elevated liver enzyme results and instructed to cut back on drinking. Maryjean Morn, CMA

## 2017-02-14 NOTE — Telephone Encounter (Signed)
-----   Message from Loletta Specter, PA-C sent at 02/14/2017  8:35 AM EDT ----- HIV and Hep C negative. Liver enzymes are elevated but better than previous. Please cut down on the drinking.

## 2017-03-14 ENCOUNTER — Other Ambulatory Visit (HOSPITAL_COMMUNITY)
Admission: RE | Admit: 2017-03-14 | Discharge: 2017-03-14 | Disposition: A | Discharge status: Home / Self Care | Payer: Medicare Other | Source: Ambulatory Visit | Attending: Physician Assistant | Admitting: Physician Assistant

## 2017-03-14 ENCOUNTER — Encounter (INDEPENDENT_AMBULATORY_CARE_PROVIDER_SITE_OTHER): Payer: Self-pay | Admitting: Physician Assistant

## 2017-03-14 ENCOUNTER — Ambulatory Visit (INDEPENDENT_AMBULATORY_CARE_PROVIDER_SITE_OTHER): Payer: Medicare Other | Admitting: Physician Assistant

## 2017-03-14 VITALS — BP 152/82 | HR 89 | Temp 98.0°F | Ht 64.57 in | Wt 160.2 lb

## 2017-03-14 DIAGNOSIS — Z124 Encounter for screening for malignant neoplasm of cervix: Secondary | ICD-10-CM

## 2017-03-14 DIAGNOSIS — Z23 Encounter for immunization: Secondary | ICD-10-CM | POA: Diagnosis not present

## 2017-03-14 DIAGNOSIS — Z1231 Encounter for screening mammogram for malignant neoplasm of breast: Secondary | ICD-10-CM

## 2017-03-14 DIAGNOSIS — Z1239 Encounter for other screening for malignant neoplasm of breast: Secondary | ICD-10-CM

## 2017-03-14 DIAGNOSIS — Z1211 Encounter for screening for malignant neoplasm of colon: Secondary | ICD-10-CM

## 2017-03-14 DIAGNOSIS — E2839 Other primary ovarian failure: Secondary | ICD-10-CM | POA: Diagnosis not present

## 2017-03-14 DIAGNOSIS — Z0000 Encounter for general adult medical examination without abnormal findings: Secondary | ICD-10-CM

## 2017-03-14 NOTE — Progress Notes (Signed)
Subjective:  Patient ID: Otis Braceelores M Nou, female    DOB: 05-Jun-1950  Age: 66 y.o. MRN: [id number]  CC: annual physical  HPI  Josselyne Gardiner RhymeM Karaffa is a 66 y.o. female with a medical history of MI, cardiomyopathy, mitral regurgitation, HTN, s/p CABG, cocaine use, and insomnia presents for an annual physical. Feels well and has no complaints.   Outpatient Medications Prior to Visit  Medication Sig Dispense Refill  . aspirin 81 MG tablet Take 1 tablet (81 mg total) by mouth daily. 90 tablet 3  . atorvastatin (LIPITOR) 20 MG tablet Take 1 tablet (20 mg total) by mouth daily. 90 tablet 3  . BIOTIN PO Take 1 tablet by mouth daily.    . hydrochlorothiazide (HYDRODIURIL) 12.5 MG tablet Take 1 tablet (12.5 mg total) by mouth daily. 90 tablet 3  . metoprolol succinate (TOPROL XL) 25 MG 24 hr tablet Take 1 tablet (25 mg total) by mouth daily. 90 tablet 3  . zolpidem (AMBIEN) 5 MG tablet Take 0.5 tablets (2.5 mg total) by mouth at bedtime as needed. for sleep 30 tablet 0   No facility-administered medications prior to visit.      ROS Review of Systems  Constitutional: Negative for chills, fever and malaise/fatigue.  Eyes: Negative for blurred vision.  Respiratory: Negative for shortness of breath.   Cardiovascular: Negative for chest pain and palpitations.  Gastrointestinal: Negative for abdominal pain and nausea.  Genitourinary: Negative for dysuria and hematuria.  Musculoskeletal: Negative for joint pain and myalgias.  Skin: Negative for rash.  Neurological: Negative for tingling and headaches.  Psychiatric/Behavioral: Negative for depression. The patient is not nervous/anxious.     Objective:  There were no vitals taken for this visit.  BP/Weight 02/13/2017 06/30/2016 08/11/2013  Systolic BP 149 164 153  Diastolic BP 82 96 76  Wt. (Lbs) 159.4 170 180.6  BMI 25.73 27.44 29.16      Physical Exam  Constitutional: She is oriented to person, place, and time.  Well developed, well  nourished, NAD, polite  HENT:  Head: Normocephalic and atraumatic.  Eyes: Conjunctivae are normal. No scleral icterus.  Neck: Normal range of motion. Neck supple. No thyromegaly present.  Cardiovascular: Normal rate, regular rhythm and normal heart sounds.   Pulmonary/Chest: Effort normal and breath sounds normal. No respiratory distress. She has no wheezes.  Abdominal: Soft. Bowel sounds are normal. She exhibits no distension. There is no tenderness.  Genitourinary:  Genitourinary Comments: Normal vagina with no discharge. Cervix without erythema, friability, or motion tenderness. No adnexal mass or tenderness bilaterally. No uterine mass or tenderness.   Musculoskeletal: She exhibits no edema.  LEs, UEs, and back with full aROM.  Lymphadenopathy:    She has no cervical adenopathy.  Neurological: She is alert and oriented to person, place, and time. No cranial nerve deficit.  Skin: Skin is warm and dry. No rash noted. No erythema. No pallor.  Psychiatric: She has a normal mood and affect. Her behavior is normal. Thought content normal.  Vitals reviewed.    Assessment & Plan:   1. Annual physical exam - Hepatic Function Panel - Lipid Panel  2. Screening for cervical cancer - Cytology - PAP [place]  3. Screening for breast cancer - MS DIGITAL SCREENING BILATERAL; Future  4. Special screening for malignant neoplasms, colon - Fecal occult blood, imunochemical  5. Estrogen deficiency - DG Bone Density; Future  6. Need for prophylactic vaccination against Streptococcus pneumoniae (pneumococcus) - Pneumococcal conjugate vaccine 13-valent   Follow-up:  PRN  Loletta Specter PA

## 2017-03-15 DIAGNOSIS — Z1211 Encounter for screening for malignant neoplasm of colon: Secondary | ICD-10-CM | POA: Diagnosis not present

## 2017-03-16 LAB — HEPATIC FUNCTION PANEL
ALBUMIN: 4.3 g/dL (ref 3.6–4.8)
ALK PHOS: 83 IU/L (ref 39–117)
ALT: 35 IU/L — AB (ref 0–32)
AST: 40 IU/L (ref 0–40)
BILIRUBIN, DIRECT: 0.15 mg/dL (ref 0.00–0.40)
Bilirubin Total: 0.5 mg/dL (ref 0.0–1.2)
Total Protein: 7.8 g/dL (ref 6.0–8.5)

## 2017-03-16 LAB — LIPID PANEL
CHOL/HDL RATIO: 5.1 ratio — AB (ref 0.0–4.4)
Cholesterol, Total: 263 mg/dL — ABNORMAL HIGH (ref 100–199)
HDL: 52 mg/dL (ref >39)
LDL Calculated: 166 mg/dL — ABNORMAL HIGH (ref 0–99)
Triglycerides: 224 mg/dL — ABNORMAL HIGH (ref 0–149)
VLDL Cholesterol Cal: 45 mg/dL — ABNORMAL HIGH (ref 5–40)

## 2017-03-18 ENCOUNTER — Other Ambulatory Visit (INDEPENDENT_AMBULATORY_CARE_PROVIDER_SITE_OTHER): Payer: Self-pay | Admitting: Physician Assistant

## 2017-03-18 ENCOUNTER — Telehealth (INDEPENDENT_AMBULATORY_CARE_PROVIDER_SITE_OTHER): Payer: Self-pay

## 2017-03-18 DIAGNOSIS — E785 Hyperlipidemia, unspecified: Secondary | ICD-10-CM

## 2017-03-18 LAB — CYTOLOGY - PAP
BACTERIAL VAGINITIS: NEGATIVE
CANDIDA VAGINITIS: NEGATIVE
CHLAMYDIA, DNA PROBE: NEGATIVE
DIAGNOSIS: NEGATIVE
Neisseria Gonorrhea: NEGATIVE
TRICH (WINDOWPATH): NEGATIVE

## 2017-03-18 MED ORDER — ATORVASTATIN CALCIUM 40 MG PO TABS: 40.0000 mg | ORAL_TABLET | Freq: Every day | ORAL | 3 refills | Status: DC

## 2017-03-18 NOTE — Telephone Encounter (Signed)
Patient is aware of dosage increase for atorvastatin. Maryjean Morn, CMA

## 2017-03-18 NOTE — Telephone Encounter (Signed)
-----   Message from Loletta Specter, PA-C sent at 03/18/2017  8:33 AM EDT ----- Needs higher dose of atorvastatin. I will send out new rx for 40 mg of atorvastatin. She may take two of the twenty milligram tablets to make 40 mg for the time being until she picks up her 40 mg tablets. Sent to [facility].

## 2017-03-19 ENCOUNTER — Telehealth (INDEPENDENT_AMBULATORY_CARE_PROVIDER_SITE_OTHER): Payer: Self-pay

## 2017-03-19 NOTE — Telephone Encounter (Signed)
-----   Message from Loletta Specter, PA-C sent at 03/18/2017  5:35 PM EDT ----- PAP completely negative.

## 2017-03-19 NOTE — Telephone Encounter (Signed)
Patient aware of negative pap. Sharrie Self S Sarh Kirschenbaum, CMA  

## 2017-03-20 LAB — FECAL OCCULT BLOOD, IMMUNOCHEMICAL: FECAL OCCULT BLD: NEGATIVE

## 2017-03-21 ENCOUNTER — Telehealth (INDEPENDENT_AMBULATORY_CARE_PROVIDER_SITE_OTHER): Payer: Self-pay

## 2017-03-21 NOTE — Telephone Encounter (Signed)
Patient aware of negative occult blood. Dawn Cruz, CMA

## 2017-03-21 NOTE — Telephone Encounter (Signed)
-----   Message from Loletta Specter, PA-C sent at 03/20/2017  5:22 PM EDT ----- Negative for occult blood.

## 2017-10-29 ENCOUNTER — Ambulatory Visit (INDEPENDENT_AMBULATORY_CARE_PROVIDER_SITE_OTHER): Payer: Medicare Other | Admitting: Physician Assistant

## 2017-10-29 ENCOUNTER — Other Ambulatory Visit: Payer: Self-pay

## 2017-10-29 ENCOUNTER — Encounter (INDEPENDENT_AMBULATORY_CARE_PROVIDER_SITE_OTHER): Payer: Self-pay | Admitting: Physician Assistant

## 2017-10-29 VITALS — BP 160/83 | HR 81 | Temp 98.0°F | Ht 66.0 in | Wt 154.0 lb

## 2017-10-29 DIAGNOSIS — F17200 Nicotine dependence, unspecified, uncomplicated: Secondary | ICD-10-CM | POA: Diagnosis not present

## 2017-10-29 DIAGNOSIS — Z0000 Encounter for general adult medical examination without abnormal findings: Secondary | ICD-10-CM | POA: Diagnosis not present

## 2017-10-29 DIAGNOSIS — I10 Essential (primary) hypertension: Secondary | ICD-10-CM | POA: Diagnosis not present

## 2017-10-29 MED ORDER — HYDROCHLOROTHIAZIDE 25 MG PO TABS: 25.0000 mg | ORAL_TABLET | Freq: Every day | ORAL | 3 refills | Status: DC

## 2017-10-29 MED ORDER — VARENICLINE TARTRATE 0.5 MG X 11 & 1 MG X 42 PO MISC: ORAL | 0 refills | Status: DC

## 2017-10-29 MED ORDER — AMLODIPINE BESYLATE 5 MG PO TABS: 5.0000 mg | ORAL_TABLET | Freq: Every day | ORAL | 3 refills | Status: DC

## 2017-10-29 MED ORDER — METOPROLOL SUCCINATE ER 25 MG PO TB24: 25.0000 mg | ORAL_TABLET | Freq: Every day | ORAL | 3 refills | Status: DC

## 2017-10-29 NOTE — Progress Notes (Signed)
Subjective:  Patient ID: Dawn Cruz, female    DOB: Oct 18, 1950  Age: 67 y.o. MRN: [id number]  CC: HTN  HPI Dawn Cruz is a 67 y.o. female with a medical history of HTN, HLD, MR, cardiomyopathy, cardiac arrest, MI, tobacco use disorder, and cocaine use presents for a "wellness exam". Needs her blood pressure and BMI checked in order to collect $50. Still smokes but has decreased to "one to two cigarettes" per day. Drinks one or two cocktails per day. Feels well and denies any complaints.     Outpatient Medications Prior to Visit  Medication Sig Dispense Refill  . aspirin 81 MG tablet Take 1 tablet (81 mg total) by mouth daily. 90 tablet 3  . atorvastatin (LIPITOR) 40 MG tablet Take 1 tablet (40 mg total) by mouth daily. 90 tablet 3  . BIOTIN PO Take 1 tablet by mouth daily.    . hydrochlorothiazide (HYDRODIURIL) 12.5 MG tablet Take 1 tablet (12.5 mg total) by mouth daily. 90 tablet 3  . metoprolol succinate (TOPROL XL) 25 MG 24 hr tablet Take 1 tablet (25 mg total) by mouth daily. 90 tablet 3  . zolpidem (AMBIEN) 5 MG tablet Take 0.5 tablets (2.5 mg total) by mouth at bedtime as needed. for sleep 30 tablet 0   No facility-administered medications prior to visit.      ROS Review of Systems  Constitutional: Negative for chills, fever and malaise/fatigue.  Eyes: Negative for blurred vision.  Respiratory: Negative for shortness of breath.   Cardiovascular: Negative for chest pain and palpitations.  Gastrointestinal: Negative for abdominal pain and nausea.  Genitourinary: Negative for dysuria and hematuria.  Musculoskeletal: Negative for joint pain and myalgias.  Skin: Negative for rash.  Neurological: Negative for tingling and headaches.  Psychiatric/Behavioral: Negative for depression. The patient is not nervous/anxious.     Objective:   Vitals:   10/29/17 1404  BP: (!) 160/83  Pulse: 81  Temp: 98 F (36.7 C)  SpO2: 97%      BP/Weight 03/14/2017 02/13/2017  06/30/2016  Systolic BP 152 149 164  Diastolic BP 82 82 96  Wt. (Lbs) 160.2 159.4 170  BMI 27.02 25.73 27.44      Physical Exam  Constitutional: She is oriented to person, place, and time.  Well developed, well nourished, NAD, polite  HENT:  Head: Normocephalic and atraumatic.  Eyes: No scleral icterus.  Neck: Normal range of motion. Neck supple. No thyromegaly present.  Cardiovascular: Normal rate, regular rhythm and normal heart sounds. Exam reveals no gallop and no friction rub.  No murmur heard. Pulmonary/Chest: Effort normal and breath sounds normal. No stridor. No respiratory distress. She has no wheezes. She has no rales.  Abdominal: Soft. Bowel sounds are normal. There is no tenderness.  Musculoskeletal: She exhibits no edema.  Neurological: She is alert and oriented to person, place, and time.  Skin: Skin is warm and dry. No rash noted. No erythema. No pallor.  Psychiatric: She has a normal mood and affect. Her behavior is normal. Thought content normal.  Vitals reviewed.    Assessment & Plan:    1. Hypertension, unspecified type - Uncontrolled today - Increase HCTZ to 25 mg qam - Begin Amlodipine 5 mg one tablet qday - Continue on Metoprolol succinate 25 mg qday  2. Wellness examination - BMI 24.9  - Advised to perform regular low intensity exercise    Follow-up: 4 weeks for HTN  Loletta Specter PA

## 2017-10-29 NOTE — Patient Instructions (Signed)
Managing Your Hypertension Hypertension is commonly called high blood pressure. This is when the force of your blood pressing against the walls of your arteries is too strong. Arteries are blood vessels that carry blood from your heart throughout your body. Hypertension forces the heart to work harder to pump blood, and may cause the arteries to become narrow or stiff. Having untreated or uncontrolled hypertension can cause heart attack, stroke, kidney disease, and other problems. What are blood pressure readings? A blood pressure reading consists of a higher number over a lower number. Ideally, your blood pressure should be below 120/80. The first ("top") number is called the systolic pressure. It is a measure of the pressure in your arteries as your heart beats. The second ("bottom") number is called the diastolic pressure. It is a measure of the pressure in your arteries as the heart relaxes. What does my blood pressure reading mean? Blood pressure is classified into four stages. Based on your blood pressure reading, your health care provider may use the following stages to determine what type of treatment you need, if any. Systolic pressure and diastolic pressure are measured in a unit called mm Hg. Normal  Systolic pressure: below 120.  Diastolic pressure: below 80. Elevated  Systolic pressure: 120-129.  Diastolic pressure: below 80. Hypertension stage 1  Systolic pressure: 130-139.  Diastolic pressure: 80-89. Hypertension stage 2  Systolic pressure: 140 or above.  Diastolic pressure: 90 or above. What health risks are associated with hypertension? Managing your hypertension is an important responsibility. Uncontrolled hypertension can lead to:  A heart attack.  A stroke.  A weakened blood vessel (aneurysm).  Heart failure.  Kidney damage.  Eye damage.  Metabolic syndrome.  Memory and concentration problems.  What changes can I make to manage my  hypertension? Hypertension can be managed by making lifestyle changes and possibly by taking medicines. Your health care provider will help you make a plan to bring your blood pressure within a normal range. Eating and drinking  Eat a diet that is high in fiber and potassium, and low in salt (sodium), added sugar, and fat. An example eating plan is called the DASH (Dietary Approaches to Stop Hypertension) diet. To eat this way: ? Eat plenty of fresh fruits and vegetables. Try to fill half of your plate at each meal with fruits and vegetables. ? Eat whole grains, such as whole wheat pasta, brown rice, or whole grain bread. Fill about one quarter of your plate with whole grains. ? Eat low-fat diary products. ? Avoid fatty cuts of meat, processed or cured meats, and poultry with skin. Fill about one quarter of your plate with lean proteins such as fish, chicken without skin, beans, eggs, and tofu. ? Avoid premade and processed foods. These tend to be higher in sodium, added sugar, and fat.  Reduce your daily sodium intake. Most people with hypertension should eat less than 1,500 mg of sodium a day.  Limit alcohol intake to no more than 1 drink a day for nonpregnant women and 2 drinks a day for men. One drink equals 12 oz of beer, 5 oz of wine, or 1 oz of hard liquor. Lifestyle  Work with your health care provider to maintain a healthy body weight, or to lose weight. Ask what an ideal weight is for you.  Get at least 30 minutes of exercise that causes your heart to beat faster (aerobic exercise) most days of the week. Activities may include walking, swimming, or biking.  Include exercise   to strengthen your muscles (resistance exercise), such as weight lifting, as part of your weekly exercise routine. Try to do these types of exercises for 30 minutes at least 3 days a week.  Do not use any products that contain nicotine or tobacco, such as cigarettes and e-cigarettes. If you need help quitting, ask  your health care provider.  Control any long-term (chronic) conditions you have, such as high cholesterol or diabetes. Monitoring  Monitor your blood pressure at home as told by your health care provider. Your personal target blood pressure may vary depending on your medical conditions, your age, and other factors.  Have your blood pressure checked regularly, as often as told by your health care provider. Working with your health care provider  Review all the medicines you take with your health care provider because there may be side effects or interactions.  Talk with your health care provider about your diet, exercise habits, and other lifestyle factors that may be contributing to hypertension.  Visit your health care provider regularly. Your health care provider can help you create and adjust your plan for managing hypertension. Will I need medicine to control my blood pressure? Your health care provider may prescribe medicine if lifestyle changes are not enough to get your blood pressure under control, and if:  Your systolic blood pressure is 130 or higher.  Your diastolic blood pressure is 80 or higher.  Take medicines only as told by your health care provider. Follow the directions carefully. Blood pressure medicines must be taken as prescribed. The medicine does not work as well when you skip doses. Skipping doses also puts you at risk for problems. Contact a health care provider if:  You think you are having a reaction to medicines you have taken.  You have repeated (recurrent) headaches.  You feel dizzy.  You have swelling in your ankles.  You have trouble with your vision. Get help right away if:  You develop a severe headache or confusion.  You have unusual weakness or numbness, or you feel faint.  You have severe pain in your chest or abdomen.  You vomit repeatedly.  You have trouble breathing. Summary  Hypertension is when the force of blood pumping through  your arteries is too strong. If this condition is not controlled, it may put you at risk for serious complications.  Your personal target blood pressure may vary depending on your medical conditions, your age, and other factors. For most people, a normal blood pressure is less than 120/80.  Hypertension is managed by lifestyle changes, medicines, or both. Lifestyle changes include weight loss, eating a healthy, low-sodium diet, exercising more, and limiting alcohol. This information is not intended to replace advice given to you by your health care provider. Make sure you discuss any questions you have with your health care provider. Document Released: 01/30/2012 Document Revised: 04/04/2016 Document Reviewed: 04/04/2016 Elsevier Interactive Patient Education  2018 [employer]  

## 2018-01-13 ENCOUNTER — Other Ambulatory Visit (INDEPENDENT_AMBULATORY_CARE_PROVIDER_SITE_OTHER): Payer: Self-pay | Admitting: Physician Assistant

## 2018-01-13 DIAGNOSIS — E785 Hyperlipidemia, unspecified: Secondary | ICD-10-CM

## 2018-01-13 NOTE — Telephone Encounter (Signed)
FWD to PCP. [name], CMA  

## 2018-02-25 ENCOUNTER — Other Ambulatory Visit (INDEPENDENT_AMBULATORY_CARE_PROVIDER_SITE_OTHER): Payer: Self-pay | Admitting: Physician Assistant

## 2018-02-25 DIAGNOSIS — I10 Essential (primary) hypertension: Secondary | ICD-10-CM

## 2018-02-25 NOTE — Telephone Encounter (Signed)
FWD to PCP. [name], CMA  

## 2018-04-09 ENCOUNTER — Other Ambulatory Visit (INDEPENDENT_AMBULATORY_CARE_PROVIDER_SITE_OTHER): Payer: Self-pay | Admitting: Physician Assistant

## 2018-04-09 DIAGNOSIS — I10 Essential (primary) hypertension: Secondary | ICD-10-CM

## 2018-04-09 NOTE — Telephone Encounter (Signed)
FWD to PCP. [name], CMA  

## 2018-04-11 ENCOUNTER — Other Ambulatory Visit (INDEPENDENT_AMBULATORY_CARE_PROVIDER_SITE_OTHER): Payer: Self-pay | Admitting: Physician Assistant

## 2018-04-11 DIAGNOSIS — I10 Essential (primary) hypertension: Secondary | ICD-10-CM

## 2018-04-29 ENCOUNTER — Ambulatory Visit (INDEPENDENT_AMBULATORY_CARE_PROVIDER_SITE_OTHER): Payer: Medicare Other | Admitting: Physician Assistant

## 2018-05-22 ENCOUNTER — Other Ambulatory Visit: Payer: Self-pay

## 2018-05-22 ENCOUNTER — Ambulatory Visit (INDEPENDENT_AMBULATORY_CARE_PROVIDER_SITE_OTHER): Payer: Medicare Other | Admitting: Physician Assistant

## 2018-05-22 ENCOUNTER — Encounter (INDEPENDENT_AMBULATORY_CARE_PROVIDER_SITE_OTHER): Payer: Self-pay | Admitting: Physician Assistant

## 2018-05-22 VITALS — BP 144/80 | HR 90 | Temp 98.1°F | Ht 66.0 in | Wt 157.2 lb

## 2018-05-22 DIAGNOSIS — F17210 Nicotine dependence, cigarettes, uncomplicated: Secondary | ICD-10-CM | POA: Diagnosis not present

## 2018-05-22 DIAGNOSIS — E785 Hyperlipidemia, unspecified: Secondary | ICD-10-CM

## 2018-05-22 DIAGNOSIS — I10 Essential (primary) hypertension: Secondary | ICD-10-CM | POA: Diagnosis not present

## 2018-05-22 DIAGNOSIS — F17200 Nicotine dependence, unspecified, uncomplicated: Secondary | ICD-10-CM

## 2018-05-22 MED ORDER — AMLODIPINE BESYLATE 5 MG PO TABS: 5.0000 mg | ORAL_TABLET | Freq: Every day | ORAL | 1 refills | Status: AC

## 2018-05-22 MED ORDER — METOPROLOL SUCCINATE ER 25 MG PO TB24: 25.0000 mg | ORAL_TABLET | Freq: Every day | ORAL | 1 refills | Status: AC

## 2018-05-22 MED ORDER — HYDROCHLOROTHIAZIDE 25 MG PO TABS: 25.0000 mg | ORAL_TABLET | Freq: Every day | ORAL | 1 refills | Status: AC

## 2018-05-22 MED ORDER — VARENICLINE TARTRATE 0.5 MG X 11 & 1 MG X 42 PO MISC: ORAL | 0 refills | Status: AC

## 2018-05-22 MED ORDER — ATORVASTATIN CALCIUM 40 MG PO TABS: 40.0000 mg | ORAL_TABLET | Freq: Every day | ORAL | 1 refills | Status: DC

## 2018-05-22 NOTE — Patient Instructions (Signed)

## 2018-05-22 NOTE — Progress Notes (Signed)
Subjective:  Patient ID: Dawn Cruz, female    DOB: 02-Apr-1951  Age: 68 y.o. MRN: [id number]  CC: HTN  HPI  Dawn Cruz is a 68 y.o. female with a medical history of HTN, HLD, MR, cardiomyopathy, cardiac arrest, MI, tobacco use disorder, and cocaine use presents to f/u on HTN. Last seen here nearly seven months ago with uncontrolled HTN at 160/83 mmHg. Prescribed HCTZ 25 mg and Amlodipine 5 mg. Advised to continue Metoprolol 25 mg. She was asked to return in one month for HTN f/u but failed to do so. BP today is 144/80 mmHg. Says she is not currently taking HCTZ because she did not know what is was for. Taking Amlodipine and Metoprolol as directed. Also, did not start Chantix as she did not hand in prescription. Smoking 3 cigarettes per night. Does not endorse any other symptoms and states she is feeling "great".     Outpatient Medications Prior to Visit  Medication Sig Dispense Refill  . amLODipine (NORVASC) 5 MG tablet Take 1 tablet (5 mg total) by mouth daily. 90 tablet 3  . aspirin 81 MG tablet Take 1 tablet (81 mg total) by mouth daily. 90 tablet 3  . atorvastatin (LIPITOR) 40 MG tablet TAKE 1 TABLET BY MOUTH DAILY 90 tablet 3  . metoprolol succinate (TOPROL XL) 25 MG 24 hr tablet Take 1 tablet (25 mg total) by mouth daily. 90 tablet 3  . hydrochlorothiazide (HYDRODIURIL) 25 MG tablet Take 1 tablet (25 mg total) by mouth daily. (Patient not taking: Reported on 05/22/2018) 90 tablet 3  . PAZEO 0.7 % SOLN Place 0.7 Bottles into both eyes every morning.  3  . varenicline (CHANTIX STARTING MONTH PAK) 0.5 MG X 11 & 1 MG X 42 tablet Take one 0.5 mg tablet by mouth once daily for 3 days, then increase to one 0.5 mg tablet twice daily for 4 days, then increase to one 1 mg tablet twice daily. (Patient not taking: Reported on 05/22/2018) 53 tablet 0  . BIOTIN PO Take 1 tablet by mouth daily.     No facility-administered medications prior to visit.      ROS Review of Systems   Constitutional: Negative for chills, fever and malaise/fatigue.  Eyes: Negative for blurred vision.  Respiratory: Negative for shortness of breath.   Cardiovascular: Negative for chest pain and palpitations.  Gastrointestinal: Negative for abdominal pain and nausea.  Genitourinary: Negative for dysuria and hematuria.  Musculoskeletal: Negative for joint pain and myalgias.  Skin: Negative for rash.  Neurological: Negative for tingling and headaches.  Psychiatric/Behavioral: Negative for depression. The patient is not nervous/anxious.     Objective:  BP (!) 144/80 (BP Location: Right Arm, Patient Position: Sitting, Cuff Size: Normal)   Pulse 90   Temp 98.1 F (36.7 C) (Oral)   Ht 5\' 6"  (1.676 m)   Wt 157 lb 3.2 oz (71.3 kg)   SpO2 98%   BMI 25.37 kg/m   BP/Weight 05/22/2018 10/29/2017 03/14/2017  Systolic BP 144 160 152  Diastolic BP 80 83 82  Wt. (Lbs) 157.2 154 160.2  BMI 25.37 24.86 27.02      Physical Exam Vitals signs reviewed.  Constitutional:      Comments: Well developed, well nourished, NAD, polite  HENT:     Head: Normocephalic and atraumatic.  Eyes:     General: No scleral icterus. Neck:     Musculoskeletal: Normal range of motion and neck supple.     Thyroid: No thyromegaly.  Cardiovascular:     Rate and Rhythm: Normal rate and regular rhythm.     Heart sounds: Normal heart sounds.  Pulmonary:     Effort: Pulmonary effort is normal.     Breath sounds: Normal breath sounds.  Abdominal:     General: Bowel sounds are normal.     Palpations: Abdomen is soft.     Tenderness: There is no abdominal tenderness.  Skin:    General: Skin is warm and dry.     Coloration: Skin is not pale.     Findings: No erythema or rash.  Neurological:     Mental Status: She is alert and oriented to person, place, and time.  Psychiatric:        Behavior: Behavior normal.        Thought Content: Thought content normal.      Assessment & Plan:    1. Hypertension,  unspecified type - Basic Metabolic Panel; Future - Lipid panel; Future - Refill hydrochlorothiazide (HYDRODIURIL) 25 MG tablet; Take 1 tablet (25 mg total) by mouth daily.  Dispense: 90 tablet; Refill: 1 - Refill amLODipine (NORVASC) 5 MG tablet; Take 1 tablet (5 mg total) by mouth daily.  Dispense: 90 tablet; Refill: 1 - Refill metoprolol succinate (TOPROL XL) 25 MG 24 hr tablet; Take 1 tablet (25 mg total) by mouth daily.  Dispense: 90 tablet; Refill: 1  2. Tobacco use disorder - Begin varenicline (CHANTIX STARTING MONTH PAK) 0.5 MG X 11 & 1 MG X 42 tablet; Take one 0.5 mg tablet by mouth once daily for 3 days, then increase to one 0.5 mg tablet twice daily for 4 days, then increase to one 1 mg tablet twice daily.  Dispense: 53 tablet; Refill: 0   Meds ordered this encounter  Medications  . varenicline (CHANTIX STARTING MONTH PAK) 0.5 MG X 11 & 1 MG X 42 tablet    Sig: Take one 0.5 mg tablet by mouth once daily for 3 days, then increase to one 0.5 mg tablet twice daily for 4 days, then increase to one 1 mg tablet twice daily.    Dispense:  53 tablet    Refill:  0    Order Specific Question:   Supervising Provider    Answer:   Hoy Register [[id number]]  . hydrochlorothiazide (HYDRODIURIL) 25 MG tablet    Sig: Take 1 tablet (25 mg total) by mouth daily.    Dispense:  90 tablet    Refill:  1    Order Specific Question:   Supervising Provider    Answer:   Hoy Register [[id number]]  . amLODipine (NORVASC) 5 MG tablet    Sig: Take 1 tablet (5 mg total) by mouth daily.    Dispense:  90 tablet    Refill:  1    Order Specific Question:   Supervising Provider    Answer:   Hoy Register [[id number]]  . metoprolol succinate (TOPROL XL) 25 MG 24 hr tablet    Sig: Take 1 tablet (25 mg total) by mouth daily.    Dispense:  90 tablet    Refill:  1    Order Specific Question:   Supervising Provider    Answer:   Hoy Register [[id number]]    Follow-up:   Loletta Specter PA

## 2018-05-23 ENCOUNTER — Other Ambulatory Visit (INDEPENDENT_AMBULATORY_CARE_PROVIDER_SITE_OTHER): Payer: Medicare Other

## 2018-05-23 DIAGNOSIS — I10 Essential (primary) hypertension: Secondary | ICD-10-CM

## 2018-05-23 NOTE — Progress Notes (Signed)
Labs collected by onsite [facility] phlebotomist. Merland Holness S Aften Lipsey, CMA  

## 2018-05-24 LAB — BASIC METABOLIC PANEL
BUN/Creatinine Ratio: 11 — ABNORMAL LOW (ref 12–28)
BUN: 10 mg/dL (ref 8–27)
CALCIUM: 9.4 mg/dL (ref 8.7–10.3)
CO2: 23 mmol/L (ref 20–29)
Chloride: 106 mmol/L (ref 96–106)
Creatinine, Ser: 0.91 mg/dL (ref 0.57–1.00)
GFR calc Af Amer: 76 mL/min/{1.73_m2} (ref >59)
GFR calc non Af Amer: 66 mL/min/{1.73_m2} (ref >59)
Glucose: 92 mg/dL (ref 65–99)
POTASSIUM: 3.6 mmol/L (ref 3.5–5.2)
Sodium: 146 mmol/L — ABNORMAL HIGH (ref 134–144)

## 2018-05-24 LAB — LIPID PANEL
Chol/HDL Ratio: 4 ratio (ref 0.0–4.4)
Cholesterol, Total: 229 mg/dL — ABNORMAL HIGH (ref 100–199)
HDL: 57 mg/dL (ref >39)
LDL Calculated: 144 mg/dL — ABNORMAL HIGH (ref 0–99)
Triglycerides: 139 mg/dL (ref 0–149)
VLDL Cholesterol Cal: 28 mg/dL (ref 5–40)

## 2018-05-28 ENCOUNTER — Other Ambulatory Visit (INDEPENDENT_AMBULATORY_CARE_PROVIDER_SITE_OTHER): Payer: Self-pay | Admitting: Physician Assistant

## 2018-05-28 DIAGNOSIS — E785 Hyperlipidemia, unspecified: Secondary | ICD-10-CM

## 2018-05-28 MED ORDER — ATORVASTATIN CALCIUM 40 MG PO TABS: 80.0000 mg | ORAL_TABLET | Freq: Every day | ORAL | 1 refills | Status: AC

## 2018-05-29 ENCOUNTER — Telehealth (INDEPENDENT_AMBULATORY_CARE_PROVIDER_SITE_OTHER): Payer: Self-pay

## 2018-05-29 NOTE — Telephone Encounter (Signed)
Patient is aware that LDL is better that one year ago but still moderately elevated. Take two atorvastatin pills at night instead of one. New Rx for atorvastatin has been sent to pharmacy. Triglycerides are now normal compared to last year. Maryjean Morn, CMA

## 2018-05-29 NOTE — Telephone Encounter (Signed)
-----   Message from Loletta Specter, PA-C sent at 05/28/2018  9:17 AM EST ----- LDL cholesterol is better than one year ago but still moderately elevated. I recommend she take two atorvastatin pills at night instead of one. I have also made new order of atorvastatin to have the pharmacy dispense more pills. Triglycerides are now normal compared to last year.

## 2018-07-01 ENCOUNTER — Encounter (HOSPITAL_COMMUNITY): Payer: Self-pay | Admitting: Emergency Medicine

## 2018-07-01 ENCOUNTER — Ambulatory Visit (HOSPITAL_COMMUNITY)
Admission: EM | Admit: 2018-07-01 | Discharge: 2018-07-01 | Disposition: A | Discharge status: Home / Self Care | Payer: Medicare Other | Attending: Family Medicine | Admitting: Family Medicine

## 2018-07-01 DIAGNOSIS — B349 Viral infection, unspecified: Secondary | ICD-10-CM | POA: Diagnosis not present

## 2018-07-01 MED ORDER — IPRATROPIUM BROMIDE 0.06 % NA SOLN: 2.0000 | Freq: Four times a day (QID) | NASAL | 0 refills | Status: AC

## 2018-07-01 MED ORDER — ONDANSETRON 4 MG PO TBDP: 4.0000 mg | ORAL_TABLET | Freq: Three times a day (TID) | ORAL | 0 refills | Status: AC | PRN

## 2018-07-01 MED ORDER — FLUTICASONE PROPIONATE 50 MCG/ACT NA SUSP: 2.0000 | Freq: Every day | NASAL | 0 refills | Status: AC

## 2018-07-01 NOTE — ED Notes (Signed)
Patient able to ambulate independently  

## 2018-07-01 NOTE — Discharge Instructions (Signed)
Zofran as needed for nausea/vomiting. Start flonase, atrovent nasal spray for nasal congestion/drainage. You can use over the counter nasal saline rinse such as neti pot for nasal congestion. Keep hydrated, your urine should be clear to pale yellow in color. Tylenol/motrin for fever and pain. Monitor for any worsening of symptoms, chest pain, shortness of breath, wheezing, swelling of the throat, follow up for reevaluation.  ° °For sore throat/cough try using a honey-based tea. Use 3 teaspoons of honey with juice squeezed from half lemon. Place shaved pieces of ginger into 1/2-1 cup of water and warm over stove top. Then mix the ingredients and repeat every 4 hours as needed. ° °

## 2018-07-01 NOTE — ED Triage Notes (Signed)
Pt presents to [facility] for assessment of diarrhea since yesterday, 1 episode of emesis, small cough, lack of appetite, sweating at night.

## 2018-07-01 NOTE — ED Provider Notes (Signed)
[facility]    CSN: [id number] Arrival date & time: 07/01/18  [id number]     History   Chief Complaint Chief Complaint  Patient presents with  . URI    HPI Dawn Cruz is a 68 y.o. female.   68 year old female comes in for 2-day history of URI symptoms.  Has a cough, nausea, vomiting, diarrhea.  Denies rhinorrhea, nasal congestion, sore throat.  Subjective fever, chills at nighttime.  States had one episode of vomiting.  Has not had nausea or vomiting since.  Has had decreased food intake, but continues to drink fluids without difficulty.  Denies shortness of breath, wheezing.  Current some day smoker.  Positive sick contact.     Past Medical History:  Diagnosis Date  . Acute MI (HCC)   . Cardiomyopathy, severe by echo with EF 10-20% 09/11/2011  . MR (mitral regurgitation), moderate 09/11/2011    Patient Active Problem List   Diagnosis Date Noted  . HTN (hypertension) 02/13/2017  . Cocaine abuse, drug screen positive 09/17/2011  . Hypokalemia 09/17/2011  . Renal insufficiency, SCr Nl on admission 09/14/2011    Class: Acute  . Cardiomyopathy, severe by echo with EF 10-20%, 09/10/11 09/11/2011  . MR (mitral regurgitation), moderate 09/11/2011  . Cardiac arrest - ventricular fibrillation, with [place] Protocol 09/09/2011  . Acute respiratory failure with hypoxia, self extubated 4/28 09/09/2011    History reviewed. No pertinent surgical history.  OB History   No obstetric history on file.      Home Medications    Prior to Admission medications   Medication Sig Start Date End Date Taking? Authorizing Provider  amLODipine (NORVASC) 5 MG tablet Take 1 tablet (5 mg total) by mouth daily. 05/22/18  Yes Loletta SpecterGomez, Roger David, PA-C  aspirin 81 MG tablet Take 1 tablet (81 mg total) by mouth daily. 02/13/17  Yes Loletta SpecterGomez, Roger David, PA-C  atorvastatin (LIPITOR) 40 MG tablet Take 2 tablets (80 mg total) by mouth daily. 05/28/18  Yes Loletta SpecterGomez, Roger David, PA-C    hydrochlorothiazide (HYDRODIURIL) 25 MG tablet Take 1 tablet (25 mg total) by mouth daily. 05/22/18  Yes Loletta SpecterGomez, Roger David, PA-C  metoprolol succinate (TOPROL XL) 25 MG 24 hr tablet Take 1 tablet (25 mg total) by mouth daily. 05/22/18  Yes Loletta SpecterGomez, Roger David, PA-C  fluticasone [facility](FLONASE) 50 MCG/ACT nasal spray Place 2 sprays into both nostrils daily. 07/01/18   Cathie HoopsYu, Amy V, PA-C  ipratropium (ATROVENT) 0.06 % nasal spray Place 2 sprays into both nostrils 4 (four) times daily. 07/01/18   Cathie HoopsYu, Amy V, PA-C  ondansetron (ZOFRAN ODT) 4 MG disintegrating tablet Take 1 tablet (4 mg total) by mouth every 8 (eight) hours as needed for nausea or vomiting. 07/01/18   Cathie HoopsYu, Amy V, PA-C  PAZEO 0.7 % SOLN Place 0.7 Bottles into both eyes every morning. 10/12/17   [provider]  varenicline (CHANTIX STARTING MONTH PAK) 0.5 MG X 11 & 1 MG X 42 tablet Take one 0.5 mg tablet by mouth once daily for 3 days, then increase to one 0.5 mg tablet twice daily for 4 days, then increase to one 1 mg tablet twice daily. 05/22/18   Loletta SpecterGomez, Roger David, PA-C    Family History History reviewed. No pertinent family history.  Social History Social History   Tobacco Use  . Smoking status: Current Some Day Smoker    Packs/day: 0.25    Years: 15.00    Pack years: 3.75    Types: Cigarettes  .  Smokeless tobacco: Never Used  Substance Use Topics  . Alcohol use: Yes    Alcohol/week: 9.0 standard drinks    Types: 9 Standard drinks or equivalent per week  . Drug use: Yes    Frequency: 1.0 times per week    Types: Cocaine     Allergies   Patient has no known allergies.   Review of Systems Review of Systems  Reason unable to perform ROS: See HPI as above.     Physical Exam Triage Vital Signs ED Triage Vitals  Enc Vitals Group     BP 07/01/18 1031 125/67     Pulse Rate 07/01/18 1031 94     Resp 07/01/18 1031 16     Temp 07/01/18 1031 99.3 F (37.4 C)     Temp Source 07/01/18 1031 Oral     SpO2 07/01/18 1031 98 %      Weight --      Height --      Head Circumference --      Peak Flow --      Pain Score 07/01/18 1033 0     Pain Loc --      Pain Edu? --      Excl. in GC? --    No data found.  Updated Vital Signs BP 125/67 (BP Location: Right Arm)   Pulse 94   Temp 99.3 F (37.4 C) (Oral)   Resp 16   SpO2 98%   Visual Acuity Right Eye Distance:   Left Eye Distance:   Bilateral Distance:    Right Eye Near:   Left Eye Near:    Bilateral Near:     Physical Exam Constitutional:      General: She is not in acute distress.    Appearance: She is well-developed. She is not ill-appearing, toxic-appearing or diaphoretic.  HENT:     Head: Normocephalic and atraumatic.     Right Ear: Ear canal and external ear normal. Tympanic membrane is not erythematous or bulging.     Left Ear: Ear canal and external ear normal. Tympanic membrane is not erythematous or bulging.     Ears:     Comments: Bilateral TM opaque.    Nose: Congestion and rhinorrhea present.     Right Sinus: No maxillary sinus tenderness or frontal sinus tenderness.     Left Sinus: No maxillary sinus tenderness or frontal sinus tenderness.     Mouth/Throat:     Mouth: Mucous membranes are moist.     Pharynx: Oropharynx is clear. Uvula midline.  Eyes:     Conjunctiva/sclera: Conjunctivae normal.     Pupils: Pupils are equal, round, and reactive to light.  Neck:     Musculoskeletal: Normal range of motion and neck supple.  Cardiovascular:     Rate and Rhythm: Normal rate and regular rhythm.     Heart sounds: Normal heart sounds. No murmur. No friction rub. No gallop.   Pulmonary:     Effort: Pulmonary effort is normal.     Breath sounds: Normal breath sounds. No decreased breath sounds, wheezing, rhonchi or rales.  Lymphadenopathy:     Cervical: No cervical adenopathy.  Skin:    General: Skin is warm and dry.  Neurological:     Mental Status: She is alert and oriented to person, place, and time.  Psychiatric:         Behavior: Behavior normal.        Judgment: Judgment normal.      UC Treatments /  Results  Labs (all labs ordered are listed, but only abnormal results are displayed) Labs Reviewed - No data to display  EKG None  Radiology No results found.  Procedures Procedures (including critical care time)  Medications Ordered in UC Medications - No data to display  Initial Impression / Assessment and Plan / UC Course  I have reviewed the triage vital signs and the nursing notes.  Pertinent labs & imaging results that were available during my care of the patient were reviewed by me and considered in my medical decision making (see chart for details).    Discussed with patient history and exam most consistent with viral URI. Symptomatic treatment as needed. Push fluids. Return precautions given.   Final Clinical Impressions(s) / UC Diagnoses   Final diagnoses:  Viral syndrome    ED Prescriptions    Medication Sig Dispense Auth. Provider   ipratropium (ATROVENT) 0.06 % nasal spray Place 2 sprays into both nostrils 4 (four) times daily. 15 mL Yu, Amy V, PA-C   fluticasone (FLONASE) 50 MCG/ACT nasal spray Place 2 sprays into both nostrils daily. 1 g Yu, Amy V, PA-C   ondansetron (ZOFRAN ODT) 4 MG disintegrating tablet Take 1 tablet (4 mg total) by mouth every 8 (eight) hours as needed for nausea or vomiting. 10 tablet Threasa Alpha, PA-C 07/01/18 1110

## 2018-10-24 ENCOUNTER — Inpatient Hospital Stay (HOSPITAL_COMMUNITY): Payer: Medicare Other

## 2018-10-24 ENCOUNTER — Inpatient Hospital Stay (HOSPITAL_COMMUNITY)
Admission: EM | Admit: 2018-10-24 | Discharge: 2018-11-19 | DRG: 308 | Disposition: E | Payer: Medicare Other | Attending: Pulmonary Disease | Admitting: Pulmonary Disease

## 2018-10-24 ENCOUNTER — Emergency Department (HOSPITAL_COMMUNITY): Payer: Medicare Other

## 2018-10-24 DIAGNOSIS — Z9911 Dependence on respirator [ventilator] status: Secondary | ICD-10-CM | POA: Diagnosis not present

## 2018-10-24 DIAGNOSIS — E872 Acidosis, unspecified: Secondary | ICD-10-CM

## 2018-10-24 DIAGNOSIS — Z20828 Contact with and (suspected) exposure to other viral communicable diseases: Secondary | ICD-10-CM | POA: Diagnosis present

## 2018-10-24 DIAGNOSIS — I63512 Cerebral infarction due to unspecified occlusion or stenosis of left middle cerebral artery: Secondary | ICD-10-CM | POA: Diagnosis present

## 2018-10-24 DIAGNOSIS — F17210 Nicotine dependence, cigarettes, uncomplicated: Secondary | ICD-10-CM | POA: Diagnosis present

## 2018-10-24 DIAGNOSIS — I429 Cardiomyopathy, unspecified: Secondary | ICD-10-CM

## 2018-10-24 DIAGNOSIS — E785 Hyperlipidemia, unspecified: Secondary | ICD-10-CM | POA: Diagnosis present

## 2018-10-24 DIAGNOSIS — I5042 Chronic combined systolic (congestive) and diastolic (congestive) heart failure: Secondary | ICD-10-CM | POA: Diagnosis not present

## 2018-10-24 DIAGNOSIS — N179 Acute kidney failure, unspecified: Secondary | ICD-10-CM

## 2018-10-24 DIAGNOSIS — I5023 Acute on chronic systolic (congestive) heart failure: Secondary | ICD-10-CM

## 2018-10-24 DIAGNOSIS — I110 Hypertensive heart disease with heart failure: Secondary | ICD-10-CM | POA: Diagnosis present

## 2018-10-24 DIAGNOSIS — I447 Left bundle-branch block, unspecified: Secondary | ICD-10-CM | POA: Diagnosis present

## 2018-10-24 DIAGNOSIS — Z4659 Encounter for fitting and adjustment of other gastrointestinal appliance and device: Secondary | ICD-10-CM

## 2018-10-24 DIAGNOSIS — Z9114 Patient's other noncompliance with medication regimen: Secondary | ICD-10-CM

## 2018-10-24 DIAGNOSIS — E876 Hypokalemia: Secondary | ICD-10-CM | POA: Diagnosis present

## 2018-10-24 DIAGNOSIS — I252 Old myocardial infarction: Secondary | ICD-10-CM

## 2018-10-24 DIAGNOSIS — I4901 Ventricular fibrillation: Principal | ICD-10-CM | POA: Diagnosis present

## 2018-10-24 DIAGNOSIS — R739 Hyperglycemia, unspecified: Secondary | ICD-10-CM | POA: Diagnosis present

## 2018-10-24 DIAGNOSIS — Z515 Encounter for palliative care: Secondary | ICD-10-CM

## 2018-10-24 DIAGNOSIS — J9621 Acute and chronic respiratory failure with hypoxia: Secondary | ICD-10-CM | POA: Diagnosis not present

## 2018-10-24 DIAGNOSIS — G931 Anoxic brain damage, not elsewhere classified: Secondary | ICD-10-CM | POA: Diagnosis present

## 2018-10-24 DIAGNOSIS — I340 Nonrheumatic mitral (valve) insufficiency: Secondary | ICD-10-CM

## 2018-10-24 DIAGNOSIS — E875 Hyperkalemia: Secondary | ICD-10-CM | POA: Diagnosis not present

## 2018-10-24 DIAGNOSIS — J9601 Acute respiratory failure with hypoxia: Secondary | ICD-10-CM | POA: Diagnosis present

## 2018-10-24 DIAGNOSIS — J690 Pneumonitis due to inhalation of food and vomit: Secondary | ICD-10-CM | POA: Diagnosis not present

## 2018-10-24 DIAGNOSIS — R680 Hypothermia, not associated with low environmental temperature: Secondary | ICD-10-CM | POA: Diagnosis present

## 2018-10-24 DIAGNOSIS — Z7951 Long term (current) use of inhaled steroids: Secondary | ICD-10-CM

## 2018-10-24 DIAGNOSIS — R579 Shock, unspecified: Secondary | ICD-10-CM | POA: Diagnosis not present

## 2018-10-24 DIAGNOSIS — I639 Cerebral infarction, unspecified: Secondary | ICD-10-CM | POA: Diagnosis not present

## 2018-10-24 DIAGNOSIS — I255 Ischemic cardiomyopathy: Secondary | ICD-10-CM | POA: Diagnosis present

## 2018-10-24 DIAGNOSIS — I462 Cardiac arrest due to underlying cardiac condition: Secondary | ICD-10-CM | POA: Diagnosis present

## 2018-10-24 DIAGNOSIS — R570 Cardiogenic shock: Secondary | ICD-10-CM | POA: Diagnosis present

## 2018-10-24 DIAGNOSIS — Z09 Encounter for follow-up examination after completed treatment for conditions other than malignant neoplasm: Secondary | ICD-10-CM

## 2018-10-24 DIAGNOSIS — F1410 Cocaine abuse, uncomplicated: Secondary | ICD-10-CM | POA: Diagnosis present

## 2018-10-24 DIAGNOSIS — I428 Other cardiomyopathies: Secondary | ICD-10-CM | POA: Diagnosis present

## 2018-10-24 DIAGNOSIS — I4891 Unspecified atrial fibrillation: Secondary | ICD-10-CM | POA: Diagnosis present

## 2018-10-24 DIAGNOSIS — I469 Cardiac arrest, cause unspecified: Secondary | ICD-10-CM | POA: Diagnosis present

## 2018-10-24 DIAGNOSIS — Z79899 Other long term (current) drug therapy: Secondary | ICD-10-CM

## 2018-10-24 DIAGNOSIS — Z7982 Long term (current) use of aspirin: Secondary | ICD-10-CM

## 2018-10-24 DIAGNOSIS — Z66 Do not resuscitate: Secondary | ICD-10-CM | POA: Diagnosis not present

## 2018-10-24 DIAGNOSIS — I2510 Atherosclerotic heart disease of native coronary artery without angina pectoris: Secondary | ICD-10-CM | POA: Diagnosis present

## 2018-10-24 DIAGNOSIS — J9811 Atelectasis: Secondary | ICD-10-CM | POA: Diagnosis present

## 2018-10-24 DIAGNOSIS — I5043 Acute on chronic combined systolic (congestive) and diastolic (congestive) heart failure: Secondary | ICD-10-CM | POA: Diagnosis present

## 2018-10-24 DIAGNOSIS — J9602 Acute respiratory failure with hypercapnia: Secondary | ICD-10-CM | POA: Diagnosis not present

## 2018-10-24 LAB — PROTIME-INR
INR: 1.4 — ABNORMAL HIGH (ref 0.8–1.2)
INR: 1.3 — ABNORMAL HIGH (ref 0.8–1.2)
Prothrombin Time: 16.6 seconds — ABNORMAL HIGH (ref 11.4–15.2)
Prothrombin Time: 16.3 seconds — ABNORMAL HIGH (ref 11.4–15.2)

## 2018-10-24 LAB — POCT I-STAT 7, (LYTES, BLD GAS, ICA,H+H)
Acid-base deficit: 9 mmol/L — ABNORMAL HIGH (ref 0.0–2.0)
Bicarbonate: 16.1 mmol/L — ABNORMAL LOW (ref 20.0–28.0)
Calcium, Ion: 1.33 mmol/L (ref 1.15–1.40)
HCT: 43 % (ref 36.0–46.0)
Hemoglobin: 14.6 g/dL (ref 12.0–15.0)
O2 Saturation: 95 %
Potassium: 3.3 mmol/L — ABNORMAL LOW (ref 3.5–5.1)
Sodium: 133 mmol/L — ABNORMAL LOW (ref 135–145)
TCO2: 17 mmol/L — ABNORMAL LOW (ref 22–32)
pCO2 arterial: 31.1 mmHg — ABNORMAL LOW (ref 32.0–48.0)
pH, Arterial: 7.323 — ABNORMAL LOW (ref 7.350–7.450)
pO2, Arterial: 83 mmHg (ref 83.0–108.0)

## 2018-10-24 LAB — GLUCOSE, CAPILLARY
Glucose-Capillary: 227 mg/dL — ABNORMAL HIGH (ref 70–99)
Glucose-Capillary: 233 mg/dL — ABNORMAL HIGH (ref 70–99)
Glucose-Capillary: 214 mg/dL — ABNORMAL HIGH (ref 70–99)
Glucose-Capillary: 192 mg/dL — ABNORMAL HIGH (ref 70–99)

## 2018-10-24 LAB — MAGNESIUM: Magnesium: 2 mg/dL (ref 1.7–2.4)

## 2018-10-24 LAB — COMPREHENSIVE METABOLIC PANEL
ALT: 56 U/L — ABNORMAL HIGH (ref 0–44)
AST: 127 U/L — ABNORMAL HIGH (ref 15–41)
Albumin: 2.7 g/dL — ABNORMAL LOW (ref 3.5–5.0)
Alkaline Phosphatase: 88 U/L (ref 38–126)
Anion gap: 26 — ABNORMAL HIGH (ref 5–15)
BUN: 12 mg/dL (ref 8–23)
CO2: 12 mmol/L — ABNORMAL LOW (ref 22–32)
Calcium: 11 mg/dL — ABNORMAL HIGH (ref 8.9–10.3)
Chloride: 97 mmol/L — ABNORMAL LOW (ref 98–111)
Creatinine, Ser: 1.47 mg/dL — ABNORMAL HIGH (ref 0.44–1.00)
GFR calc Af Amer: 43 mL/min — ABNORMAL LOW (ref >60)
GFR calc non Af Amer: 37 mL/min — ABNORMAL LOW (ref >60)
Glucose, Bld: 523 mg/dL (ref 70–99)
Potassium: 3 mmol/L — ABNORMAL LOW (ref 3.5–5.1)
Sodium: 135 mmol/L (ref 135–145)
Total Bilirubin: 0.8 mg/dL (ref 0.3–1.2)
Total Protein: 5.9 g/dL — ABNORMAL LOW (ref 6.5–8.1)

## 2018-10-24 LAB — APTT
aPTT: 36 seconds (ref 24–36)
aPTT: 37 seconds — ABNORMAL HIGH (ref 24–36)

## 2018-10-24 LAB — HEMOGLOBIN A1C
Hgb A1c MFr Bld: 4.9 % (ref 4.8–5.6)
Mean Plasma Glucose: 93.93 mg/dL

## 2018-10-24 LAB — POCT I-STAT, CHEM 8
BUN: 21 mg/dL (ref 8–23)
BUN: 21 mg/dL (ref 8–23)
Calcium, Ion: 1.32 mmol/L (ref 1.15–1.40)
Calcium, Ion: 1.28 mmol/L (ref 1.15–1.40)
Chloride: 101 mmol/L (ref 98–111)
Chloride: 104 mmol/L (ref 98–111)
Creatinine, Ser: 1.6 mg/dL — ABNORMAL HIGH (ref 0.44–1.00)
Creatinine, Ser: 1.7 mg/dL — ABNORMAL HIGH (ref 0.44–1.00)
Glucose, Bld: 241 mg/dL — ABNORMAL HIGH (ref 70–99)
Glucose, Bld: 201 mg/dL — ABNORMAL HIGH (ref 70–99)
HCT: 42 % (ref 36.0–46.0)
HCT: 41 % (ref 36.0–46.0)
Hemoglobin: 14.3 g/dL (ref 12.0–15.0)
Hemoglobin: 13.9 g/dL (ref 12.0–15.0)
Potassium: 2.4 mmol/L — CL (ref 3.5–5.1)
Potassium: 2.5 mmol/L — CL (ref 3.5–5.1)
Sodium: 137 mmol/L (ref 135–145)
Sodium: 138 mmol/L (ref 135–145)
TCO2: 20 mmol/L — ABNORMAL LOW (ref 22–32)
TCO2: 18 mmol/L — ABNORMAL LOW (ref 22–32)

## 2018-10-24 LAB — URINALYSIS, ROUTINE W REFLEX MICROSCOPIC
Bilirubin Urine: NEGATIVE
Glucose, UA: >=500 mg/dL — AB
Ketones, ur: NEGATIVE mg/dL
Leukocytes,Ua: NEGATIVE
Nitrite: NEGATIVE
Protein, ur: >=300 mg/dL — AB
Specific Gravity, Urine: 1.013 (ref 1.005–1.030)
pH: 6 (ref 5.0–8.0)

## 2018-10-24 LAB — CBC WITH DIFFERENTIAL/PLATELET
Abs Immature Granulocytes: 0.4 10*3/uL — ABNORMAL HIGH (ref 0.00–0.07)
Basophils Absolute: 0 10*3/uL (ref 0.0–0.1)
Basophils Relative: 0 %
Eosinophils Absolute: 0.1 10*3/uL (ref 0.0–0.5)
Eosinophils Relative: 1 %
HCT: 41.3 % (ref 36.0–46.0)
Hemoglobin: 12.9 g/dL (ref 12.0–15.0)
Lymphocytes Relative: 60 %
Lymphs Abs: 6.2 10*3/uL — ABNORMAL HIGH (ref 0.7–4.0)
MCH: 37 pg — ABNORMAL HIGH (ref 26.0–34.0)
MCHC: 31.2 g/dL (ref 30.0–36.0)
MCV: 118.3 fL — ABNORMAL HIGH (ref 80.0–100.0)
Metamyelocytes Relative: 2 %
Monocytes Absolute: 0.1 10*3/uL (ref 0.1–1.0)
Monocytes Relative: 1 %
Myelocytes: 1 %
Neutro Abs: 3.5 10*3/uL (ref 1.7–7.7)
Neutrophils Relative %: 34 %
Platelets: 157 10*3/uL (ref 150–400)
Promyelocytes Relative: 1 %
RBC: 3.49 MIL/uL — ABNORMAL LOW (ref 3.87–5.11)
RDW: 12.5 % (ref 11.5–15.5)
WBC: 10.3 10*3/uL (ref 4.0–10.5)
nRBC: 0.5 % — ABNORMAL HIGH (ref 0.0–0.2)
nRBC: 1 /100 WBC — ABNORMAL HIGH

## 2018-10-24 LAB — FIBRINOGEN: Fibrinogen: 336 mg/dL (ref 210–475)

## 2018-10-24 LAB — D-DIMER, QUANTITATIVE: D-Dimer, Quant: >20 ug/mL-FEU — ABNORMAL HIGH (ref 0.00–0.50)

## 2018-10-24 LAB — POCT I-STAT 4, (NA,K, GLUC, HGB,HCT)
Glucose, Bld: 336 mg/dL — ABNORMAL HIGH (ref 70–99)
HCT: 41 % (ref 36.0–46.0)
Hemoglobin: 13.9 g/dL (ref 12.0–15.0)
Potassium: 2.7 mmol/L — CL (ref 3.5–5.1)
Sodium: 136 mmol/L (ref 135–145)

## 2018-10-24 LAB — RAPID URINE DRUG SCREEN, HOSP PERFORMED
Amphetamines: NOT DETECTED
Barbiturates: NOT DETECTED
Benzodiazepines: NOT DETECTED
Cocaine: NOT DETECTED
Opiates: NOT DETECTED
Tetrahydrocannabinol: NOT DETECTED

## 2018-10-24 LAB — ECHOCARDIOGRAM COMPLETE
Height: 64 in
Weight: 2400 oz

## 2018-10-24 LAB — MRSA PCR SCREENING: MRSA by PCR: NEGATIVE

## 2018-10-24 LAB — LACTIC ACID, PLASMA
Lactic Acid, Venous: >11 mmol/L (ref 0.5–1.9)
Lactic Acid, Venous: 7.3 mmol/L (ref 0.5–1.9)

## 2018-10-24 LAB — LACTATE DEHYDROGENASE: LDH: 388 U/L — ABNORMAL HIGH (ref 98–192)

## 2018-10-24 LAB — PROCALCITONIN
Procalcitonin: 0.1 ng/mL
Procalcitonin: 56.34 ng/mL

## 2018-10-24 LAB — STREP PNEUMONIAE URINARY ANTIGEN: Strep Pneumo Urinary Antigen: NEGATIVE

## 2018-10-24 LAB — SARS CORONAVIRUS 2 BY RT PCR (HOSPITAL ORDER, PERFORMED IN ~~LOC~~ HOSPITAL LAB): SARS Coronavirus 2: NEGATIVE

## 2018-10-24 LAB — C-REACTIVE PROTEIN: CRP: <0.8 mg/dL (ref <1.0)

## 2018-10-24 LAB — TRIGLYCERIDES: Triglycerides: 204 mg/dL — ABNORMAL HIGH (ref <150)

## 2018-10-24 LAB — FERRITIN: Ferritin: 734 ng/mL — ABNORMAL HIGH (ref 11–307)

## 2018-10-24 LAB — TROPONIN I
Troponin I: 18.31 ng/mL (ref <0.03)
Troponin I: 30.19 ng/mL (ref <0.03)

## 2018-10-24 LAB — BETA-HYDROXYBUTYRIC ACID: Beta-Hydroxybutyric Acid: 0.14 mmol/L (ref 0.05–0.27)

## 2018-10-24 MED ORDER — NOREPINEPHRINE 4 MG/250ML-% IV SOLN
0.0000 ug/min | INTRAVENOUS | Status: DC
  Administered 2018-10-24: 19:00:00 20 ug/min via INTRAVENOUS
  Administered 2018-10-25: 01:00:00 16 ug/min via INTRAVENOUS
  Filled 2018-10-24 (×3): qty 250

## 2018-10-24 MED ORDER — CHLORHEXIDINE GLUCONATE CLOTH 2 % EX PADS: 6.0000 | MEDICATED_PAD | Freq: Every day | CUTANEOUS | Status: DC

## 2018-10-24 MED ORDER — PIPERACILLIN-TAZOBACTAM 3.375 G IVPB 30 MIN: 3.3750 g | Freq: Once | INTRAVENOUS | Status: DC

## 2018-10-24 MED ORDER — IOHEXOL 350 MG/ML SOLN
100.0000 mL | Freq: Once | INTRAVENOUS | Status: AC | PRN
  Administered 2018-10-24: 100 mL via INTRAVENOUS

## 2018-10-24 MED ORDER — FAMOTIDINE 40 MG/5ML PO SUSR
20.0000 mg | Freq: Every day | ORAL | Status: DC
  Administered 2018-10-24 – 2018-10-26 (×3): 20 mg
  Filled 2018-10-24 (×3): qty 2.5

## 2018-10-24 MED ORDER — HEPARIN SODIUM (PORCINE) 5000 UNIT/ML IJ SOLN
5000.0000 [IU] | Freq: Three times a day (TID) | INTRAMUSCULAR | Status: DC
  Administered 2018-10-24 – 2018-10-26 (×5): 5000 [IU] via SUBCUTANEOUS
  Filled 2018-10-24 (×5): qty 1

## 2018-10-24 MED ORDER — POTASSIUM CHLORIDE 20 MEQ/15ML (10%) PO SOLN
40.0000 meq | Freq: Once | ORAL | Status: AC
  Administered 2018-10-24: 40 meq via ORAL
  Filled 2018-10-24: qty 30

## 2018-10-24 MED ORDER — ORAL CARE MOUTH RINSE
15.0000 mL | OROMUCOSAL | Status: DC
  Administered 2018-10-24 – 2018-10-27 (×25): 15 mL via OROMUCOSAL

## 2018-10-24 MED ORDER — SODIUM CHLORIDE 0.9 % IV SOLN
3.0000 g | Freq: Three times a day (TID) | INTRAVENOUS | Status: DC
  Administered 2018-10-24 – 2018-10-25 (×2): 3 g via INTRAVENOUS
  Filled 2018-10-24 (×5): qty 3

## 2018-10-24 MED ORDER — CISATRACURIUM BOLUS VIA INFUSION
0.1000 mg/kg | Freq: Once | INTRAVENOUS | Status: DC
  Filled 2018-10-24: qty 7

## 2018-10-24 MED ORDER — POTASSIUM CHLORIDE 10 MEQ/50ML IV SOLN
10.0000 meq | INTRAVENOUS | Status: AC
  Administered 2018-10-25 (×6): 10 meq via INTRAVENOUS
  Filled 2018-10-24 (×6): qty 50

## 2018-10-24 MED ORDER — VANCOMYCIN HCL 10 G IV SOLR
1250.0000 mg | Freq: Once | INTRAVENOUS | Status: AC
  Administered 2018-10-24: 1250 mg via INTRAVENOUS
  Filled 2018-10-24: qty 1250

## 2018-10-24 MED ORDER — MIDAZOLAM HCL 2 MG/2ML IJ SOLN
1.0000 mg | Freq: Once | INTRAMUSCULAR | Status: AC
  Administered 2018-10-24: 1 mg via INTRAVENOUS
  Filled 2018-10-24: qty 2

## 2018-10-24 MED ORDER — MIDAZOLAM 50MG/50ML (1MG/ML) PREMIX INFUSION
2.0000 mg/h | INTRAVENOUS | Status: DC
  Administered 2018-10-24 – 2018-10-26 (×3): 2 mg/h via INTRAVENOUS
  Administered 2018-10-27: 5 mg/h via INTRAVENOUS
  Filled 2018-10-24 (×4): qty 50

## 2018-10-24 MED ORDER — SODIUM CHLORIDE 0.9% FLUSH
10.0000 mL | Freq: Two times a day (BID) | INTRAVENOUS | Status: DC
  Administered 2018-10-24 – 2018-10-26 (×3): 10 mL

## 2018-10-24 MED ORDER — SODIUM CHLORIDE 0.9 % IV SOLN
INTRAVENOUS | Status: DC | PRN
  Administered 2018-10-24: 500 mL via INTRAVENOUS

## 2018-10-24 MED ORDER — SODIUM CHLORIDE 0.9 % IV SOLN
1.0000 ug/kg/min | INTRAVENOUS | Status: DC
  Administered 2018-10-24: 16:00:00 1 ug/kg/min via INTRAVENOUS
  Filled 2018-10-24: qty 20

## 2018-10-24 MED ORDER — FENTANYL BOLUS VIA INFUSION
25.0000 ug | INTRAVENOUS | Status: DC | PRN
  Filled 2018-10-24: qty 25

## 2018-10-24 MED ORDER — CHLORHEXIDINE GLUCONATE 0.12% ORAL RINSE (MEDLINE KIT)
15.0000 mL | Freq: Two times a day (BID) | OROMUCOSAL | Status: DC
  Administered 2018-10-24 – 2018-10-26 (×5): 15 mL via OROMUCOSAL

## 2018-10-24 MED ORDER — FENTANYL CITRATE (PF) 100 MCG/2ML IJ SOLN
50.0000 ug | Freq: Once | INTRAMUSCULAR | Status: AC
  Administered 2018-10-24: 50 ug via INTRAVENOUS
  Filled 2018-10-24: qty 2

## 2018-10-24 MED ORDER — INSULIN ASPART 100 UNIT/ML ~~LOC~~ SOLN
0.0000 [IU] | SUBCUTANEOUS | Status: DC
  Administered 2018-10-24: 11 [IU] via SUBCUTANEOUS
  Administered 2018-10-24: 21:00:00 5 [IU] via SUBCUTANEOUS
  Administered 2018-10-25 (×6): 3 [IU] via SUBCUTANEOUS
  Administered 2018-10-25: 09:00:00 5 [IU] via SUBCUTANEOUS

## 2018-10-24 MED ORDER — CISATRACURIUM BOLUS VIA INFUSION
0.0500 mg/kg | INTRAVENOUS | Status: DC | PRN
  Filled 2018-10-24: qty 4

## 2018-10-24 MED ORDER — ASPIRIN 300 MG RE SUPP
300.0000 mg | RECTAL | Status: AC
  Administered 2018-10-24: 18:00:00 300 mg via RECTAL
  Filled 2018-10-24: qty 1

## 2018-10-24 MED ORDER — CHLORHEXIDINE GLUCONATE CLOTH 2 % EX PADS
6.0000 | MEDICATED_PAD | Freq: Every day | CUTANEOUS | Status: DC
  Administered 2018-10-24 – 2018-10-26 (×3): 6 via TOPICAL

## 2018-10-24 MED ORDER — POTASSIUM CHLORIDE 10 MEQ/50ML IV SOLN
10.0000 meq | INTRAVENOUS | Status: AC
  Administered 2018-10-24 (×4): 10 meq via INTRAVENOUS
  Filled 2018-10-24 (×8): qty 50

## 2018-10-24 MED ORDER — AMIODARONE HCL IN DEXTROSE 360-4.14 MG/200ML-% IV SOLN
30.0000 mg/h | INTRAVENOUS | Status: DC
  Administered 2018-10-24 – 2018-10-27 (×7): 30 mg/h via INTRAVENOUS
  Filled 2018-10-24 (×5): qty 200

## 2018-10-24 MED ORDER — FENTANYL 2500MCG IN NS 250ML (10MCG/ML) PREMIX INFUSION
100.0000 ug/h | INTRAVENOUS | Status: DC
  Administered 2018-10-24: 16:00:00 100 ug/h via INTRAVENOUS
  Administered 2018-10-25: 23:00:00 125 ug/h via INTRAVENOUS
  Administered 2018-10-25: 200 ug/h via INTRAVENOUS
  Administered 2018-10-26: 275 ug/h via INTRAVENOUS
  Administered 2018-10-27: 300 ug/h via INTRAVENOUS
  Filled 2018-10-24 (×5): qty 250

## 2018-10-24 MED ORDER — ARTIFICIAL TEARS OPHTHALMIC OINT
1.0000 "application " | TOPICAL_OINTMENT | Freq: Three times a day (TID) | OPHTHALMIC | Status: DC
  Administered 2018-10-24 – 2018-10-27 (×8): 1 via OPHTHALMIC
  Filled 2018-10-24 (×2): qty 3.5

## 2018-10-24 MED ORDER — SODIUM CHLORIDE 0.9 % IV SOLN
INTRAVENOUS | Status: DC
  Administered 2018-10-24 (×2): via INTRAVENOUS

## 2018-10-24 MED ORDER — MIDAZOLAM BOLUS VIA INFUSION
1.0000 mg | INTRAVENOUS | Status: DC | PRN
  Filled 2018-10-24: qty 1

## 2018-10-24 MED ORDER — AMIODARONE HCL IN DEXTROSE 360-4.14 MG/200ML-% IV SOLN
60.0000 mg/h | INTRAVENOUS | Status: AC
  Administered 2018-10-24: 60 mg/h via INTRAVENOUS
  Filled 2018-10-24: qty 200

## 2018-10-24 MED ORDER — SODIUM CHLORIDE 0.9% FLUSH: 10.0000 mL | INTRAVENOUS | Status: DC | PRN

## 2018-10-24 MED FILL — Medication: Qty: 1 | Status: AC

## 2018-10-24 NOTE — ED Notes (Signed)
1228 Dr. Adela Lank inserted #18 right EJ 1230 pulse check patient was in v-fib 1231 shocked at 200 1232 shocked at 200 x 2 1233 shocked T 200 1235 Amiodarone 150 mg IV push 1237 epip x 1 right EJ 1238 v-fib shocked at 200 faint femoral pulse rate 37 u/s showing faint cardiac activity 1239 Atropine 1 mg 1241 59/37- pulse 84  1242 no puse CPR 1243 Epip x 1  1244 Norepip started at 61mcg/min king airway removed and patient was intubated with 7.5  1249 faint pulse  norepip paused iv right neck out.  818563/14- rate 90 sat 91% #16 OG inserted per Dr. Adela Lank 1256 Dr. Adela Lank inserted #18 right EJ , #20 left hand.  1258 Norepip started right hand, Amiodarone drip at 60 started in left hand

## 2018-10-24 NOTE — ED Provider Notes (Addendum)
[facility] EMERGENCY DEPARTMENT Provider Note   CSN: [id number] Arrival date & time: 10/31/2018  1225    History   Chief Complaint Chief Complaint  Patient presents with   Cardiac Arrest    HPI Dawn Cruz is a 68 y.o. female.     68 yo F with chief complaint of cardiac arrest.  Patient was at work at a daycare center and suddenly collapsed.  Per EMS had about 5 minutes of downtime and then CPR was started.  Had multiple shocks with return of spontaneous circulation just prior to arrival.  Had a [facility] airway in place.  Initial rhythm was atrial fibrillation.  Has a history of a heart attack in the past.  No noted history of recent symptoms.  Level 5 caveat unresponsive.  The history is provided by the EMS personnel.  Cardiac Arrest  Witnessed by:  Co-worker Incident location:  Work Time since incident:  25 minutes Time before BLS initiated:  3-5 minutes Time before ALS initiated:  3-5 minutes Condition upon EMS arrival:  Apneic and unresponsive Pulse:  Absent Initial cardiac rhythm per EMS:  Atrial fibrillation Treatments prior to arrival:  ACLS protocol and AED discharged (king airway placed) Medications given prior to ED:  Amiodarone and epinephrine Defibrillation prior to arrival:  200 J Number of shocks delivered:  10 or more Defibrillation successful: yes   Rhythm after defibrillation:  Normal sinus rhythm Airway:  Intubation in ED Rhythm on admission to ED:  Ventricular fibrillation Associated symptoms: no chest pain, no dizziness, no palpitations, no shortness of breath and no vomiting     Past Medical History:  Diagnosis Date   Acute MI (HCC)    Cardiomyopathy, severe by echo with EF 10-20% 09/11/2011   MR (mitral regurgitation), moderate 09/11/2011    Patient Active Problem List   Diagnosis Date Noted   Cardiac arrest with ventricular fibrillation (HCC) 10/25/2018   HTN (hypertension) 02/13/2017   Cocaine abuse, drug screen  positive 09/17/2011   Hypokalemia 09/17/2011   Renal insufficiency, SCr Nl on admission 09/14/2011    Class: Acute   Cardiomyopathy, severe by echo with EF 10-20%, 09/10/11 09/11/2011   MR (mitral regurgitation), moderate 09/11/2011   Cardiac arrest - ventricular fibrillation, with [place] [place] Protocol 09/09/2011   Acute respiratory failure with hypoxia, self extubated 4/28 09/09/2011    No past surgical history on file.   OB History   No obstetric history on file.      Home Medications    Prior to Admission medications   Medication Sig Start Date End Date Taking? Authorizing Provider  amLODipine (NORVASC) 5 MG tablet Take 1 tablet (5 mg total) by mouth daily. 05/22/18   Loletta Specter, PA-C  aspirin 81 MG tablet Take 1 tablet (81 mg total) by mouth daily. 02/13/17   Loletta Specter, PA-C  atorvastatin (LIPITOR) 40 MG tablet Take 2 tablets (80 mg total) by mouth daily. 05/28/18   Loletta Specter, PA-C  fluticasone [facility]) 50 MCG/ACT nasal spray Place 2 sprays into both nostrils daily. 07/01/18   Cathie Hoops, Amy V, PA-C  hydrochlorothiazide (HYDRODIURIL) 25 MG tablet Take 1 tablet (25 mg total) by mouth daily. 05/22/18   Loletta Specter, PA-C  ipratropium (ATROVENT) 0.06 % nasal spray Place 2 sprays into both nostrils 4 (four) times daily. 07/01/18   Cathie Hoops, Amy V, PA-C  metoprolol succinate (TOPROL XL) 25 MG 24 hr tablet Take 1 tablet (25 mg total) by mouth daily. 05/22/18  Loletta Specter, PA-C  ondansetron [facility] ODT) 4 MG disintegrating tablet Take 1 tablet (4 mg total) by mouth every 8 (eight) hours as needed for nausea or vomiting. 07/01/18   Cathie Hoops, Amy V, PA-C  PAZEO 0.7 % SOLN Place 0.7 Bottles into both eyes every morning. 10/12/17   [provider]  varenicline (CHANTIX STARTING MONTH PAK) 0.5 MG X 11 & 1 MG X 42 tablet Take one 0.5 mg tablet by mouth once daily for 3 days, then increase to one 0.5 mg tablet twice daily for 4 days, then increase to one 1 mg tablet twice  daily. 05/22/18   Loletta Specter, PA-C    Family History No family history on file.  Social History Social History   Tobacco Use   Smoking status: Current Some Day Smoker    Packs/day: 0.25    Years: 15.00    Pack years: 3.75    Types: Cigarettes   Smokeless tobacco: Never Used  Substance Use Topics   Alcohol use: Yes    Alcohol/week: 9.0 standard drinks    Types: 9 Standard drinks or equivalent per week   Drug use: Yes    Frequency: 1.0 times per week    Types: Cocaine     Allergies   Patient has no known allergies.   Review of Systems Review of Systems  Unable to perform ROS: Mental status change  Constitutional: Negative for chills and fever.  HENT: Negative for congestion and rhinorrhea.   Eyes: Negative for redness and visual disturbance.  Respiratory: Negative for shortness of breath and wheezing.   Cardiovascular: Negative for chest pain and palpitations.  Gastrointestinal: Negative for nausea and vomiting.  Genitourinary: Negative for dysuria and urgency.  Musculoskeletal: Negative for arthralgias and myalgias.  Skin: Negative for pallor and wound.  Neurological: Negative for dizziness and headaches.     Physical Exam Updated Vital Signs BP 111/82    Pulse (!) 117    Temp (!) 97.1 F (36.2 C)    Resp (!) 31    Ht 5\' 4"  (1.626 m)    Wt 68 kg    SpO2 97%    BMI 25.75 kg/m   Physical Exam Vitals signs and nursing note reviewed.  Constitutional:      General: She is not in acute distress.    Appearance: She is well-developed. She is ill-appearing. She is not diaphoretic.  HENT:     Head: Normocephalic and atraumatic.  Eyes:     Pupils: Pupils are equal, round, and reactive to light.  Neck:     Musculoskeletal: Normal range of motion and neck supple.  Cardiovascular:     Rate and Rhythm: Normal rate and regular rhythm.     Heart sounds: No murmur. No friction rub. No gallop.   Pulmonary:     Effort: Pulmonary effort is normal.     Breath  sounds: No wheezing or rales.  Abdominal:     General: There is no distension.     Palpations: Abdomen is soft.     Tenderness: There is no abdominal tenderness.  Musculoskeletal:        General: No tenderness.  Skin:    General: Skin is warm and dry.  Neurological:     Mental Status: She is alert.      ED Treatments / Results  Labs (all labs ordered are listed, but only abnormal results are displayed) Labs Reviewed  LACTIC ACID, PLASMA - Abnormal; Notable for the following components:  Result Value   Lactic Acid, Venous >11.0 (*)    All other components within normal limits  CBC WITH DIFFERENTIAL/PLATELET - Abnormal; Notable for the following components:   RBC 3.49 (*)    MCV 118.3 (*)    MCH 37.0 (*)    nRBC 0.5 (*)    Lymphs Abs 6.2 (*)    nRBC 1 (*)    Abs Immature Granulocytes 0.40 (*)    All other components within normal limits  COMPREHENSIVE METABOLIC PANEL - Abnormal; Notable for the following components:   Potassium 3.0 (*)    Chloride 97 (*)    CO2 12 (*)    Glucose, Bld 523 (*)    Creatinine, Ser 1.47 (*)    Calcium 11.0 (*)    Total Protein 5.9 (*)    Albumin 2.7 (*)    AST 127 (*)    ALT 56 (*)    GFR calc non Af Amer 37 (*)    GFR calc Af Amer 43 (*)    Anion gap 26 (*)    All other components within normal limits  D-DIMER, QUANTITATIVE (NOT AT [facility]) - Abnormal; Notable for the following components:   D-Dimer, Quant >20.00 (*)    All other components within normal limits  LACTATE DEHYDROGENASE - Abnormal; Notable for the following components:   LDH 388 (*)    All other components within normal limits  TRIGLYCERIDES - Abnormal; Notable for the following components:   Triglycerides 204 (*)    All other components within normal limits  FERRITIN - Abnormal; Notable for the following components:   Ferritin 734 (*)    All other components within normal limits  POCT I-STAT 7, (LYTES, BLD GAS, ICA,H+H) - Abnormal; Notable for the following  components:   pH, Arterial 7.323 (*)    pCO2 arterial 31.1 (*)    Bicarbonate 16.1 (*)    TCO2 17 (*)    Acid-base deficit 9.0 (*)    Sodium 133 (*)    Potassium 3.3 (*)    All other components within normal limits  SARS CORONAVIRUS 2 (HOSPITAL ORDER, PERFORMED IN [facility])  CULTURE, BLOOD (ROUTINE X 2)  CULTURE, BLOOD (ROUTINE X 2)  CULTURE, RESPIRATORY  CULTURE, BLOOD (ROUTINE X 2)  CULTURE, BLOOD (ROUTINE X 2)  FIBRINOGEN  C-REACTIVE PROTEIN  LACTIC ACID, PLASMA  PROCALCITONIN  TROPONIN I  TROPONIN I  TROPONIN I  BASIC METABOLIC PANEL  PROTIME-INR  PROTIME-INR  APTT  APTT  BLOOD GAS, ARTERIAL  BLOOD GAS, ARTERIAL  CBC  MAGNESIUM  STREP PNEUMONIAE URINARY ANTIGEN  LEGIONELLA PNEUMOPHILA SEROGP 1 UR AG  PROCALCITONIN  LACTIC ACID, PLASMA    EKG EKG Interpretation  Date/Time:  Friday October 24 2018 12:55:38 EDT Ventricular Rate:  94 PR Interval:    QRS Duration: 168 QT Interval:  432 QTC Calculation: 541 R Axis:   -13 Text Interpretation:  Sinus rhythm Atrial premature complex Left bundle branch block widened qrs Confirmed by Melene Plan [id number]) on 11/01/2018 1:55:20 PM   Radiology Dg Chest Portable 1 View  Result Date: 10/20/2018 CLINICAL DATA:  Intubation, CPR EXAM: PORTABLE CHEST 1 VIEW COMPARISON:  Portable exam 1300 hours compared to 09/17/2011 FINDINGS: Tip of endotracheal tube projects 1.9 cm above carina. Nasogastric tube extends into stomach. Upper normal heart size. RIGHT upper lobe infiltrate which could represent pneumonia or aspiration. Remaining lungs grossly clear. No pleural effusion or pneumothorax. IMPRESSION: RIGHT upper lobe infiltrate question pneumonia versus aspiration. Electronically Signed  By: Ulyses Southward M.D.   On: Nov 03, 2018 13:12    Procedures Procedure Name: Intubation Date/Time: 11/03/18 2:19 PM Performed by: Melene Plan, DO Pre-anesthesia Checklist: Patient identified Oxygen Delivery Method: Ambu  bag Preoxygenation: Pre-oxygenation with 100% oxygen Laryngoscope Size: Glidescope Grade View: Grade I Tube size: 7.5 mm Number of attempts: 1 Placement Confirmation: ETT inserted through vocal cords under direct vision Secured at: 23 cm Dental Injury: Teeth and Oropharynx as per pre-operative assessment  Difficulty Due To: Difficulty was anticipated Future Recommendations: Recommend- induction with short-acting agent, and alternative techniques readily available    .Central Line Date/Time: 11-03-18 2:20 PM Performed by: Melene Plan, DO Authorized by: Melene Plan, DO   Consent:    Consent obtained:  Emergent situation   Risks discussed:  Arterial puncture, bleeding, incorrect placement, infection and nerve damage   Alternatives discussed:  No treatment Pre-procedure details:    Hand hygiene: Hand hygiene performed prior to insertion     All elements of maximal sterile barrier technique followed: no sterile gowns available.     Skin preparation:  ChloraPrep   Skin preparation agent: Skin preparation agent completely dried prior to procedure   Anesthesia (see MAR for exact dosages):    Anesthesia method:  None Procedure details:    Location:  R femoral   Site selection rationale:  A line placement at same location   Patient position:  Flat   Procedural supplies:  Triple lumen   Catheter size:  8 Fr   Landmarks identified: yes     Ultrasound guidance: yes     Sterile ultrasound techniques: Sterile gel and sterile probe covers were used     Number of attempts:  1   Successful placement: yes   Post-procedure details:    Post-procedure:  Dressing applied and line sutured   Assessment:  Blood return through all ports and free fluid flow   Patient tolerance of procedure:  Tolerated well, no immediate complications ARTERIAL LINE Date/Time: 2018-11-03 2:20 PM Performed by: Melene Plan, DO Authorized by: Melene Plan, DO   Consent:    Consent obtained:  Emergent situation   Risks  discussed:  Bleeding, infection, ischemia, pain and repeat procedure Indications:    Indications: hemodynamic monitoring and multiple ABGs   Pre-procedure details:    Skin preparation:  2% Chlorhexidine   Preparation: Patient was prepped and draped in sterile fashion   Anesthesia (see MAR for exact dosages):    Anesthesia method:  None Procedure details:    Location:  R femoral   Allen's test performed: no     Needle gauge:  20 G   Placement technique:  Seldinger and ultrasound guided   Number of attempts:  1   Transducer: waveform confirmed   Post-procedure details:    Post-procedure:  Biopatch applied, sterile dressing applied and sutured   CMS:  Unchanged   Patient tolerance of procedure:  Tolerated well, no immediate complications NG placement Date/Time: 2018-11-03 2:21 PM Performed by: Melene Plan, DO Authorized by: Melene Plan, DO  Consent: The procedure was performed in an emergent situation. Verbal consent not obtained. Patient identity confirmed: anonymous protocol, patient vented/unresponsive Time out: Immediately prior to procedure a "time out" was called to verify the correct patient, procedure, equipment, support staff and site/side marked as required. Preparation: Patient was prepped and draped in the usual sterile fashion. Local anesthesia used: no  Anesthesia: Local anesthesia used: no  Sedation: Patient sedated: no  Patient tolerance: Patient tolerated the procedure well with no immediate  complications  Ultrasound ED Echo Date/Time: 11/22/18 2:22 PM Performed by: Melene Plan, DO Authorized by: Melene Plan, DO   Procedure details:    Indications: cardiac arrest     Views: subxiphoid     Images: archived     Limitations:  Body habitus, increased thoracic air and patient compliance Findings:    Pericardium: no pericardial effusion     Cardiac Activity comment:  Hypodynamic   LV Function: severly depressed (<30%)     RV Diameter: normal   Impression:     Impression: decreased contractility    EJ placement: 18 gauge IV placed in R EJ. Skin prepped with alcohol pads, R EJ identified with Valsalva. Cannulated with good return of dark, non-pulsatile blood. Tachyderm placed after easily flushed with NS.  EJ placement: 18 gauge IV placed in L EJ. Skin prepped with alcohol pads, L EJ identified with Valsalva. Cannulated with good return of dark, non-pulsatile blood. Tachyderm placed after easily flushed with NS.  Cardiopulmonary Resuscitation (CPR) Procedure Note Directed/Performed by: Rae Roam I personally directed ancillary staff and/or performed CPR in an effort to regain return of spontaneous circulation and to maintain cardiac, neuro and systemic perfusion.    (including critical care time)  Medications Ordered in ED Medications  amiodarone (NEXTERONE PREMIX) 360-4.14 MG/200ML-% (1.8 mg/mL) IV infusion (has no administration in time range)  amiodarone (NEXTERONE PREMIX) 360-4.14 MG/200ML-% (1.8 mg/mL) IV infusion (has no administration in time range)  norepinephrine (LEVOPHED) 4mg  in premix infusion (has no administration in time range)  vancomycin (VANCOCIN) 1,250 mg in sodium chloride 0.9 % 250 mL IVPB (1,250 mg Intravenous New Bag/Given 2018/11/22 1428)  aspirin suppository 300 mg (has no administration in time range)  cisatracurium (NIMBEX) bolus via infusion 6.8 mg (has no administration in time range)    And  cisatracurium (NIMBEX) 200 mg in sodium chloride 0.9 % 200 mL (1 mg/mL) infusion (has no administration in time range)    And  cisatracurium (NIMBEX) bolus via infusion 3.4 mg (has no administration in time range)  artificial tears (LACRILUBE) ophthalmic ointment 1 application (has no administration in time range)  heparin injection 5,000 Units (has no administration in time range)  fentaNYL (SUBLIMAZE) injection 50 mcg (has no administration in time range)  fentaNYL in NS (61mcg/ml) infusion-PREMIX  (has no administration in time range)  fentaNYL (SUBLIMAZE) bolus via infusion 25 mcg (has no administration in time range)  midazolam (VERSED) injection 1 mg (has no administration in time range)  midazolam (VERSED) 50 mg/50 mL (1 mg/mL) premix infusion (has no administration in time range)  midazolam (VERSED) bolus via infusion 1 mg (has no administration in time range)  0.9 %  sodium chloride infusion (has no administration in time range)  famotidine (PEPCID) 40 MG/5ML suspension 20 mg (has no administration in time range)  potassium chloride 10 mEq in 50 mL *CENTRAL LINE* IVPB (has no administration in time range)  potassium chloride 20 MEQ/15ML (10%) solution 40 mEq (has no administration in time range)  Ampicillin-Sulbactam (UNASYN) 3 g in sodium chloride 0.9 % 100 mL IVPB (has no administration in time range)     Initial Impression / Assessment and Plan / ED Course  I have reviewed the triage vital signs and the nursing notes.  Pertinent labs & imaging results that were available during my care of the patient were reviewed by me and considered in my medical decision making (see chart for details).        67  yo F with a chief complaints of cardiac arrest.  Patient had about 5 minutes of downtime and CPR was started with EMS.  Patient lost pulses on arrival here, had about 40 to 45 minutes in total of CPR.  Obtained ROSC, patient was intubated, femoral and arterial line replaced.  Requiring some vasopressor support with Levophed.  Patient was seen in shock resistance ventricular fibrillation.  There is some concern that the EMSs amiodarone infiltrated and so she was given another bolus dose here.  Was started on a drip with stabilization of her rhythm.  Discussed with critical care who will come evaluate for the possibility of code cool.  Discussed with cardiology who come evaluate at bedside.  CRITICAL CARE Performed by: Rae Roam   Total critical care time: 80  minutes  Critical care time was exclusive of separately billable procedures and treating other patients.  Critical care was necessary to treat or prevent imminent or life-threatening deterioration.  Critical care was time spent personally by me on the following activities: development of treatment plan with patient and/or surrogate as well as nursing, discussions with consultants, evaluation of patient's response to treatment, examination of patient, obtaining history from patient or surrogate, ordering and performing treatments and interventions, ordering and review of laboratory studies, ordering and review of radiographic studies, pulse oximetry and re-evaluation of patient's condition.  The patients results and plan were reviewed and discussed.   Any x-rays performed were independently reviewed by myself.   Differential diagnosis were considered with the presenting HPI.  Medications  amiodarone (NEXTERONE PREMIX) 360-4.14 MG/200ML-% (1.8 mg/mL) IV infusion (has no administration in time range)  amiodarone (NEXTERONE PREMIX) 360-4.14 MG/200ML-% (1.8 mg/mL) IV infusion (has no administration in time range)  norepinephrine (LEVOPHED) 4mg  in premix infusion (has no administration in time range)  vancomycin (VANCOCIN) 1,250 mg in sodium chloride 0.9 % 250 mL IVPB (1,250 mg Intravenous New Bag/Given 11/05/2018 1428)  aspirin suppository 300 mg (has no administration in time range)  cisatracurium (NIMBEX) bolus via infusion 6.8 mg (has no administration in time range)    And  cisatracurium (NIMBEX) 200 mg in sodium chloride 0.9 % 200 mL (1 mg/mL) infusion (has no administration in time range)    And  cisatracurium (NIMBEX) bolus via infusion 3.4 mg (has no administration in time range)  artificial tears (LACRILUBE) ophthalmic ointment 1 application (has no administration in time range)  heparin injection 5,000 Units (has no administration in time range)  fentaNYL (SUBLIMAZE) injection 50 mcg  (has no administration in time range)  fentaNYL in NS (32mcg/ml) infusion-PREMIX (has no administration in time range)  fentaNYL (SUBLIMAZE) bolus via infusion 25 mcg (has no administration in time range)  midazolam (VERSED) injection 1 mg (has no administration in time range)  midazolam (VERSED) 50 mg/50 mL (1 mg/mL) premix infusion (has no administration in time range)  midazolam (VERSED) bolus via infusion 1 mg (has no administration in time range)  0.9 %  sodium chloride infusion (has no administration in time range)  famotidine (PEPCID) 40 MG/5ML suspension 20 mg (has no administration in time range)  potassium chloride 10 mEq in 50 mL *CENTRAL LINE* IVPB (has no administration in time range)  potassium chloride 20 MEQ/15ML (10%) solution 40 mEq (has no administration in time range)  Ampicillin-Sulbactam (UNASYN) 3 g in sodium chloride 0.9 % 100 mL IVPB (has no administration in time range)    Vitals:   11/06/2018 1345 10/23/2018 1400 11/01/2018 1415 10/26/2018 1430  BP: 126/90 116/85 110/77 111/82  Pulse:  (!) 119 (!) 117 (!) 117  Resp: (!) 30 (!) 32 (!) 35 (!) 31  Temp:   (!) 96.1 F (35.6 C) (!) 97.1 F (36.2 C)  SpO2:  100% 97% 97%  Weight:      Height:        Final diagnoses:  Cardiac arrest [facility])    Admission/ observation were discussed with the admitting physician, patient and/or family and they are comfortable with the plan.    Final Clinical Impressions(s) / ED Diagnoses   Final diagnoses:  Cardiac arrest [facility])    ED Discharge Orders    None       Melene Plan, DO 11/09/2018 1453    Melene Plan, DO 10/25/18 0933    Melene Plan, DO 10/25/18 1642

## 2018-10-24 NOTE — ED Notes (Signed)
Patient presents to ED via [facility] ,ems states patient was standing at the kitchen table and had a witnessed fall, approx. Down time 5 mins prior to ems arrival . EMS started cpr,  Patient was given amiodarone 450mg  , epip x 3, shocked x 4 per ems king airway inserted. Patient arrived on lucas cpr in progress.  1224 pulse check unable to feel femoral pulse.cpr started 1227 Epip x 1 HCO3 x 1 calcium chloride x 1.

## 2018-10-24 NOTE — Consult Note (Signed)
Requesting Physician: Dr. Isaiah Serge    Chief Complaint: unresponsive   History obtained from:Chart    HPI:                                                                                                                                       Dawn Cruz is a 68 y.o. female with history of cardiomyopathy coronary artery disease, hypertension, cocaine use, history of cardiac arrest in 2010 presents to ED with witnessed cardiac arrest.  Approximate downtime is 5 minutes followed by total CPR time of 40 minutes due to V. fib arrest.  Patient was intubated and started on pressors in the emergency department needed to PCCM who initiated hypothermia protocol CT head was performed which showed loss of gray-white matter in the left cortex concerning for acute stroke.  Neurology was consulted regarding CT findings.  On aspirin patient was intubated and paralyzed and was being cooled.  Discussed with CCM who stated prior to initiation of cooling she was moving the right leg spontaneously and had pupils that were reactive.  Stat CTA and CT perfusion was performed   Past Medical History:  Diagnosis Date  . Acute MI (HCC)   . Cardiomyopathy, severe by echo with EF 10-20% 09/11/2011  . MR (mitral regurgitation), moderate 09/11/2011    No past surgical history on file.  No family history on file. Social History:  reports that she has been smoking cigarettes. She has a 3.75 pack-year smoking history. She has never used smokeless tobacco. She reports current alcohol use of about 9.0 standard drinks of alcohol per week. She reports current drug use. Frequency: 1.00 time per week. Drug: Cocaine.  Allergies: No Known Allergies  Medications:                                                                                                                        I reviewed home medications   ROS:  Able to review systems as patient is intubated and paralyzed   Examination:                                                                                                      General: Appears well-developed and well-nourished.  Psych: Affect appropriate to situation Eyes: No scleral injection HENT: No OP obstrucion Head: Normocephalic.  Cardiovascular: Normal rate and regular rhythm.  Respiratory: Effort normal and breath sounds normal to anterior ascultation GI: Soft.  No distension. There is no tenderness.  Skin: WDI    Neurological Examination ( limited as patient is intubated and paralyzed) Mental Status: Patient does not respond to verbal stimuli.  Does not respond to deep sternal rub.  Does not follow commands.  No verbalizations noted.  Cranial Nerves: II: patient does not respond confrontation bilaterally, pupils not reactive,  III,IV,VI: doll's response absent bilaterally.  V,VII: corneal reflex: absent VIII: patient does not respond to verbal stimuli IX,X: gag reflex absent XI: trapezius strength unable to test bilaterally XII: tongue strength unable to test Motor: Extremities flaccid throughout.  No spontaneous movement noted.  No purposeful movements noted.  Sensory: Does not respond to noxious stimuli in any extremity. Deep Tendon Reflexes:  Absent throughout. Plantars:  Cerebellar: Unable to perform   Lab Results: Basic Metabolic Panel: Recent Labs  Lab 11/02/2018 1318 11/11/2018 1430  NA 135 133*  K 3.0* 3.3*  CL 97*  --   CO2 12*  --   GLUCOSE 523*  --   BUN 12  --   CREATININE 1.47*  --   CALCIUM 11.0*  --     CBC: Recent Labs  Lab 11/06/2018 1318 11/05/2018 1430  WBC 10.3  --   NEUTROABS 3.5  --   HGB 12.9 14.6  HCT 41.3 43.0  MCV 118.3*  --   PLT 157  --     Coagulation Studies: Recent Labs    11/04/2018 1719  LABPROT 16.6*  INR 1.4*    Imaging: Ct Head Wo Contrast  Result Date: 10/28/2018 CLINICAL DATA:   Collapse, cardiac arrest EXAM: CT HEAD WITHOUT CONTRAST TECHNIQUE: Contiguous axial images were obtained from the base of the skull through the vertex without intravenous contrast. COMPARISON:  09/11/2011 FINDINGS: Brain: There is subtle hypodensity and loss of gray-white distinction of the left MCA territory (series 2, image 21, series 4, image 32). Vascular: No hyperdense vessel or unexpected calcification. Skull: Normal. Negative for fracture or focal lesion. Sinuses/Orbits: No acute finding. Other: None. IMPRESSION: There is subtle hypodensity and loss of gray-white distinction of the left MCA territory (series 2, image 21, series 4, image 32), concerning for large left MCA territory infarction. Consider MRI to more sensitively evaluate for acute diffusion restricting infarction. No intracranial hemorrhage. Electronically Signed   By: Lauralyn PrimesAlex  Bibbey M.D.   On: 11/15/2018 16:54   Dg Chest Portable 1 View  Result Date: 11/18/2018 CLINICAL DATA:  Intubation, CPR EXAM: PORTABLE CHEST 1 VIEW COMPARISON:  Portable exam 1300 hours compared to 09/17/2011 FINDINGS: Tip of endotracheal tube projects 1.9 cm above carina. Nasogastric  tube extends into stomach. Upper normal heart size. RIGHT upper lobe infiltrate which could represent pneumonia or aspiration. Remaining lungs grossly clear. No pleural effusion or pneumothorax. IMPRESSION: RIGHT upper lobe infiltrate question pneumonia versus aspiration. Electronically Signed   By: Ulyses Southward M.D.   On: 11/01/2018 13:12     I have reviewed the above imaging :    ASSESSMENT AND PLAN   68 y.o. female with history of cardiomyopathy coronary artery disease, hypertension, cocaine use, history of cardiac arrest in 2010 presents to ED with witnessed cardiac arrest.  Approximate downtime is 5 minutes followed by total CPR time of 40 minutes due to V. fib arrest. Neurology consulted due to concern for stroke on CT Head.   Recommendations CT angiogram and CT perfusion -  negative for LVO, no acute intervention MRI Brain w/o contrast when patient stable-if stroke present then will recommend stroke work-up Avoid bolus dose of heparin if anticoagulation a consideration     Darra Rosa [facility] Pager Number [phone]

## 2018-10-24 NOTE — Procedures (Signed)
ELECTROENCEPHALOGRAM REPORT   Patient: Dawn Cruz       Room #: [facility] EEG No. ID: [id number] Age: 68 y.o.        Sex: female Referring Physician: Mannam Report Date:  10/23/2018        Interpreting Physician: Thana Farr  History: Dawn Cruz is an 68 y.o. female s/p arrest  Medications:  Unasyn, ASA, Nimbex, Insulin, Amiodarone, Fentanyl, Versed, Levophed  Conditions of Recording:  This is a 21 channel routine scalp EEG performed with bipolar and monopolar montages arranged in accordance to the international 10/20 system of electrode placement. One channel was dedicated to EKG recording.  The patient is in the intubated and sedated state.  Description:  The background activity is markedly attenuated with only cardiac artifact noted even with increase in sensitivity to 3uV/mm.  This attenuation is diffuse.  There are rare, short periods (approxiamtely eight per record) of low voltage, generalized, bursts of beta activity lasting up to 2 seconds. No epileptiform activity is noted. Hyperventilation and intermittent photic stimulation were not performed.  IMPRESSION: This is an abnormal electroencephalogram due to severe attenuation with rare generalized bursts of beta activity consistent with the patient's medications.     Thana Farr, MD Neurology [phone] 11/18/2018, 5:41 PM

## 2018-10-24 NOTE — ED Notes (Signed)
Teeth and jewelry given  To son , he is aware that clothes were cut off. Flip flops given to son

## 2018-10-24 NOTE — ED Notes (Signed)
Upon arrival ems handed this RN 4 teeth that had come out with [facility] airway insertion.  Patient had  1-gold colored necklace 1 pr. Gold colored hoop earrings 1 pr. Gold colored studs 1 small gold colored hoop 5-gold colored bracelets  2- gold colored rings with multiple stones

## 2018-10-24 NOTE — Progress Notes (Signed)
Pharmacy Antibiotic Note  Arayla SHAYNIA ALEN is a 68 y.o. female admitted on 11/03/2018 undergoing CPR.  Pharmacy has been consulted for unasyn dosing for aspiration pneumonia. Pt is afebrile and WBC is WNL. SCr is elevated at 1.47.   Plan: Unasyn 3gm IV Q8H F/u renal fxn, C&S, clinical status   Height: 5\' 4"  (162.6 cm) Weight: 150 lb (68 kg) IBW/kg (Calculated) : 54.7  Temp (24hrs), Avg:96.6 F (35.9 C), Min:96.1 F (35.6 C), Max:97.1 F (36.2 C)  Recent Labs  Lab 11/06/2018 1318  WBC 10.3  CREATININE 1.47*  LATICACIDVEN >11.0*    Estimated Creatinine Clearance: 35.7 mL/min (A) (by C-G formula based on SCr of 1.47 mg/dL (H)).    No Known Allergies  Antimicrobials this admission: Unasyn 6/5>> Vanc x 1 6/5  Dose adjustments this admission: N/A  Microbiology results: Pending  Thank you for allowing pharmacy to be a part of this patient's care.  Tuyet Bader, Drake Leach 11/15/2018 2:55 PM

## 2018-10-24 NOTE — Progress Notes (Signed)
Responded to ED page to support family.  Patient experiencing CPR. Patient recovered.  Spoke with son Dorene Sorrow and provided emotional and spiritual support. Son calm and waiting on Doctor to up date him.  Will follow as needed.  Venida Jarvis, [place], [facility], Pager [phone]

## 2018-10-24 NOTE — ED Notes (Signed)
Son Emilly Paper in [place]. Room spoke with him by RN and update given.

## 2018-10-24 NOTE — ED Notes (Signed)
Chaplin at bedside and spoke with Son as well.

## 2018-10-24 NOTE — Progress Notes (Signed)
Spoke with echo tech on call to request stat echo. Jerolyn Shin acknowledged request and will be coming in to perform. Kemani Demarais PA-C

## 2018-10-24 NOTE — Consult Note (Addendum)
Cardiology Consultation:   Patient ID: DIANELLY ALBU; [id number]; 08/13/1950   Admit date: 11/16/2018 Date of Consult: 10/26/2018  Primary Care Provider: Loletta Specter, PA-C Primary Cardiologist: New to [facility]; has not been seen since admission in 2013 Primary Electrophysiologist:  None    Patient Profile:   Dawn Cruz is a 68 y.o. female with a PMH of VF arrest in 2013, cardiomyopathy with EF 10-20%, HTN, HLD, MR, tobacco abuse, cocaine abuse, who is being seen today for the evaluation of cardiac arrest at the request of Dr. Adela Lank.  History of Present Illness:   Dawn Cruz was in her usual state of health until she she experienced a witnessed collapse in the early afternoon of 10/31/2018. 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 A coworker initiated CPR prior to EMS arrival. EMS arrived ~5 minutes after collapse and ACLS was started. Patient was reported to be in VF. She was treated with multiple rounds of amiodarone and norepinephrine infusions with multiple defibrillations. Patient intubated in the ED. ROSC achieved and patient was admitted to PCCM with initiation of hypothermia protocol.   She was last seen by cardiology during an admission for VF arrest in 2013. Echo that admission revealed EF 10-20% with diffuse hypokinesis. She underwent a NST which was without inducible ischemia. She was subsequently lost to follow-up with cardiology. She follows at [facility] and was last seen 05/2018. She was reportedly without complaints at that visit and was there for follow-up of her HTN. BP elevated at that time and she was not taking previously prescribed HCTZ. This was restarted in addition to amlodpine and metoprolol succinate.   Patient is intubated and sedated at the time of this evaluation and unable to participate in history.   She was hypotensive on arrival, improved with pressors, tachycardic, O2 sats 100% on  a ventilator, hypothermic. Labs notable for K 3.0, Cr 1.47 (baseline 0.9), LFTs mildly elevated, LDH 388, lactate >11, Hgb 12.9, PLT 157, DDimer >20, glucose 523, COVID 19 negative. EKG with sinus rhythm with LBBB and PAC. CXR with RUL infiltrate c/f PNA vs aspiration.   Past Medical History:  Diagnosis Date  . Acute MI (HCC)   . Cardiomyopathy, severe by echo with EF 10-20% 09/11/2011  . MR (mitral regurgitation), moderate 09/11/2011    No past surgical history on file.   Home Medications:  Prior to Admission medications   Medication Sig Start Date End Date Taking? Authorizing Provider  aspirin 81 MG tablet Take 1 tablet (81 mg total) by mouth daily. 02/13/17  Yes Loletta Specter, PA-C  amLODipine (NORVASC) 5 MG tablet Take 1 tablet (5 mg total) by mouth daily. 05/22/18   Loletta Specter, PA-C  atorvastatin (LIPITOR) 40 MG tablet Take 2 tablets (80 mg total) by mouth daily. 05/28/18   Loletta Specter, PA-C  fluticasone [facility]) 50 MCG/ACT nasal spray Place 2 sprays into both nostrils daily. 07/01/18   Cathie Hoops, Amy V, PA-C  hydrochlorothiazide (HYDRODIURIL) 25 MG tablet Take 1 tablet (25 mg total) by mouth daily. 05/22/18   Loletta Specter, PA-C  ipratropium (ATROVENT) 0.06 % nasal spray Place 2 sprays into both nostrils 4 (four) times daily. 07/01/18   Cathie Hoops, Amy V, PA-C  metoprolol succinate (TOPROL XL) 25 MG 24 hr tablet Take 1 tablet (25 mg total) by mouth daily. 05/22/18   Loletta Specter, PA-C  ondansetron (ZOFRAN ODT) 4 MG disintegrating tablet Take 1 tablet (  4 mg total) by mouth every 8 (eight) hours as needed for nausea or vomiting. 07/01/18   Cathie Hoops, Amy V, PA-C  PAZEO 0.7 % SOLN Place 0.7 Bottles into both eyes every morning. 10/12/17   [provider]  varenicline (CHANTIX STARTING MONTH PAK) 0.5 MG X 11 & 1 MG X 42 tablet Take one 0.5 mg tablet by mouth once daily for 3 days, then increase to one 0.5 mg tablet twice daily for 4 days, then increase to one 1 mg tablet twice daily.  05/22/18   Loletta Specter, PA-C    Inpatient Medications: Scheduled Meds: . artificial tears  1 application Both Eyes Q8H  . aspirin  300 mg Rectal NOW  . cisatracurium  0.1 mg/kg Intravenous Once  . famotidine  20 mg Per Tube BID  . heparin  5,000 Units Subcutaneous Q8H  . insulin aspart  0-15 Units Subcutaneous Q4H   Continuous Infusions: . sodium chloride    . amiodarone    . amiodarone    . ampicillin-sulbactam (UNASYN) IV    . cisatracurium (NIMBEX) infusion 1 mcg/kg/min (07-Nov-2018 1534)  . fentaNYL infusion INTRAVENOUS 100 mcg/hr (2018-11-07 1535)  . midazolam 2 mg/hr (07-Nov-2018 1534)  . norepinephrine (LEVOPHED) Adult infusion    . potassium chloride     PRN Meds:   Allergies:   No Known Allergies  Social History:   Social History   Socioeconomic History  . Marital status: Divorced    Spouse name: Not on file  . Number of children: Not on file  . Years of education: Not on file  . Highest education level: Not on file  Occupational History  . Not on file  Social Needs  . Financial resource strain: Not on file  . Food insecurity:    Worry: Not on file    Inability: Not on file  . Transportation needs:    Medical: Not on file    Non-medical: Not on file  Tobacco Use  . Smoking status: Current Some Day Smoker    Packs/day: 0.25    Years: 15.00    Pack years: 3.75    Types: Cigarettes  . Smokeless tobacco: Never Used  Substance and Sexual Activity  . Alcohol use: Yes    Alcohol/week: 9.0 standard drinks    Types: 9 Standard drinks or equivalent per week  . Drug use: Yes    Frequency: 1.0 times per week    Types: Cocaine  . Sexual activity: Not Currently    Birth control/protection: Post-menopausal  Lifestyle  . Physical activity:    Days per week: Not on file    Minutes per session: Not on file  . Stress: Not on file  Relationships  . Social connections:    Talks on phone: Not on file    Gets together: Not on file    Attends religious service:  Not on file    Active member of club or organization: Not on file    Attends meetings of clubs or organizations: Not on file    Relationship status: Not on file  . Intimate partner violence:    Fear of current or ex partner: Not on file    Emotionally abused: Not on file    Physically abused: Not on file    Forced sexual activity: Not on file  Other Topics Concern  . Not on file  Social History Narrative  . Not on file    Family History:   No family history on file.  ROS:  Please see the history of present illness.   All other ROS reviewed and negative.     Physical Exam/Data:   Vitals:   2018-11-14 1400 11/14/18 1415 Nov 14, 2018 1430 2018/11/14 1445  BP: 116/85 110/77 111/82 120/86  Pulse: (!) 119 (!) 117 (!) 117 (!) 123  Resp: (!) 32 (!) 35 (!) 31 (!) 33  Temp:  (!) 96.1 F (35.6 C) (!) 97.1 F (36.2 C) (!) 97.4 F (36.3 C)  SpO2: 100% 97% 97% 100%  Weight:      Height:       No intake or output data in the 24 hours ending 11/14/18 1616 Filed Weights   11/14/2018 1253  Weight: 68 kg   Body mass index is 25.75 kg/m.  General:  Intubated and sedated HEENT: sclera anicteric, mouth bloody multiple fractured teeth  Vascular: No carotid bruits; distal pulses 2+ bilaterally, cool extremities Cardiac:  normal S1, S2; tachycardic, regular rhythm; no murmurs, gallops, or rubs appreciated Lungs:  clear to auscultation bilaterally anteriorly, no wheezing, rhonchi or rales  Abd: NABS, soft, nontender, no hepatomegaly Ext: no edema Musculoskeletal:  No deformities, BUE and BLE strength normal and equal Skin: warm and dry  Neuro:  CNs 2-12 intact, no focal abnormalities noted Psych:  Normal affect   EKG:  The EKG was personally reviewed and demonstrates:  sinus rhythm with LBBB and PAC Telemetry:  Telemetry was personally reviewed and demonstrates:  Sinus tachycardia  Relevant CV Studies: Echocardiogram 2013: 2D-Echo Study Conclusions  - Left ventricle: The cavity size was  moderately dilated. There was moderate Eccentric Hypertrophy. Systolic function was severely reduced. The estimated ejection fraction was in the range of 10% to 20%. Severe diffuse hypokinesis. Although no diagnostic regional wall motion abnormality was identified, this possibility cannot be completely excluded on the basis of this study. Doppler parameters are consistent with elevated mean left atrial filling pressure - in part due to MR. - Mitral valve: Moderate regurgitation with a central jet. - Left atrium: The atrium was moderately dilated. - Right ventricle: Systolic function was mildly reduced.  Impressions:  - Study suggests left ventricular dysfunction without congestive heart failure, with elevated left-sided pressure and severely reduced cardiac output. The dysfunction is both systolic and diastolic.  NST 2013: IMPRESSION:  No evidence for inducible ischemia.  LVEF of 28%.  Laboratory Data:  Chemistry Recent Labs  Lab 2018/11/14 1318 2018/11/14 1430  NA 135 133*  K 3.0* 3.3*  CL 97*  --   CO2 12*  --   GLUCOSE 523*  --   BUN 12  --   CREATININE 1.47*  --   CALCIUM 11.0*  --   GFRNONAA 37*  --   GFRAA 43*  --   ANIONGAP 26*  --     Recent Labs  Lab 2018-11-14 1318  PROT 5.9*  ALBUMIN 2.7*  AST 127*  ALT 56*  ALKPHOS 88  BILITOT 0.8   Hematology Recent Labs  Lab 2018-11-14 1318 14-Nov-2018 1430  WBC 10.3  --   RBC 3.49*  --   HGB 12.9 14.6  HCT 41.3 43.0  MCV 118.3*  --   MCH 37.0*  --   MCHC 31.2  --   RDW 12.5  --   PLT 157  --    Cardiac EnzymesNo results for input(s): TROPONINI in the last 168 hours. No results for input(s): TROPIPOC in the last 168 hours.  BNPNo results for input(s): BNP, PROBNP in the last 168 hours.  DDimer  Recent Labs  Lab 11/12/2018 1318  DDIMER >20.00*    Radiology/Studies:  Dg Chest Portable 1 View  Result Date: 10/21/2018 CLINICAL DATA:  Intubation, CPR EXAM: PORTABLE CHEST 1 VIEW COMPARISON:  Portable exam 1300 hours  compared to 09/17/2011 FINDINGS: Tip of endotracheal tube projects 1.9 cm above carina. Nasogastric tube extends into stomach. Upper normal heart size. RIGHT upper lobe infiltrate which could represent pneumonia or aspiration. Remaining lungs grossly clear. No pleural effusion or pneumothorax. IMPRESSION: RIGHT upper lobe infiltrate question pneumonia versus aspiration. Electronically Signed   By: Ulyses Southward M.D.   On: 11/03/2018 13:12    Assessment and Plan:   1. 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 She had an echo in 2013 with EF 10-20% and was subsequently lost to cardiology follow-up. Early this afternoon she had a witnessed collapse and was found to be in VF on EMS arrival. ROSC achieved after several rounds of epi, amiodarone, and defibrillations. Currently intubated and on pressors, maintaining sinus rhythm with LBBB on amiodarone gtt. Hypothermic protocol underway - Continue amiodarone gtt - Repeat echocardiogram  - Can consider ischemic evaluation after completion of hypothermic protocol if meaningful recovery made.  2. 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 NST without ischemia in 2013. Subsequently lost to cardiology follow-up.  - Discontinue amlodipine - Home metoprolol on hold given hypotension - Further recommendations pending echo repeat and recovery from arrest.  3. HTN: hypotensive this admission, responsive to pressors. Poorly controlled HTN in the outpatient setting. Home meds on hold (amlodipine, Metoprolol, and HCTZ) - Continue to hold home medications - Discontinue amlodipine given LV systolic dysfunction.   4. HLD: LDL 144 05/2018. LFTs mildly elevated - Continue to hold atorvastatin  5. History of cocaine abuse: Utox ordered  6. MR: moderate on echo in 2013 - Will follow-up repeat echo  7. Hypokalemia: K 3.0 on admission. Repletion ordered - Continue to monitor to maintain K >4     For  questions or updates, please contact [facility] Please consult [email or web address] for contact info under Cardiology/STEMI.   Signed, Beatriz Stallion, PA-C  11/04/2018 4:16 PM [phone]   History and all data above reviewed.  Patient examined.   Unfortunate situation.  The patient presents after Vfib arrest and is now intubated and cooled.  I reviewed the records from 2013 and she had this same presentation.  Intubated, sedated.   I agree with the findings as above.  The patient exam reveals COR:RRR  ,  Lungs: Decreased breath sounds but no crackles.   ,  Abd: Absent bowel sounds, Ext 2 + distal pulses  .  All available labs, radiology testing, previous records reviewed. Agree with documented assessment and plan.   VFIB arrest:  Continue amiodarone.  Supportive care and further evaluation pending her response to supportive care over the next couple of days.  Echo ordered but she has a know severely reduced EF without medication adherence.  Unable to titrate any meds at this point.   Norepi for BP support.   Of note this was thought to be a non ischemic etiology in the past and we will consider further ischemia work up but this would likely be a primary arrhythmic event.    Fayrene Fearing Loryn Haacke  5:11 PM  10/30/2018

## 2018-10-24 NOTE — Progress Notes (Signed)
  Echocardiogram 2D Echocardiogram has been performed.  Gerda Diss 10/22/2018, 7:54 PM

## 2018-10-24 NOTE — H&P (Signed)
NAME:  Dawn BraceDelores M Cruz, MRN:  [id number], DOB:  [birth year], LOS: 0 ADMISSION DATE:  10/23/2018, CONSULTATION DATE:  6/5  REFERRING MD: Dr. Adela LankFloyd EDP, CHIEF COMPLAINT:   Cardiac arrest  Brief History   68 year old female witnessed VF arrest with quick bystander CPR. 30-45 minute duration off and on.   History of present illness   68 year old female with prior history of prior cardiac arrest, MI, and HFrEF 10% in 2013. No other history available in  EMR. She is supposed to be on HCTZ, Norvasc, and toprol, however she last filled these 4 months ago for 90 days worth. Her PCP felt as though she had not been compliant. She smokes 3 cigarettes per day and drinks two fifths of liquor per week. She was at work in her usual state of health when she collapsed in the early PM hours of 6/5. Collapse was witnessed and EMS was called right away. Reports of bystander CPR. EMS responded in approximately 5 mins. Initial rhythm VF. Patient underwent ACLS with defibrillation for entire trip to ED. Continued to be in VF for first 30 mins in ED with brief periods of ROSC. She was intubated, CVL placed, and treated with amiodarone and norepinephrine infusions in addition to ACLS with multiple defibrillations. Once ROSC achieved with relative stability, PCCM has been asked to admit.  Past Medical History   has a past medical history of Acute MI (HCC), Cardiomyopathy, severe by echo with EF 10-20% (09/11/2011), and MR (mitral regurgitation), moderate (09/11/2011).   Significant Hospital Events   6/5 admit for cardiac arrest  Consults:  Cardiology  Procedures:  ETT 6/5 > CVL 6/5 >  Significant Diagnostic Tests:  CT head 6/5 > Echo 6/5 > EEG 6/5 >  Micro Data:  COVID-19 5/6 > negative  Blood Cx 6/5 > Urine 6/5 > Tracheal aspirate 6/5 >  Antimicrobials:  Unasyn 6/5 >  Interim history/subjective:    Objective   Blood pressure (!) 81/66, pulse 84, resp. rate 19, height 5\' 4"  (1.626 m), weight 68 kg,  SpO2 92 %.    Vent Mode: PRVC FiO2 (%):  [100 %] 100 % Set Rate:  [18 bmp] 18 bmp Vt Set:  [440 mL] 440 mL PEEP:  [5 cmH20] 5 cmH20  No intake or output data in the 24 hours ending 10/25/2018 1414 Filed Weights   11/10/2018 1253  Weight: 68 kg    Examination: General: Middle aged female on vent HENT: ETT, bloody mouth with fractured upper incisors. PERRL, no appreciable JVD  Lungs: Coarse bilateral Cardiovascular: Tachy, regular, no MRG Abdomen: Soft, non- distended Extremities: No acute deformity Neuro: unresponsive. Moving R upper extremity.  GU: Foley  Resolved Hospital Problem list     Assessment & Plan:   Cardiac arrest: VF arrest, prolonged downtime.  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 RASS -5 VAP protocol (if indicated): Per protocl DVT prophylaxis: SQH GI prophylaxis: pepcid Glucose control: SSI Mobility: BR Code Status: FULL Family Communication: Son Dorene Sorrow updated Disposition: ICU  Labs   CBC: Recent Labs  Lab 10/25/2018 1318  WBC 10.3  NEUTROABS 3.5  HGB 12.9  HCT 41.3  MCV 118.3*  PLT 157    Basic Metabolic Panel: Recent Labs  Lab 10/25/2018 1318  NA 135  K 3.0*  CL 97*  CO2 12*  GLUCOSE 523*  BUN 12  CREATININE 1.47*  CALCIUM 11.0*   GFR: Estimated  Creatinine Clearance: 35.7 mL/min (A) (by C-G formula based on SCr of 1.47 mg/dL (H)). Recent Labs  Lab 11/04/2018 1318  WBC 10.3    Liver Function Tests: Recent Labs  Lab 10/22/2018 1318  AST 127*  ALT 56*  ALKPHOS 88  BILITOT 0.8  PROT 5.9*  ALBUMIN 2.7*   No results for input(s): LIPASE, AMYLASE in the last 168 hours. No results for input(s): AMMONIA in the last 168 hours.  ABG    Component Value Date/Time   PHART 7.492 (H) 09/15/2011 0446   PCO2ART 43.8 09/15/2011 0446   PO2ART 71.0 (L) 09/15/2011 0446   HCO3 33.5 (H) 09/15/2011 0446   TCO2 35 09/15/2011 0446   ACIDBASEDEF 1.7 09/12/2011 0500   O2SAT 95.0 09/15/2011 0446     Coagulation Profile: No results for input(s): INR, PROTIME in the last 168 hours.  Cardiac Enzymes: No results for input(s): CKTOTAL, CKMB, CKMBINDEX, TROPONINI in the last 168 hours.  HbA1C: Hemoglobin A1C  Date/Time Value Ref Range Status  02/13/2017 03:18 PM 5.0  Final  08/11/2013 10:38 AM 5.5  Final    CBG: No results for input(s): GLUCAP in the last 168 hours.  Review of Systems:   Unable: intubated  Past Medical History  She,  has a past medical history of Acute MI (HCC), Cardiomyopathy, severe by echo with EF 10-20% (09/11/2011), and MR (mitral regurgitation), moderate (09/11/2011).   Surgical History   No past surgical history on file.   Social History   reports that she has been smoking cigarettes. She has a 3.75 pack-year smoking history. She has never used smokeless tobacco. She reports current alcohol use of about 9.0 standard drinks of alcohol per week. She reports current drug use. Frequency: 1.00 time per week. Drug: Cocaine.   Family History   Her family history is not on file.   Allergies No Known Allergies   Home Medications  Prior to Admission medications   Medication Sig Start Date End Date Taking? Authorizing Provider  amLODipine (NORVASC) 5 MG tablet Take 1 tablet (5 mg total) by mouth daily. 05/22/18    Loletta Specter, PA-C  aspirin 81 MG tablet Take 1 tablet (81 mg total) by mouth daily. 02/13/17   Loletta Specter, PA-C  atorvastatin (LIPITOR) 40 MG tablet Take 2 tablets (80 mg total) by mouth daily. 05/28/18   Loletta Specter, PA-C  fluticasone [facility]) 50 MCG/ACT nasal spray Place 2 sprays into both nostrils daily. 07/01/18   Cathie Hoops, Amy V, PA-C  hydrochlorothiazide (HYDRODIURIL) 25 MG tablet Take 1 tablet (25 mg total) by mouth daily. 05/22/18   Loletta Specter, PA-C  ipratropium (ATROVENT) 0.06 % nasal spray Place 2 sprays into both nostrils 4 (four) times daily. 07/01/18   Cathie Hoops, Amy V, PA-C  metoprolol succinate (TOPROL XL) 25 MG 24 hr tablet Take 1 tablet (25 mg total)  by mouth daily. 05/22/18   Loletta Specter, PA-C  ondansetron (ZOFRAN ODT) 4 MG disintegrating tablet Take 1 tablet (4 mg total) by mouth every 8 (eight) hours as needed for nausea or vomiting. 07/01/18   Cathie Hoops, Amy V, PA-C  PAZEO 0.7 % SOLN Place 0.7 Bottles into both eyes every morning. 10/12/17   [provider]  varenicline (CHANTIX STARTING MONTH PAK) 0.5 MG X 11 & 1 MG X 42 tablet Take one 0.5 mg tablet by mouth once daily for 3 days, then increase to one 0.5 mg tablet twice daily for 4 days, then increase to one 1 mg tablet twice daily. 05/22/18   Loletta Specter, PA-C     Critical care time: 45 mins     Joneen Roach, AGACNP-BC [facility] Pulmonary/Critical Care Pager [phone] or [phone]  11/14/2018 3:40 PM

## 2018-10-24 NOTE — ED Notes (Signed)
Son at bedside.

## 2018-10-24 NOTE — Progress Notes (Signed)
EEG complete - results pending 

## 2018-10-24 NOTE — Progress Notes (Signed)
Patient transported from ED to room 2H05 without complications.

## 2018-10-24 NOTE — Progress Notes (Addendum)
PCCM Progress note  CT head reviewed with findings suggestive of large MCA stroke. Suspect large CVA may be the cause of the V fib arrest.  Consulted Dr Aroor neurology and discussed case We will get emergent CTA of head and neck to evaluate large vessel occlusion If LVO present then we will need to discuss with neuro IR about options for revascularization If she does get revascularized we will need to stop the hypothermia protocol  I also asked Dr. Antoine Poche, cardiology to get a stat echocardiogram to reevaluate the LV function in preparation for neuro IR procedure.  The patient is critically ill with multiple organ system failure and requires high complexity decision making for assessment and support, frequent evaluation and titration of therapies, advanced monitoring, review of radiographic studies and interpretation of complex data.   Critical Care Time devoted to patient care services, exclusive of separately billable procedures, described in this note is 40 minutes.   Chilton Greathouse MD [place] [facility] Pager [phone] If no answer call [phone] 11/17/2018, 6:22 PM   Addendum: Reviewed CTA with neurology. No evidence of LVO. Continue hypothermia protocol Will need MRI at some point.

## 2018-10-24 NOTE — ED Notes (Signed)
Pt son Dorene Sorrow [phone]

## 2018-10-24 NOTE — ED Notes (Signed)
This RN just spoke with Pt's son  Kathaleen Bury [phone]  Pt's son would like an update once the Pt is settled in her IP Bed.

## 2018-10-25 DIAGNOSIS — N179 Acute kidney failure, unspecified: Secondary | ICD-10-CM

## 2018-10-25 DIAGNOSIS — I5042 Chronic combined systolic (congestive) and diastolic (congestive) heart failure: Secondary | ICD-10-CM

## 2018-10-25 DIAGNOSIS — R570 Cardiogenic shock: Secondary | ICD-10-CM

## 2018-10-25 DIAGNOSIS — Z9911 Dependence on respirator [ventilator] status: Secondary | ICD-10-CM

## 2018-10-25 DIAGNOSIS — J9602 Acute respiratory failure with hypercapnia: Secondary | ICD-10-CM

## 2018-10-25 DIAGNOSIS — R579 Shock, unspecified: Secondary | ICD-10-CM

## 2018-10-25 LAB — BASIC METABOLIC PANEL
Anion gap: 14 (ref 5–15)
Anion gap: 16 — ABNORMAL HIGH (ref 5–15)
Anion gap: 15 (ref 5–15)
Anion gap: 15 (ref 5–15)
Anion gap: 13 (ref 5–15)
Anion gap: 13 (ref 5–15)
Anion gap: 8 (ref 5–15)
Anion gap: 12 (ref 5–15)
Anion gap: 16 — ABNORMAL HIGH (ref 5–15)
BUN: 20 mg/dL (ref 8–23)
BUN: 23 mg/dL (ref 8–23)
BUN: 23 mg/dL (ref 8–23)
BUN: 25 mg/dL — ABNORMAL HIGH (ref 8–23)
BUN: 25 mg/dL — ABNORMAL HIGH (ref 8–23)
BUN: 27 mg/dL — ABNORMAL HIGH (ref 8–23)
BUN: 28 mg/dL — ABNORMAL HIGH (ref 8–23)
BUN: 27 mg/dL — ABNORMAL HIGH (ref 8–23)
BUN: 29 mg/dL — ABNORMAL HIGH (ref 8–23)
CO2: 19 mmol/L — ABNORMAL LOW (ref 22–32)
CO2: 15 mmol/L — ABNORMAL LOW (ref 22–32)
CO2: 16 mmol/L — ABNORMAL LOW (ref 22–32)
CO2: 13 mmol/L — ABNORMAL LOW (ref 22–32)
CO2: 13 mmol/L — ABNORMAL LOW (ref 22–32)
CO2: 15 mmol/L — ABNORMAL LOW (ref 22–32)
CO2: 16 mmol/L — ABNORMAL LOW (ref 22–32)
CO2: 17 mmol/L — ABNORMAL LOW (ref 22–32)
CO2: 14 mmol/L — ABNORMAL LOW (ref 22–32)
Calcium: 8.9 mg/dL (ref 8.9–10.3)
Calcium: 8.3 mg/dL — ABNORMAL LOW (ref 8.9–10.3)
Calcium: 8 mg/dL — ABNORMAL LOW (ref 8.9–10.3)
Calcium: 7.9 mg/dL — ABNORMAL LOW (ref 8.9–10.3)
Calcium: 7.8 mg/dL — ABNORMAL LOW (ref 8.9–10.3)
Calcium: 7.8 mg/dL — ABNORMAL LOW (ref 8.9–10.3)
Calcium: 7.5 mg/dL — ABNORMAL LOW (ref 8.9–10.3)
Calcium: 7.6 mg/dL — ABNORMAL LOW (ref 8.9–10.3)
Calcium: 7.7 mg/dL — ABNORMAL LOW (ref 8.9–10.3)
Chloride: 102 mmol/L (ref 98–111)
Chloride: 108 mmol/L (ref 98–111)
Chloride: 106 mmol/L (ref 98–111)
Chloride: 108 mmol/L (ref 98–111)
Chloride: 111 mmol/L (ref 98–111)
Chloride: 110 mmol/L (ref 98–111)
Chloride: 110 mmol/L (ref 98–111)
Chloride: 107 mmol/L (ref 98–111)
Chloride: 106 mmol/L (ref 98–111)
Creatinine, Ser: 1.81 mg/dL — ABNORMAL HIGH (ref 0.44–1.00)
Creatinine, Ser: 2.07 mg/dL — ABNORMAL HIGH (ref 0.44–1.00)
Creatinine, Ser: 2.43 mg/dL — ABNORMAL HIGH (ref 0.44–1.00)
Creatinine, Ser: 2.46 mg/dL — ABNORMAL HIGH (ref 0.44–1.00)
Creatinine, Ser: 2.42 mg/dL — ABNORMAL HIGH (ref 0.44–1.00)
Creatinine, Ser: 2.59 mg/dL — ABNORMAL HIGH (ref 0.44–1.00)
Creatinine, Ser: 2.81 mg/dL — ABNORMAL HIGH (ref 0.44–1.00)
Creatinine, Ser: 3.04 mg/dL — ABNORMAL HIGH (ref 0.44–1.00)
Creatinine, Ser: 3.17 mg/dL — ABNORMAL HIGH (ref 0.44–1.00)
GFR calc Af Amer: 33 mL/min — ABNORMAL LOW (ref >60)
GFR calc Af Amer: 28 mL/min — ABNORMAL LOW (ref >60)
GFR calc Af Amer: 23 mL/min — ABNORMAL LOW (ref >60)
GFR calc Af Amer: 23 mL/min — ABNORMAL LOW (ref >60)
GFR calc Af Amer: 23 mL/min — ABNORMAL LOW (ref >60)
GFR calc Af Amer: 22 mL/min — ABNORMAL LOW (ref >60)
GFR calc Af Amer: 19 mL/min — ABNORMAL LOW (ref >60)
GFR calc Af Amer: 18 mL/min — ABNORMAL LOW (ref >60)
GFR calc Af Amer: 17 mL/min — ABNORMAL LOW (ref >60)
GFR calc non Af Amer: 29 mL/min — ABNORMAL LOW (ref >60)
GFR calc non Af Amer: 24 mL/min — ABNORMAL LOW (ref >60)
GFR calc non Af Amer: 20 mL/min — ABNORMAL LOW (ref >60)
GFR calc non Af Amer: 20 mL/min — ABNORMAL LOW (ref >60)
GFR calc non Af Amer: 20 mL/min — ABNORMAL LOW (ref >60)
GFR calc non Af Amer: 19 mL/min — ABNORMAL LOW (ref >60)
GFR calc non Af Amer: 17 mL/min — ABNORMAL LOW (ref >60)
GFR calc non Af Amer: 15 mL/min — ABNORMAL LOW (ref >60)
GFR calc non Af Amer: 15 mL/min — ABNORMAL LOW (ref >60)
Glucose, Bld: 240 mg/dL — ABNORMAL HIGH (ref 70–99)
Glucose, Bld: 175 mg/dL — ABNORMAL HIGH (ref 70–99)
Glucose, Bld: 240 mg/dL — ABNORMAL HIGH (ref 70–99)
Glucose, Bld: 224 mg/dL — ABNORMAL HIGH (ref 70–99)
Glucose, Bld: 199 mg/dL — ABNORMAL HIGH (ref 70–99)
Glucose, Bld: 161 mg/dL — ABNORMAL HIGH (ref 70–99)
Glucose, Bld: 134 mg/dL — ABNORMAL HIGH (ref 70–99)
Glucose, Bld: 166 mg/dL — ABNORMAL HIGH (ref 70–99)
Glucose, Bld: 171 mg/dL — ABNORMAL HIGH (ref 70–99)
Potassium: 2.4 mmol/L — CL (ref 3.5–5.1)
Potassium: 3.4 mmol/L — ABNORMAL LOW (ref 3.5–5.1)
Potassium: 6 mmol/L — ABNORMAL HIGH (ref 3.5–5.1)
Potassium: 4.2 mmol/L (ref 3.5–5.1)
Potassium: 4 mmol/L (ref 3.5–5.1)
Potassium: 5.5 mmol/L — ABNORMAL HIGH (ref 3.5–5.1)
Potassium: 5.5 mmol/L — ABNORMAL HIGH (ref 3.5–5.1)
Potassium: 4.8 mmol/L (ref 3.5–5.1)
Potassium: 6.4 mmol/L (ref 3.5–5.1)
Sodium: 135 mmol/L (ref 135–145)
Sodium: 139 mmol/L (ref 135–145)
Sodium: 137 mmol/L (ref 135–145)
Sodium: 136 mmol/L (ref 135–145)
Sodium: 137 mmol/L (ref 135–145)
Sodium: 138 mmol/L (ref 135–145)
Sodium: 134 mmol/L — ABNORMAL LOW (ref 135–145)
Sodium: 136 mmol/L (ref 135–145)
Sodium: 136 mmol/L (ref 135–145)

## 2018-10-25 LAB — GLUCOSE, CAPILLARY
Glucose-Capillary: 132 mg/dL — ABNORMAL HIGH (ref 70–99)
Glucose-Capillary: 151 mg/dL — ABNORMAL HIGH (ref 70–99)
Glucose-Capillary: 154 mg/dL — ABNORMAL HIGH (ref 70–99)
Glucose-Capillary: 161 mg/dL — ABNORMAL HIGH (ref 70–99)
Glucose-Capillary: 166 mg/dL — ABNORMAL HIGH (ref 70–99)
Glucose-Capillary: 170 mg/dL — ABNORMAL HIGH (ref 70–99)
Glucose-Capillary: 173 mg/dL — ABNORMAL HIGH (ref 70–99)
Glucose-Capillary: 179 mg/dL — ABNORMAL HIGH (ref 70–99)
Glucose-Capillary: 194 mg/dL — ABNORMAL HIGH (ref 70–99)
Glucose-Capillary: 232 mg/dL — ABNORMAL HIGH (ref 70–99)

## 2018-10-25 LAB — POCT I-STAT, CHEM 8
BUN: 23 mg/dL (ref 8–23)
Calcium, Ion: 1.24 mmol/L (ref 1.15–1.40)
Chloride: 106 mmol/L (ref 98–111)
Creatinine, Ser: 1.8 mg/dL — ABNORMAL HIGH (ref 0.44–1.00)
Glucose, Bld: 150 mg/dL — ABNORMAL HIGH (ref 70–99)
HCT: 39 % (ref 36.0–46.0)
Hemoglobin: 13.3 g/dL (ref 12.0–15.0)
Potassium: 3.2 mmol/L — ABNORMAL LOW (ref 3.5–5.1)
Sodium: 138 mmol/L (ref 135–145)
TCO2: 17 mmol/L — ABNORMAL LOW (ref 22–32)

## 2018-10-25 LAB — CBC
HCT: 38.5 % (ref 36.0–46.0)
Hemoglobin: 12.8 g/dL (ref 12.0–15.0)
MCH: 36.2 pg — ABNORMAL HIGH (ref 26.0–34.0)
MCHC: 33.2 g/dL (ref 30.0–36.0)
MCV: 108.8 fL — ABNORMAL HIGH (ref 80.0–100.0)
Platelets: 209 10*3/uL (ref 150–400)
RBC: 3.54 MIL/uL — ABNORMAL LOW (ref 3.87–5.11)
RDW: 12.3 % (ref 11.5–15.5)
WBC: 20.7 10*3/uL — ABNORMAL HIGH (ref 4.0–10.5)
nRBC: 0.1 % (ref 0.0–0.2)

## 2018-10-25 LAB — POTASSIUM: Potassium: 4.9 mmol/L (ref 3.5–5.1)

## 2018-10-25 LAB — PROTIME-INR
INR: 1.2 (ref 0.8–1.2)
Prothrombin Time: 15.5 seconds — ABNORMAL HIGH (ref 11.4–15.2)

## 2018-10-25 LAB — POCT I-STAT 7, (LYTES, BLD GAS, ICA,H+H)
Acid-base deficit: 12 mmol/L — ABNORMAL HIGH (ref 0.0–2.0)
Bicarbonate: 14.5 mmol/L — ABNORMAL LOW (ref 20.0–28.0)
Calcium, Ion: 1.21 mmol/L (ref 1.15–1.40)
HCT: 36 % (ref 36.0–46.0)
Hemoglobin: 12.2 g/dL (ref 12.0–15.0)
O2 Saturation: 97 %
Patient temperature: 30.9
Potassium: 3.4 mmol/L — ABNORMAL LOW (ref 3.5–5.1)
Sodium: 138 mmol/L (ref 135–145)
TCO2: 16 mmol/L — ABNORMAL LOW (ref 22–32)
pCO2 arterial: 26.9 mmHg — ABNORMAL LOW (ref 32.0–48.0)
pH, Arterial: 7.304 — ABNORMAL LOW (ref 7.350–7.450)
pO2, Arterial: 73 mmHg — ABNORMAL LOW (ref 83.0–108.0)

## 2018-10-25 LAB — TROPONIN I
Troponin I: 28.47 ng/mL (ref <0.03)
Troponin I: 22.82 ng/mL (ref <0.03)

## 2018-10-25 LAB — PROCALCITONIN: Procalcitonin: 84.7 ng/mL

## 2018-10-25 LAB — LEGIONELLA PNEUMOPHILA SEROGP 1 UR AG: L. pneumophila Serogp 1 Ur Ag: NEGATIVE

## 2018-10-25 LAB — MAGNESIUM: Magnesium: 1.8 mg/dL (ref 1.7–2.4)

## 2018-10-25 LAB — PHOSPHORUS: Phosphorus: 3 mg/dL (ref 2.5–4.6)

## 2018-10-25 LAB — LACTIC ACID, PLASMA: Lactic Acid, Venous: 6.3 mmol/L (ref 0.5–1.9)

## 2018-10-25 LAB — APTT: aPTT: 39 seconds — ABNORMAL HIGH (ref 24–36)

## 2018-10-25 MED ORDER — SODIUM CHLORIDE 0.9 % IV SOLN
3.0000 g | Freq: Two times a day (BID) | INTRAVENOUS | Status: DC
  Administered 2018-10-25 – 2018-10-27 (×4): 3 g via INTRAVENOUS
  Filled 2018-10-25 (×5): qty 3

## 2018-10-25 MED ORDER — POTASSIUM CHLORIDE 20 MEQ/15ML (10%) PO SOLN
40.0000 meq | Freq: Once | ORAL | Status: AC
  Administered 2018-10-25: 06:00:00 40 meq
  Filled 2018-10-25: qty 30

## 2018-10-25 MED ORDER — VASOPRESSIN 20 UNIT/ML IV SOLN
0.0400 [IU]/min | INTRAVENOUS | Status: DC
  Administered 2018-10-25: 0.03 [IU]/min via INTRAVENOUS
  Administered 2018-10-25 – 2018-10-26 (×2): 0.04 [IU]/min via INTRAVENOUS
  Filled 2018-10-25 (×3): qty 2

## 2018-10-25 MED ORDER — NOREPINEPHRINE 16 MG/250ML-% IV SOLN
0.0000 ug/min | INTRAVENOUS | Status: DC
  Administered 2018-10-25: 04:00:00 30 ug/min via INTRAVENOUS
  Administered 2018-10-25: 40 ug/min via INTRAVENOUS
  Administered 2018-10-26: 24 ug/min via INTRAVENOUS
  Administered 2018-10-27: 10 ug/min via INTRAVENOUS
  Filled 2018-10-25 (×4): qty 250

## 2018-10-25 MED ORDER — EPINEPHRINE PF 1 MG/ML IJ SOLN
0.5000 ug/min | INTRAVENOUS | Status: DC
  Administered 2018-10-25: 08:00:00 0.5 ug/min via INTRAVENOUS
  Filled 2018-10-25: qty 4

## 2018-10-25 NOTE — Progress Notes (Signed)
NAME:  Dawn Cruz, MRN:  [id number], DOB:  Sep 30, 1950, LOS: 1 ADMISSION DATE:  10/22/2018, CONSULTATION DATE:  6/5  REFERRING MD: Dr. Tyrone Nine EDP, CHIEF COMPLAINT:   Cardiac arrest  Brief History   68 year old female witnessed VF arrest with quick bystander CPR. 30-45 minute duration off and on.   History of present illness   68 year old female with prior history of prior cardiac arrest, MI, and HFrEF 10% in 2013. No other history available in  EMR. She is supposed to be on HCTZ, Norvasc, and toprol, however she last filled these 4 months ago for 90 days worth. Her PCP felt as though she had not been compliant. She smokes 3 cigarettes per day and drinks two fifths of liquor per week. She was at work in her usual state of health when she collapsed in the early PM hours of 6/5. Collapse was witnessed and EMS was called right away. Reports of bystander CPR. EMS responded in approximately 5 mins. Initial rhythm VF. Patient underwent ACLS with defibrillation for entire trip to ED. Continued to be in VF for first 30 mins in ED with brief periods of ROSC. She was intubated, CVL placed, and treated with amiodarone and norepinephrine infusions in addition to ACLS with multiple defibrillations. Once ROSC achieved with relative stability, PCCM has been asked to admit.  Past Medical History   has a past medical history of Acute MI ([place]), Cardiomyopathy, severe by echo with EF 10-20% (09/11/2011), and MR (mitral regurgitation), moderate (09/11/2011).   Significant Hospital Events   6/5 admit for cardiac arrest  Consults:  Cardiology  Procedures:  ETT 6/5 > CVL 6/5 >  Significant Diagnostic Tests:  CT head 6/5 > Echo 6/5 > EEG 6/5 >  Micro Data:  COVID-19 5/6 > negative  Blood Cx 6/5 > Urine 6/5 > Tracheal aspirate 6/5 >  Antimicrobials:  Unasyn 6/5 >  Interim history/subjective:    Objective   Blood pressure 96/66, pulse 66, temperature (!) 96.1 F (35.6 C), resp. rate 11, height 5'  4" (1.626 m), weight 68 kg, SpO2 99 %. CVP:  [6 mmHg-11 mmHg] 11 mmHg  Vent Mode: PRVC FiO2 (%):  [40 %-100 %] 40 % Set Rate:  [18 bmp] 18 bmp Vt Set:  [440 mL] 440 mL PEEP:  [5 cmH20-10 cmH20] 8 cmH20 Plateau Pressure:  [17 cmH20-18 cmH20] 18 cmH20   Intake/Output Summary (Last 24 hours) at 10/25/2018 1104 Last data filed at 10/25/2018 1000 Gross per 24 hour  Intake 3125.11 ml  Output 340 ml  Net 2785.11 ml   Filed Weights   11/07/2018 1253  Weight: 68 kg    Examination: General: Elderly female, intubated on mechanical ventilation, cooling protocol pads in place HENT: Endo tracheal tube in place, pupils reactive Lungs: Bilateral breath sounds, bilateral ventilated breath sounds Cardiovascular: Tachycardic, regular, no MRG Abdomen: Soft, nontender nondistended Extremities: No significant edema Neuro: Unresponsive to pain, sedated GU: Foley in place  Resolved Hospital Problem list     Assessment & Plan:   Cardiac arrest: VF arrest, prolonged downtime.  -Current management with targeted temperature protocol -Progressive cardiogenic shock overnight. -Change temperature management goals to 36 degrees -Hopefully by warming the patient will allow [place] to remove continuous paralysis and decrease sedation to help decrease hypotensive effect of these medications to reduce our need for higher vasopressor requirements.  Cardiogenic shock Acute on chronic HFrEF -Continue use of norepinephrine, epinephrine and vasopressin to maintain mean arterial pressure greater than 65  mmHg. Overall outcome in this state following cardiac arrest is poor  Acute hypoxemic respiratory failure requiring intubation mechanical ventilation Right upper lobe infiltrate Chest x-ray Nov 22, 2018 reviewed, right upper lobe infiltrate The patient's images have been independently reviewed by me.   -Continue Unasyn for treatment of aspiration pneumonia -Patient remains on full adult mechanical ventilatory support We  will able to decrease PEEP and FiO2 as tolerated to maintain sats greater than 90% Currently sedated on fentanyl and continuous Versed infusions as well as Nimbex. Once patient has reached target temp of 36 degrees we will be able to discontinue Nimbex per protocol as well as weaning from fentanyl and Versed infusions.  Acute renal failure, AKI Lactic acidosis -Follow metabolic panel -Replace electrolytes as needed Follow urine output -Lactate improving, will continue to observe  Hyperkalemia -I suspect this is an erroneous lab draw potentially from a line that had potassium supplementation going through. - She has already received replacements.  We will repeat this.  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 RASS -5 VAP protocol (if indicated): Per protocl DVT prophylaxis: SQH GI prophylaxis: pepcid Glucose control: SSI Mobility: BR Code Status: FULL Family Communication: Son Dorene Sorrow updated Disposition: ICU  Labs   CBC: Recent Labs  Lab 11-22-2018 1318  11-22-2018 2010 22-Nov-2018 2327 10/25/18 0325 10/25/18 0507 10/25/18 0512  WBC 10.3  --   --   --   --  20.7*  --   NEUTROABS 3.5  --   --   --   --   --   --   HGB 12.9   < > 14.3 13.9 13.3 12.8 12.2  HCT 41.3   < > 42.0 41.0 39.0 38.5 36.0  MCV 118.3*  --   --   --   --  108.8*  --   PLT 157  --   --   --   --  209  --    < > = values in this interval not displayed.    Basic Metabolic Panel: Recent Labs  Lab 2018-11-22 1318  11-22-18 1719  November 22, 2018 1959 11-22-18 2010 Nov 22, 2018 2327 10/25/18 0325 10/25/18 0507 10/25/18 0512 10/25/18 0824 10/25/18 1019  NA 135   < >  --    < > 135 137 138 138 139 138 137  --   K 3.0*   < >  --    < > 2.4* 2.4* 2.5* 3.2* 3.4* 3.4* 6.0* 4.9  CL 97*  --   --   --   102 101 104 106 108  --  106  --   CO2 12*  --   --   --  19*  --   --   --  15*  --  16*  --   GLUCOSE 523*  --   --    < > 240* 241* 201* 150* 175*  --  240*  --   BUN 12  --   --   --  20 21 21 23 23   --  23  --   CREATININE 1.47*  --   --   --  1.81* 1.60* 1.70* 1.80* 2.07*  --  2.43*  --   CALCIUM 11.0*  --   --   --  8.9  --   --   --  8.3*  --  8.0*  --  MG  --   --  2.0  --   --   --   --   --  1.8  --   --   --   PHOS  --   --   --   --   --   --   --   --  3.0  --   --   --    < > = values in this interval not displayed.   GFR: Estimated Creatinine Clearance: 21.6 mL/min (A) (by C-G formula based on SCr of 2.43 mg/dL (H)). Recent Labs  Lab 27-Apr-2019 1318 27-Apr-2019 2114 10/25/18 0507  PROCALCITON 0.10 56.34 84.70  WBC 10.3  --  20.7*  LATICACIDVEN >11.0* 7.3* 6.3*    Liver Function Tests: Recent Labs  Lab 27-Apr-2019 1318  AST 127*  ALT 56*  ALKPHOS 88  BILITOT 0.8  PROT 5.9*  ALBUMIN 2.7*   No results for input(s): LIPASE, AMYLASE in the last 168 hours. No results for input(s): AMMONIA in the last 168 hours.  ABG    Component Value Date/Time   PHART 7.304 (L) 10/25/2018 0512   PCO2ART 26.9 (L) 10/25/2018 0512   PO2ART 73.0 (L) 10/25/2018 0512   HCO3 14.5 (L) 10/25/2018 0512   TCO2 16 (L) 10/25/2018 0512   ACIDBASEDEF 12.0 (H) 10/25/2018 0512   O2SAT 97.0 10/25/2018 0512     Coagulation Profile: Recent Labs  Lab 27-Apr-2019 1719 27-Apr-2019 2114 10/25/18 0507  INR 1.4* 1.3* 1.2    Cardiac Enzymes: Recent Labs  Lab 27-Apr-2019 1719 27-Apr-2019 1959 10/25/18 0208 10/25/18 0824  TROPONINI 18.31* 30.19* 28.47* 22.82*    HbA1C: Hemoglobin A1C  Date/Time Value Ref Range Status  02/13/2017 03:18 PM 5.0  Final  08/11/2013 10:38 AM 5.5  Final   Hgb A1c MFr Bld  Date/Time Value Ref Range Status  07-Dec-202020 09:14 PM 4.9 4.8 - 5.6 % Final    Comment:    (NOTE) Pre diabetes:          5.7%-6.4% Diabetes:              >6.4% Glycemic control for   <7.0%  adults with diabetes     CBG: Recent Labs  Lab 10/25/18 0105 10/25/18 0322 10/25/18 0508 10/25/18 0657 10/25/18 0918  GLUCAP 132* 151* 170* 179* 232*    Review of Systems:   Unable: intubated  Past Medical History  She,  has a past medical history of Acute MI (HCC), Cardiomyopathy, severe by echo with EF 10-20% (09/11/2011), and MR (mitral regurgitation), moderate (09/11/2011).   Surgical History   No past surgical history on file.   Social History   reports that she has been smoking cigarettes. She has a 3.75 pack-year smoking history. She has never used smokeless tobacco. She reports current alcohol use of about 9.0 standard drinks of alcohol per week. She reports current drug use. Frequency: 1.00 time per week. Drug: Cocaine.   Family History   Her family history is not on file.   Allergies No Known Allergies   Home Medications  Prior to Admission medications   Medication Sig Start Date End Date Taking? Authorizing Provider  amLODipine (NORVASC) 5 MG tablet Take 1 tablet (5 mg total) by mouth daily. 05/22/18   Loletta SpecterGomez, Roger David, PA-C  aspirin 81 MG tablet Take 1 tablet (81 mg total) by mouth daily. 02/13/17   Loletta SpecterGomez, Roger David, PA-C  atorvastatin (LIPITOR) 40 MG tablet Take 2 tablets (80 mg total) by mouth daily. 05/28/18  Loletta SpecterGomez, Roger David, PA-C  fluticasone [facility](FLONASE) 50 MCG/ACT nasal spray Place 2 sprays into both nostrils daily. 07/01/18   Cathie HoopsYu, Amy V, PA-C  hydrochlorothiazide (HYDRODIURIL) 25 MG tablet Take 1 tablet (25 mg total) by mouth daily. 05/22/18   Loletta SpecterGomez, Roger David, PA-C  ipratropium (ATROVENT) 0.06 % nasal spray Place 2 sprays into both nostrils 4 (four) times daily. 07/01/18   Cathie HoopsYu, Amy V, PA-C  metoprolol succinate (TOPROL XL) 25 MG 24 hr tablet Take 1 tablet (25 mg total) by mouth daily. 05/22/18   Loletta SpecterGomez, Roger David, PA-C  ondansetron (ZOFRAN ODT) 4 MG disintegrating tablet Take 1 tablet (4 mg total) by mouth every 8 (eight) hours as needed for nausea or  vomiting. 07/01/18   Cathie HoopsYu, Amy V, PA-C  PAZEO 0.7 % SOLN Place 0.7 Bottles into both eyes every morning. 10/12/17   [provider]  varenicline (CHANTIX STARTING MONTH PAK) 0.5 MG X 11 & 1 MG X 42 tablet Take one 0.5 mg tablet by mouth once daily for 3 days, then increase to one 0.5 mg tablet twice daily for 4 days, then increase to one 1 mg tablet twice daily. 05/22/18   Loletta SpecterGomez, Roger David, PA-C     This patient is critically ill with multiple organ system failure; which, requires frequent high complexity decision making, assessment, support, evaluation, and titration of therapies. This was completed through the application of advanced monitoring technologies and extensive interpretation of multiple databases. During this encounter critical care time was devoted to patient care services described in this note for 34 minutes.  Josephine IgoBradley L Icard, DO [place] [facility] 10/25/2018 11:04 AM  Personal pager: [phone] If unanswered, please page CCM On-call: #[phone]

## 2018-10-25 NOTE — Progress Notes (Signed)
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 He ordered to increase Vasopressin to 0.04 and add Epinephrine. He also ordered to start rewarming to a goal of 36.0 instead of maintaining at 33.0. Changed machine to begin rewarming to a goal of 36.0. Will begin to decrease sedation as she warms up.

## 2018-10-25 NOTE — Progress Notes (Signed)
CRITICAL VALUE ALERT  Critical Value:  Potassium 6.0  Date & Time Notied:  10/25/2018 @ 1000  Provider Notified: Dr. Valeta Harms  Orders Received/Actions taken: Repeat lab

## 2018-10-25 NOTE — Progress Notes (Signed)
Spoke w/ Kennieth Rad NP from CCM at shift change for clarification on TTM orders/plan of care. Was instructed to maintain patient at normothermia (36 C) for now and not to initiate re-warming to 37 C. Simpson NP indicated that she would adjust pt's orders to reflect this plan. CCM MD was also present during this discussion.  Nelva Bush RN

## 2018-10-25 NOTE — Progress Notes (Signed)
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 She began warming up this morning well as we changed to rewarming. Temp got to 35.4 and then she started dropping back down quickly. Bear hugger applied shortly before 2, but temp continues to decrease. No new orders, will continue to monitor closely.

## 2018-10-25 NOTE — Progress Notes (Signed)
Progress Note  Patient Name: Dawn Cruz Date of Encounter: 10/25/2018  Primary Cardiologist: Hochrein   Subjective   68 year old female who was admitted with cardiac arrest.  She has a past medical history of VF arrest in 2013.  She has a cardiomyopathy with an ejection fraction of 10 to 20%.  She has a long history of hypertension, hyperlipidemia, mitral regurgitation, tobacco and cocaine abuse.  She had a witnessed collapse early in the afternoon of October 24, 2018.  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  Coworkers started CPR.  EMS arrived approximately 5 minutes later.  She received multiple rounds of epinephrine and amiodarone.  She had multiple defibrillations.  They were eventually able to achieve ROSC.  She was brought to [facility] and was started on hypothermia protocol.  She was last seen by cardiology in 2013 when she had a another VF arrest.  During that time her echocardiogram revealed severely reduced left ventricular systolic function with an ejection fraction of 10 to 20%.  Nuclear stress test revealed no evidence of reversible ischemia.  She was lost to follow-up from a cardiology standpoint.  UDS screen was negative for cocaine this admission. Negative for COVID 19  She has been hypotensive.   Is on Levo, epi drip, now on vasopressin  Very little urine output for since yesterday . Pulse waveform is very narrow    Inpatient Medications    Scheduled Meds:  artificial tears  1 application Both Eyes G9F   chlorhexidine gluconate (MEDLINE KIT)  15 mL Mouth Rinse BID   Chlorhexidine Gluconate Cloth  6 each Topical Daily   cisatracurium  0.1 mg/kg Intravenous Once   famotidine  20 mg Per Tube QHS   heparin  5,000 Units Subcutaneous Q8H   insulin aspart  0-15 Units Subcutaneous Q4H   mouth rinse  15 mL Mouth Rinse 10 times per day   sodium chloride flush  10-40 mL Intracatheter Q12H   Continuous Infusions:  sodium chloride 50 mL/hr at  10/25/18 0700   sodium chloride 10 mL/hr at 10/25/18 0700   amiodarone 30 mg/hr (10/25/18 0700)   ampicillin-sulbactam (UNASYN) IV 200 mL/hr at 10/25/18 0700   cisatracurium (NIMBEX) infusion 1.5 mcg/kg/min (10/25/18 0700)   epinephrine 0.5 mcg/min (10/25/18 0808)   fentaNYL infusion INTRAVENOUS 200 mcg/hr (10/25/18 0700)   midazolam 3 mg/hr (10/25/18 0814)   norepinephrine (LEVOPHED) Adult infusion 40 mcg/min (10/25/18 0700)   vasopressin (PITRESSIN) infusion - *FOR SHOCK* 0.04 Units/min (10/25/18 0745)   PRN Meds: sodium chloride, cisatracurium **AND** cisatracurium (NIMBEX) infusion **AND** cisatracurium, fentaNYL, midazolam, sodium chloride flush   Vital Signs    Vitals:   10/25/18 0730 10/25/18 0745 10/25/18 0800 10/25/18 0815  BP:   (!) 84/64   Pulse: (!) 51 (!) 49 (!) 51 (!) 51  Resp: (!) 3 (!) 6 12 (!) 21  Temp:      TempSrc:      SpO2: 100% 100% 100% 100%  Weight:      Height:        Intake/Output Summary (Last 24 hours) at 10/25/2018 0818 Last data filed at 10/25/2018 0700 Gross per 24 hour  Intake 2625.51 ml  Output 290 ml  Net 2335.51 ml   Last 3 Weights 10/28/2018 05/22/2018 10/29/2017  Weight (lbs) 150 lb 157 lb 3.2 oz 154 lb  Weight (kg) 68.04 kg 71.305 kg 69.854 kg      Telemetry    NSR  - Personally Reviewed  ECG  NSR ,  TWI in ant leads.    - Personally Reviewed  Physical Exam   GEN: middle age female,  Intubated, sedated. Unresponsive , cool  Neck: No JVD Cardiac: RRR, no murmurs, rubs, or gallops.  Respiratory: Clear to auscultation bilaterally. GI: Soft, nontender, non-distended  MS: No edema;, legs are cool  Neuro:  unable to assess  Psych: unable to assess   Labs    Chemistry Recent Labs  Lab 11/04/2018 1318  11/17/2018 1959  11/04/2018 2327 10/25/18 0325 10/25/18 0507 10/25/18 0512  NA 135   < > 135   < > 138 138 139 138  K 3.0*   < > 2.4*   < > 2.5* 3.2* 3.4* 3.4*  CL 97*  --  102   < > 104 106 108  --   CO2 12*  --  19*   --   --   --  15*  --   GLUCOSE 523*   < > 240*   < > 201* 150* 175*  --   BUN 12  --  20   < > '21 23 23  ' --   CREATININE 1.47*  --  1.81*   < > 1.70* 1.80* 2.07*  --   CALCIUM 11.0*  --  8.9  --   --   --  8.3*  --   PROT 5.9*  --   --   --   --   --   --   --   ALBUMIN 2.7*  --   --   --   --   --   --   --   AST 127*  --   --   --   --   --   --   --   ALT 56*  --   --   --   --   --   --   --   ALKPHOS 88  --   --   --   --   --   --   --   BILITOT 0.8  --   --   --   --   --   --   --   GFRNONAA 37*  --  29*  --   --   --  24*  --   GFRAA 43*  --  33*  --   --   --  28*  --   ANIONGAP 26*  --  14  --   --   --  16*  --    < > = values in this interval not displayed.     Hematology Recent Labs  Lab 10/29/2018 1318  10/25/18 0325 10/25/18 0507 10/25/18 0512  WBC 10.3  --   --  20.7*  --   RBC 3.49*  --   --  3.54*  --   HGB 12.9   < > 13.3 12.8 12.2  HCT 41.3   < > 39.0 38.5 36.0  MCV 118.3*  --   --  108.8*  --   MCH 37.0*  --   --  36.2*  --   MCHC 31.2  --   --  33.2  --   RDW 12.5  --   --  12.3  --   PLT 157  --   --  209  --    < > = values in this interval not displayed.    Cardiac Enzymes Recent Labs  Lab 11/02/2018 1719  10/20/2018 1959 10/25/18 0208  TROPONINI 18.31* 30.19* 28.47*   No results for input(s): TROPIPOC in the last 168 hours.   BNPNo results for input(s): BNP, PROBNP in the last 168 hours.   DDimer  Recent Labs  Lab 10/23/2018 1318  DDIMER >20.00*     Radiology    Ct Code Stroke Cta Head W/wo Contrast  Result Date: 11/08/2018 CLINICAL DATA:  Cardiac arrest. EXAM: CT ANGIOGRAPHY HEAD AND NECK CT PERFUSION BRAIN TECHNIQUE: Multidetector CT imaging of the head and neck was performed using the standard protocol during bolus administration of intravenous contrast. Multiplanar CT image reconstructions and MIPs were obtained to evaluate the vascular anatomy. Carotid stenosis measurements (when applicable) are obtained utilizing NASCET criteria, using  the distal internal carotid diameter as the denominator. Multiphase CT imaging of the brain was performed following IV bolus contrast injection. Subsequent parametric perfusion maps were calculated using RAPID software. CONTRAST:  124m OMNIPAQUE IOHEXOL 350 MG/ML SOLN COMPARISON:  CT head earlier today. FINDINGS: CTA NECK FINDINGS Aortic arch: Bovine trunk. No proximal stenosis. Right carotid system: No evidence of dissection, stenosis (50% or greater) or occlusion. Mild plaque at the RIGHT ICA origin. Left carotid system: No evidence of dissection, stenosis (50% or greater) or occlusion. Mild plaque at the LEFT ICA origin. Vertebral arteries: BILATERAL patent, LEFT dominant. No flow-limiting ostial stenosis. Skeleton: Spondylosis. Poor dentition. Other neck: No neck masses. Upper chest: Extensive consolidation in the dependent aspect of the RIGHT lung, query aspiration. Review of the MIP images confirms the above findings CTA HEAD FINDINGS Anterior circulation: Mild diffuse narrowing of the upper cervical and petrous ICA segments bilaterally, greater on the RIGHT. 50% cavernous carotid stenoses. Slight increased caliber of the LEFT MCA compared to the RIGHT, with asymmetric prominence of the LEFT M2 and M3 vessels compared to the RIGHT, associated with slight LEFT hemisphere parenchymal blush, favored to represent postictal phenomenon. No M2 or M3 vessel occlusion. Posterior circulation: No significant stenosis, proximal occlusion, aneurysm, or vascular malformation. Venous sinuses: As permitted by contrast timing, patent. Anatomic variants: None of significance. Delayed phase: Not performed Review of the MIP images confirms the above findings CT Brain Perfusion Findings: ASPECTS: 10 CBF (<30%) Volume: 052mPerfusion (Tmax>6.0s) volume: 74m79mismatch Volume: 74mL108mfarction Location:Not applicable. Diffusely, and profoundly increased regional cerebral blood flow, regional cerebral blood volume, with decreased T-max,  over much of the LEFT hemisphere, most consistent with recent or ongoing epileptiform activity. IMPRESSION: 1. No emergent large vessel occlusion. 2. Diffusely increased regional cerebral blood flow and blood volume over much of the LEFT hemisphere, decreased T-max, consistent with interictal or postictal phenomenon. 3. No extracranial or intracranial flow reducing stenosis or dissection. 4. Extensive consolidation in the dependent portion of the RIGHT lung, query aspiration. 5. These results were called by telephone at the time of interpretation on 11/05/2018 at 7:06 pm to Dr. ArorRory Percyo verbally acknowledged these results. Electronically Signed   By: JohnStaci Righter.   On: 11/13/2018 19:19   Ct Head Wo Contrast  Result Date: 11/05/2018 CLINICAL DATA:  Collapse, cardiac arrest EXAM: CT HEAD WITHOUT CONTRAST TECHNIQUE: Contiguous axial images were obtained from the base of the skull through the vertex without intravenous contrast. COMPARISON:  09/11/2011 FINDINGS: Brain: There is subtle hypodensity and loss of gray-white distinction of the left MCA territory (series 2, image 21, series 4, image 32). Vascular: No hyperdense vessel or unexpected calcification. Skull: Normal. Negative for fracture or focal lesion. Sinuses/Orbits: No acute finding. Other: None. IMPRESSION: There  is subtle hypodensity and loss of gray-white distinction of the left MCA territory (series 2, image 21, series 4, image 32), concerning for large left MCA territory infarction. Consider MRI to more sensitively evaluate for acute diffusion restricting infarction. No intracranial hemorrhage. Electronically Signed   By: Eddie Candle M.D.   On: 11/09/2018 16:54   Ct Angio Neck W Or Wo Contrast  Result Date: 11/04/2018 CLINICAL DATA:  Cardiac arrest. EXAM: CT ANGIOGRAPHY HEAD AND NECK CT PERFUSION BRAIN TECHNIQUE: Multidetector CT imaging of the head and neck was performed using the standard protocol during bolus administration of intravenous  contrast. Multiplanar CT image reconstructions and MIPs were obtained to evaluate the vascular anatomy. Carotid stenosis measurements (when applicable) are obtained utilizing NASCET criteria, using the distal internal carotid diameter as the denominator. Multiphase CT imaging of the brain was performed following IV bolus contrast injection. Subsequent parametric perfusion maps were calculated using RAPID software. CONTRAST:  139m OMNIPAQUE IOHEXOL 350 MG/ML SOLN COMPARISON:  CT head earlier today. FINDINGS: CTA NECK FINDINGS Aortic arch: Bovine trunk. No proximal stenosis. Right carotid system: No evidence of dissection, stenosis (50% or greater) or occlusion. Mild plaque at the RIGHT ICA origin. Left carotid system: No evidence of dissection, stenosis (50% or greater) or occlusion. Mild plaque at the LEFT ICA origin. Vertebral arteries: BILATERAL patent, LEFT dominant. No flow-limiting ostial stenosis. Skeleton: Spondylosis. Poor dentition. Other neck: No neck masses. Upper chest: Extensive consolidation in the dependent aspect of the RIGHT lung, query aspiration. Review of the MIP images confirms the above findings CTA HEAD FINDINGS Anterior circulation: Mild diffuse narrowing of the upper cervical and petrous ICA segments bilaterally, greater on the RIGHT. 50% cavernous carotid stenoses. Slight increased caliber of the LEFT MCA compared to the RIGHT, with asymmetric prominence of the LEFT M2 and M3 vessels compared to the RIGHT, associated with slight LEFT hemisphere parenchymal blush, favored to represent postictal phenomenon. No M2 or M3 vessel occlusion. Posterior circulation: No significant stenosis, proximal occlusion, aneurysm, or vascular malformation. Venous sinuses: As permitted by contrast timing, patent. Anatomic variants: None of significance. Delayed phase: Not performed Review of the MIP images confirms the above findings CT Brain Perfusion Findings: ASPECTS: 10 CBF (<30%) Volume: 09mPerfusion  (Tmax>6.0s) volume: 59m54mismatch Volume: 59mL57mfarction Location:Not applicable. Diffusely, and profoundly increased regional cerebral blood flow, regional cerebral blood volume, with decreased T-max, over much of the LEFT hemisphere, most consistent with recent or ongoing epileptiform activity. IMPRESSION: 1. No emergent large vessel occlusion. 2. Diffusely increased regional cerebral blood flow and blood volume over much of the LEFT hemisphere, decreased T-max, consistent with interictal or postictal phenomenon. 3. No extracranial or intracranial flow reducing stenosis or dissection. 4. Extensive consolidation in the dependent portion of the RIGHT lung, query aspiration. 5. These results were called by telephone at the time of interpretation on 10/29/2018 at 7:06 pm to Dr. ArorRory Percyo verbally acknowledged these results. Electronically Signed   By: JohnStaci Righter.   On: 11/11/2018 19:19   Ct Cerebral Perfusion W Contrast  Result Date: 11/15/2018 CLINICAL DATA:  Cardiac arrest. EXAM: CT ANGIOGRAPHY HEAD AND NECK CT PERFUSION BRAIN TECHNIQUE: Multidetector CT imaging of the head and neck was performed using the standard protocol during bolus administration of intravenous contrast. Multiplanar CT image reconstructions and MIPs were obtained to evaluate the vascular anatomy. Carotid stenosis measurements (when applicable) are obtained utilizing NASCET criteria, using the distal internal carotid diameter as the denominator. Multiphase CT imaging of the brain was performed  following IV bolus contrast injection. Subsequent parametric perfusion maps were calculated using RAPID software. CONTRAST:  128m OMNIPAQUE IOHEXOL 350 MG/ML SOLN COMPARISON:  CT head earlier today. FINDINGS: CTA NECK FINDINGS Aortic arch: Bovine trunk. No proximal stenosis. Right carotid system: No evidence of dissection, stenosis (50% or greater) or occlusion. Mild plaque at the RIGHT ICA origin. Left carotid system: No evidence of dissection,  stenosis (50% or greater) or occlusion. Mild plaque at the LEFT ICA origin. Vertebral arteries: BILATERAL patent, LEFT dominant. No flow-limiting ostial stenosis. Skeleton: Spondylosis. Poor dentition. Other neck: No neck masses. Upper chest: Extensive consolidation in the dependent aspect of the RIGHT lung, query aspiration. Review of the MIP images confirms the above findings CTA HEAD FINDINGS Anterior circulation: Mild diffuse narrowing of the upper cervical and petrous ICA segments bilaterally, greater on the RIGHT. 50% cavernous carotid stenoses. Slight increased caliber of the LEFT MCA compared to the RIGHT, with asymmetric prominence of the LEFT M2 and M3 vessels compared to the RIGHT, associated with slight LEFT hemisphere parenchymal blush, favored to represent postictal phenomenon. No M2 or M3 vessel occlusion. Posterior circulation: No significant stenosis, proximal occlusion, aneurysm, or vascular malformation. Venous sinuses: As permitted by contrast timing, patent. Anatomic variants: None of significance. Delayed phase: Not performed Review of the MIP images confirms the above findings CT Brain Perfusion Findings: ASPECTS: 10 CBF (<30%) Volume: 034mPerfusion (Tmax>6.0s) volume: 44m44mismatch Volume: 44mL84mfarction Location:Not applicable. Diffusely, and profoundly increased regional cerebral blood flow, regional cerebral blood volume, with decreased T-max, over much of the LEFT hemisphere, most consistent with recent or ongoing epileptiform activity. IMPRESSION: 1. No emergent large vessel occlusion. 2. Diffusely increased regional cerebral blood flow and blood volume over much of the LEFT hemisphere, decreased T-max, consistent with interictal or postictal phenomenon. 3. No extracranial or intracranial flow reducing stenosis or dissection. 4. Extensive consolidation in the dependent portion of the RIGHT lung, query aspiration. 5. These results were called by telephone at the time of interpretation on  11/07/2018 at 7:06 pm to Dr. ArorRory Percyo verbally acknowledged these results. Electronically Signed   By: JohnStaci Righter.   On: 10/21/2018 19:19   Dg Chest Portable 1 View  Result Date: 10/20/2018 CLINICAL DATA:  Intubation, CPR EXAM: PORTABLE CHEST 1 VIEW COMPARISON:  Portable exam 1300 hours compared to 09/17/2011 FINDINGS: Tip of endotracheal tube projects 1.9 cm above carina. Nasogastric tube extends into stomach. Upper normal heart size. RIGHT upper lobe infiltrate which could represent pneumonia or aspiration. Remaining lungs grossly clear. No pleural effusion or pneumothorax. IMPRESSION: RIGHT upper lobe infiltrate question pneumonia versus aspiration. Electronically Signed   By: MarkLavonia Dana.   On: 11/18/2018 13:12    Cardiac Studies     Patient Profile     67 y43. female with hx of and is nonischemic cardiomyopathy with an ejection fraction of 10 to 20%.  She has a history of ventricular fibrillation in the past and was admitted yesterday following a VF arrest.  She is now in the ICU.  She is intubated and sedated.  She has been on the [employer].  Assessment & Plan    1.  Status post VF arrest: She is not had any further arrhythmias.  Continue amiodarone drip.  Continue supportive measures per pulmonary critical care medicine. If she makes meaningful recovery will proceed with heart catheterization. Troponin level is 28.47.  This could be due to a myocardial infarction or perhaps  could be due to her CPR  with multiple defibrillations.   2.  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  Continue supportive care.   3.  Cardiogenic shock: Patient's blood pressures been very low.  She is on multiple pressors at this time.  Amlodipine, metoprolol and HCTZ have been on hold.       For questions or updates, please contact Theresa Please consult [email or web address] for contact info under          Signed, Mertie Moores, MD  10/25/2018, 8:18 AM

## 2018-10-25 NOTE — Progress Notes (Signed)
Initial Nutrition Assessment  DOCUMENTATION CODES:   Not applicable  INTERVENTION:   If tube feeds initiated, recommend:   Vital 1.2 @ 24ml/hr + Prostat 18ml daily via tube  Free water flushes 78ml q4 hours to maintain tube patency   Regimen provides 1396kcal/day, 96g/day protein, 1070ml/day free water   Recommend liquid MVI daily via tube   NUTRITION DIAGNOSIS:   Inadequate oral intake related to inability to eat(pt sedated and ventilated ) as evidenced by NPO status.  GOAL:   Provide needs based on ASPEN/SCCM guidelines  MONITOR:   Vent status, Labs, Weight trends, I & O's, Skin  REASON FOR ASSESSMENT:   Ventilator    ASSESSMENT:   68 year old female who was admitted with cardiac arrest.  She has a past medical history of VF arrest in 2013.  She has a cardiomyopathy with an ejection fraction of 10 to 20%.  She has a long history of hypertension, hyperlipidemia, mitral regurgitation, tobacco and cocaine abuse.  RD working remotely.  Pt sedated and ventilated. OGT in place. Pt on cooling protocol. Pt found to have aspiration PNA. Pt with poor dentition, has been losing teeth while hospitalized. No plans for tube feeds today; recommendations above when medically appropriate. Suspect moderate refeed risk. Per chart, pt is weight stable for the past year.   Medications reviewed and include: pepcid, heparin, insulin, NaCl @50ml /hr, unasyn, epinephrine, fentanyl, versed, levophed, vasopressin   Labs reviewed: BUN 25(H), creat 2.46(H) Wbc- 20.7(H)  Patient is currently intubated on ventilator support MV: 7.7 L/min Temp (24hrs), Avg:92.6 F (33.7 C), Min:87.8 F (31 C), Max:98.2 F (36.8 C)  Propofol: none   MAP- >70mmHg  UOP- 265ml output  Unable to complete Nutrition-Focused physical exam at this time.   Diet Order:   Diet Order            Diet NPO time specified  Diet effective now             EDUCATION NEEDS:   No education needs have been  identified at this time  Skin:  Skin Assessment: Reviewed RN Assessment(ecchymosis )  Last BM:  pta  Height:   Ht Readings from Last 1 Encounters:  2018-11-04 5\' 4"  (1.626 m)    Weight:   Wt Readings from Last 1 Encounters:  11/04/18 68 kg    Ideal Body Weight:  54.5 kg  BMI:  Body mass index is 25.75 kg/m.  Estimated Nutritional Needs:   Kcal:  1338kcal/day  Protein:  88-102g/day   Fluid:  >1.6L/day   Koleen Distance MS, RD, LDN Pager #- [phone] Office#- [phone] After Hours Pager: [phone]

## 2018-10-25 NOTE — Progress Notes (Signed)
While assessing patient at beginning of shift, it appeared that patient had loose teeth (unattached from gumline) in her mouth. This RN, Jossie Ng RN, and Kennedy Bucker NT removed 3 loose teeth and placed in a specimen cup with patient label. Later in the shift, 3 more of patient's teeth were found to be loose in patient's mouth. These teeth were also removed and stored in the same specimen cup.  Nelva Bush RN

## 2018-10-26 ENCOUNTER — Inpatient Hospital Stay (HOSPITAL_COMMUNITY): Payer: Medicare Other

## 2018-10-26 DIAGNOSIS — I469 Cardiac arrest, cause unspecified: Secondary | ICD-10-CM | POA: Diagnosis present

## 2018-10-26 DIAGNOSIS — Z515 Encounter for palliative care: Secondary | ICD-10-CM

## 2018-10-26 DIAGNOSIS — J9621 Acute and chronic respiratory failure with hypoxia: Secondary | ICD-10-CM

## 2018-10-26 DIAGNOSIS — J9601 Acute respiratory failure with hypoxia: Secondary | ICD-10-CM

## 2018-10-26 LAB — POCT I-STAT 7, (LYTES, BLD GAS, ICA,H+H)
Acid-base deficit: 19 mmol/L — ABNORMAL HIGH (ref 0.0–2.0)
Bicarbonate: 9.1 mmol/L — ABNORMAL LOW (ref 20.0–28.0)
Calcium, Ion: 1.06 mmol/L — ABNORMAL LOW (ref 1.15–1.40)
HCT: 35 % — ABNORMAL LOW (ref 36.0–46.0)
Hemoglobin: 11.9 g/dL — ABNORMAL LOW (ref 12.0–15.0)
O2 Saturation: 95 %
Patient temperature: 34.8
Potassium: 5.8 mmol/L — ABNORMAL HIGH (ref 3.5–5.1)
Sodium: 138 mmol/L (ref 135–145)
TCO2: 10 mmol/L — ABNORMAL LOW (ref 22–32)
pCO2 arterial: 24.5 mmHg — ABNORMAL LOW (ref 32.0–48.0)
pH, Arterial: 7.164 — CL (ref 7.350–7.450)
pO2, Arterial: 88 mmHg (ref 83.0–108.0)

## 2018-10-26 LAB — PROCALCITONIN: Procalcitonin: 118.05 ng/mL

## 2018-10-26 LAB — BASIC METABOLIC PANEL
Anion gap: 21 — ABNORMAL HIGH (ref 5–15)
Anion gap: 24 — ABNORMAL HIGH (ref 5–15)
BUN: 31 mg/dL — ABNORMAL HIGH (ref 8–23)
BUN: 37 mg/dL — ABNORMAL HIGH (ref 8–23)
CO2: 10 mmol/L — ABNORMAL LOW (ref 22–32)
CO2: 14 mmol/L — ABNORMAL LOW (ref 22–32)
Calcium: 7.7 mg/dL — ABNORMAL LOW (ref 8.9–10.3)
Calcium: 7.4 mg/dL — ABNORMAL LOW (ref 8.9–10.3)
Chloride: 109 mmol/L (ref 98–111)
Chloride: 104 mmol/L (ref 98–111)
Creatinine, Ser: 3.52 mg/dL — ABNORMAL HIGH (ref 0.44–1.00)
Creatinine, Ser: 4.07 mg/dL — ABNORMAL HIGH (ref 0.44–1.00)
GFR calc Af Amer: 15 mL/min — ABNORMAL LOW (ref >60)
GFR calc Af Amer: 12 mL/min — ABNORMAL LOW (ref >60)
GFR calc non Af Amer: 13 mL/min — ABNORMAL LOW (ref >60)
GFR calc non Af Amer: 11 mL/min — ABNORMAL LOW (ref >60)
Glucose, Bld: 136 mg/dL — ABNORMAL HIGH (ref 70–99)
Glucose, Bld: 130 mg/dL — ABNORMAL HIGH (ref 70–99)
Potassium: 5.9 mmol/L — ABNORMAL HIGH (ref 3.5–5.1)
Potassium: 5.9 mmol/L — ABNORMAL HIGH (ref 3.5–5.1)
Sodium: 140 mmol/L (ref 135–145)
Sodium: 142 mmol/L (ref 135–145)

## 2018-10-26 LAB — CBC
HCT: 36.6 % (ref 36.0–46.0)
Hemoglobin: 11.9 g/dL — ABNORMAL LOW (ref 12.0–15.0)
MCH: 37 pg — ABNORMAL HIGH (ref 26.0–34.0)
MCHC: 32.5 g/dL (ref 30.0–36.0)
MCV: 113.7 fL — ABNORMAL HIGH (ref 80.0–100.0)
Platelets: 194 10*3/uL (ref 150–400)
RBC: 3.22 MIL/uL — ABNORMAL LOW (ref 3.87–5.11)
RDW: 12.9 % (ref 11.5–15.5)
WBC: 18.3 10*3/uL — ABNORMAL HIGH (ref 4.0–10.5)
nRBC: 0.3 % — ABNORMAL HIGH (ref 0.0–0.2)

## 2018-10-26 LAB — GLUCOSE, CAPILLARY
Glucose-Capillary: 108 mg/dL — ABNORMAL HIGH (ref 70–99)
Glucose-Capillary: 95 mg/dL (ref 70–99)
Glucose-Capillary: 87 mg/dL (ref 70–99)
Glucose-Capillary: 99 mg/dL (ref 70–99)
Glucose-Capillary: 99 mg/dL (ref 70–99)

## 2018-10-26 LAB — PHOSPHORUS: Phosphorus: 7.2 mg/dL — ABNORMAL HIGH (ref 2.5–4.6)

## 2018-10-26 LAB — MAGNESIUM: Magnesium: 2 mg/dL (ref 1.7–2.4)

## 2018-10-26 MED ORDER — SODIUM CHLORIDE 0.9 % IV SOLN: INTRAVENOUS | Status: DC

## 2018-10-26 MED ORDER — HEPARIN SODIUM (PORCINE) 5000 UNIT/ML IJ SOLN
5000.0000 [IU] | Freq: Three times a day (TID) | INTRAMUSCULAR | Status: DC
  Administered 2018-10-26 – 2018-10-27 (×3): 5000 [IU] via SUBCUTANEOUS
  Filled 2018-10-26 (×3): qty 1

## 2018-10-26 MED ORDER — ACETAMINOPHEN 160 MG/5ML PO SOLN
650.0000 mg | ORAL | Status: DC | PRN
  Administered 2018-10-26 – 2018-10-27 (×2): 650 mg via ORAL
  Filled 2018-10-26 (×2): qty 20.3

## 2018-10-26 MED ORDER — SODIUM BICARBONATE 8.4 % IV SOLN
200.0000 meq | Freq: Once | INTRAVENOUS | Status: AC
  Administered 2018-10-26: 06:00:00 200 meq via INTRAVENOUS
  Filled 2018-10-26: qty 50

## 2018-10-26 MED ORDER — HEPARIN SODIUM (PORCINE) 5000 UNIT/ML IJ SOLN: 5000.0000 [IU] | Freq: Three times a day (TID) | INTRAMUSCULAR | Status: DC

## 2018-10-26 MED ORDER — STERILE WATER FOR INJECTION IV SOLN
INTRAVENOUS | Status: DC
  Administered 2018-10-26: 06:00:00 via INTRAVENOUS
  Filled 2018-10-26: qty 850

## 2018-10-26 MED ORDER — SODIUM POLYSTYRENE SULFONATE 15 GM/60ML PO SUSP
60.0000 g | Freq: Once | ORAL | Status: AC
  Administered 2018-10-26: 60 g
  Filled 2018-10-26: qty 240

## 2018-10-26 MED ORDER — FENTANYL BOLUS VIA INFUSION: 5.0000 ug | INTRAVENOUS | Status: DC

## 2018-10-26 MED ORDER — SODIUM BICARBONATE 8.4 % IV SOLN
INTRAVENOUS | Status: AC
  Administered 2018-10-26: 200 meq via INTRAVENOUS
  Filled 2018-10-26: qty 150

## 2018-10-26 NOTE — Consult Note (Addendum)
Consultation Note Date: 10/26/2018   Patient Name: Dawn Cruz  DOB: 10/21/1950  MRN: [id number]  Age / Sex: 68 y.o., female  PCP: Clent Demark, PA-C Referring Physician: Garner Nash, DO  Reason for Consultation: Establishing goals of care, Psychosocial/spiritual support and Withdrawal of life-sustaining treatment  HPI/Patient Profile: 68 y.o. female  with past medical history of cardiomyopathy, coronary artery disease, mitral regurgitation, hypertension, history of V. fib arrest in 2013, congestive heart failure with EF of 10 to 15% admitted on 11/12/2018 in V. fib arrest.  Patient was at work when she collapsed.  She has had 5 minutes downtime with a total of 40 minutes of CPR.  She was intubated and started on pressors and cooling protocol.  Her troponin was 22.82  Consult ordered for goals of care; potential liberation from vent and other aggressive measures  Clinical Assessment and Goals of Care: Patient seen, chart reviewed.  Staffed with Dr. Valeta Harms.  Patient is critically ill and unfortunately despite all efforts seems to be declining and nearing end-of-life.  Critical care medicine has been in contact with patient's son explaining the nature and gravity of his mother's condition.  She is now a DNR.  Patient is intubated, potassium is rising in the setting of worsening kidney function, creatinine 3.52.  Patient has a pro calcitonin level today of 118.05.  Chest x-ray reveals right lower lobe extensive consolidation.  Patient developed metabolic acidosis overnight.  Dr. Valeta Harms spoke to patient's son, Dawn Cruz this morning and explained the gravity of his mother's situation.  At that time she was made a DNR.  Dawn Cruz is very tearful and overwhelmed with the news about his mother.  When I reached out to him he was still  emotionally distraught, crying and could very barely speak.  We are  arranging for he, his stepbrother as well as her father to come to the hospital to say their  goodbyes tonight.  Family will likely withdraw to full comfort care after they have seen their mom tonight.  In the interim she will remain intubated, plan of care unchanged, with care limits of DNR now set  Patient's son, Dawn Cruz is her healthcare proxy.  Dawn Cruz can be reached at [phone]    SUMMARY OF RECOMMENDATIONS   Patient is now DNR per Dr. Juline Patch conversation with patient's son, Harlyn Rathmann I have spoken to Dawn Cruz and plan is for he, half brother Dawn Cruz as well as their father Dawn Cruz to come to the hospital early this evening to see Dawn Cruz and probably withdraw care Palliative medicine to stay involved and help support patient and family Code Status/Advance Care Planning:  DNR    Symptom Management/Withdrawal Recommendations:   Dyspnea secondary to potential withdrawal of ventilator support: Patient now in renal failure with a creatinine of 3.5 to an GFR of only 15.  I am concerned that she will not metabolize morphine very well. Would recommend utilizing Dilaudid continuous infusion 0.5 to 3 mg basal with bolus of 0.5 to  3 mg every 15 minutes as needed to maintain comfort postextubation  Alternate agent Fentanyl gtt 225 mcg-400 mcg/hr and 100 mcg q15 min PRN  But also recommend adding Versed continuous infusion 2 to 10 mg basal and 2 to 10 mg every 15 minutes as needed  Start scheduled Robinul if extubated at 0.4 every 6 hours on a scheduled basis  DC non comfort care related order: DC pressors, abx, blood sugars, insulin, lab draws  Add RN may pronounce death order.  Respiratory therapy order to extubate to [facility] 2L   May use toradol 15 mg q6 PRN fever  Palliative Prophylaxis:   Aspiration, Bowel Regimen, Delirium Protocol, Eye Care, Frequent Pain Assessment, Oral Care and Turn Reposition  Additional Recommendations (Limitations, Scope,  Preferences):  Full Scope Treatment for now.  Family arriving to the hospital later today and probably will withdraw to full comfort care  Psycho-social/Spiritual:   Desire for further Chaplaincy support:no  Additional Recommendations: Grief/Bereavement Support  Prognosis:   Hours - Days family elects to liberate patient from the vent and withdraw pressors.  She is critically ill in the setting of V. fib arrest with 40 minutes of CPR now with pro calcitonin level of 118.05 and kidney failure  Discharge Planning: Anticipated Hospital Death      Primary Diagnoses: Present on Admission: . Cardiac arrest with ventricular fibrillation ([place]) . Cardiac arrest [facility])   I have reviewed the medical record, interviewed the patient and family, and examined the patient. The following aspects are pertinent.  Past Medical History:  Diagnosis Date  . Acute MI ([place])   . Cardiomyopathy, severe by echo with EF 10-20% 09/11/2011  . MR (mitral regurgitation), moderate 09/11/2011   Social History   Socioeconomic History  . Marital status: Divorced    Spouse name: Not on file  . Number of children: Not on file  . Years of education: Not on file  . Highest education level: Not on file  Occupational History  . Not on file  Social Needs  . Financial resource strain: Not on file  . Food insecurity:    Worry: Not on file    Inability: Not on file  . Transportation needs:    Medical: Not on file    Non-medical: Not on file  Tobacco Use  . Smoking status: Current Some Day Smoker    Packs/day: 0.25    Years: 15.00    Pack years: 3.75    Types: Cigarettes  . Smokeless tobacco: Never Used  Substance and Sexual Activity  . Alcohol use: Yes    Alcohol/week: 9.0 standard drinks    Types: 9 Standard drinks or equivalent per week  . Drug use: Yes    Frequency: 1.0 times per week    Types: Cocaine  . Sexual activity: Not Currently    Birth control/protection: Post-menopausal  Lifestyle  .  Physical activity:    Days per week: Not on file    Minutes per session: Not on file  . Stress: Not on file  Relationships  . Social connections:    Talks on phone: Not on file    Gets together: Not on file    Attends religious service: Not on file    Active member of club or organization: Not on file    Attends meetings of clubs or organizations: Not on file    Relationship status: Not on file  Other Topics Concern  . Not on file  Social History Narrative  . Not on file  No family history on file. Scheduled Meds: . artificial tears  1 application Both Eyes G3O  . chlorhexidine gluconate (MEDLINE KIT)  15 mL Mouth Rinse BID  . Chlorhexidine Gluconate Cloth  6 each Topical Daily  . famotidine  20 mg Per Tube QHS  . heparin  5,000 Units Subcutaneous Q8H  . insulin aspart  0-15 Units Subcutaneous Q4H  . mouth rinse  15 mL Mouth Rinse 10 times per day  . sodium chloride flush  10-40 mL Intracatheter Q12H   Continuous Infusions: . sodium chloride Stopped (10/26/18 0603)  . amiodarone 30 mg/hr (10/26/18 0800)  . ampicillin-sulbactam (UNASYN) IV Stopped (10/26/18 0554)  . fentaNYL infusion INTRAVENOUS 100 mcg/hr (10/26/18 0800)  . midazolam Stopped (10/25/18 1830)  . norepinephrine (LEVOPHED) Adult infusion 20 mcg/min (10/26/18 0800)  .  sodium bicarbonate (isotonic) infusion in sterile water 75 mL/hr at 10/26/18 0800  . vasopressin (PITRESSIN) infusion - *FOR SHOCK* 0.04 Units/min (10/26/18 0800)   PRN Meds:.sodium chloride, fentaNYL, midazolam, sodium chloride flush Medications Prior to Admission:  Prior to Admission medications   Medication Sig Start Date End Date Taking? Authorizing Provider  aspirin 81 MG tablet Take 1 tablet (81 mg total) by mouth daily. 02/13/17  Yes Clent Demark, PA-C  amLODipine (NORVASC) 5 MG tablet Take 1 tablet (5 mg total) by mouth daily. 05/22/18   Clent Demark, PA-C  atorvastatin (LIPITOR) 40 MG tablet Take 2 tablets (80 mg total) by  mouth daily. 05/28/18   Clent Demark, PA-C  fluticasone [facility]) 50 MCG/ACT nasal spray Place 2 sprays into both nostrils daily. 07/01/18   Tasia Catchings, Amy V, PA-C  hydrochlorothiazide (HYDRODIURIL) 25 MG tablet Take 1 tablet (25 mg total) by mouth daily. 05/22/18   Clent Demark, PA-C  ipratropium (ATROVENT) 0.06 % nasal spray Place 2 sprays into both nostrils 4 (four) times daily. 07/01/18   Tasia Catchings, Amy V, PA-C  metoprolol succinate (TOPROL XL) 25 MG 24 hr tablet Take 1 tablet (25 mg total) by mouth daily. 05/22/18   Clent Demark, PA-C  ondansetron (ZOFRAN ODT) 4 MG disintegrating tablet Take 1 tablet (4 mg total) by mouth every 8 (eight) hours as needed for nausea or vomiting. Patient not taking: Reported on 11/16/2018 07/01/18   Ok Edwards, PA-C  PAZEO 0.7 % SOLN Place 1 drop into both eyes every morning.  10/12/17   [provider]  varenicline (CHANTIX STARTING MONTH PAK) 0.5 MG X 11 & 1 MG X 42 tablet Take one 0.5 mg tablet by mouth once daily for 3 days, then increase to one 0.5 mg tablet twice daily for 4 days, then increase to one 1 mg tablet twice daily. Patient not taking: Reported on 10/25/2018 05/22/18   Clent Demark, PA-C   No Known Allergies Review of Systems  Unable to perform ROS: Intubated    Physical Exam Vitals signs and nursing note reviewed.  Constitutional:      Comments: Acutely ill-appearing older female; she is intubated, unresponsive, not on any sedation  HENT:     Head: Normocephalic and atraumatic.  Cardiovascular:     Rate and Rhythm: Normal rate.  Pulmonary:     Comments: Intubated Neurological:     Comments: Unable to test Patient unresponsive; not on any sedation  Psychiatric:     Comments: Unable to test Patient critically ill and intubated, unresponsive     Vital Signs: BP 106/70   Pulse 92   Temp (!) 96.1 F (35.6 C) (Esophageal)  Resp (!) 0   Ht _0  (1.626 m)   Wt 68 kg   SpO2 96%   BMI 25.75 kg/m  Pain Scale: CPOT POSS *See  Group Information*: 4-INTERVENTION REQUIRED,Unacceptable,Somnolent, mininal or no response to verbal and physical stimulation Pain Score: 0-No pain   SpO2: SpO2: 96 % O2 Device:SpO2: 96 % O2 Flow Rate: .   IO: Intake/output summary:   Intake/Output Summary (Last 24 hours) at 10/26/2018 1142 Last data filed at 10/26/2018 0800 Gross per 24 hour  Intake 2918.46 ml  Output 190 ml  Net 2728.46 ml    LBM: Last BM Date: 10/23/18 Baseline Weight: Weight: 68 kg Most recent weight: Weight: 68 kg     Palliative Assessment/Data:   Flowsheet Rows     Most Recent Value  Intake Tab  Referral Department  Critical care  Unit at Time of Referral  ICU  Palliative Care Primary Diagnosis  Cardiac  Date Notified  10/25/18  Palliative Care Type  New Palliative care  Reason for referral  Clarify Goals of Care, Psychosocial or Spiritual support, End of Life Care Assistance  Date of Admission  11/17/2018  Date first seen by Palliative Care  10/26/18  # of days Palliative referral response time  1 Day(s)  # of days IP prior to Palliative referral  1  Clinical Assessment  Palliative Performance Scale Score  20%  Pain Max last 24 hours  Not able to report  Pain Min Last 24 hours  Not able to report  Dyspnea Max Last 24 Hours  Not able to report  Dyspnea Min Last 24 hours  Not able to report  Nausea Max Last 24 Hours  Not able to report  Nausea Min Last 24 Hours  Not able to report  Anxiety Max Last 24 Hours  Not able to report  Anxiety Min Last 24 Hours  Not able to report  Other Max Last 24 Hours  Not able to report  Psychosocial & Spiritual Assessment  Palliative Care Outcomes  Patient/Family meeting held?  Yes  Who was at the meeting?  son  Palliative Care Outcomes  Changed CPR status  Palliative Care follow-up planned  Yes, Facility      Time In: 1000 Time Out: 1045 Time Total: 45 min Greater than 50%  of this time was spent counseling and coordinating care related to the above  assessment and plan. Staffed with Dr. Valeta Harms  Signed by: Dory Horn, NP   Please contact Palliative Medicine Team phone at [phone] for questions and concerns.  For individual provider: See Shea Evans

## 2018-10-26 NOTE — Consult Note (Signed)
[facility] Renal Consultation Note  Requesting MD: Icard Indication for Consultation: AKI in the setting of cardiac arrest  HPI:  Dawn Cruz is a 68 y.o. female with hypertension, history of ischemic cardiomyopathy and also a cardiac arrest in 2013-documented ejection fraction of 10 to 15%.  She was admitted to the hospital on 6/5 for witnessed cardiac arrest-5 minutes of downtime and 40 minutes of CPR.  She is felt to have anoxic brain injury as has had no neurologic recovery.  Unfortunately she is also suffered acute kidney injury with complications of metabolic acidosis and hyperkalemia.  Continued hemodynamic instability requiring pressors.  The critical care team felt that support with CVVH would likely not change her outcome given her likely anoxic brain injury.  Family had asked if they had spoken with nephrology so that is the reason for the consult.  Of note, it appears that palliative care has reached out to the family and possibly the plan is for family members to come see her this evening to say goodbye and then withdraw care  Creat  Date/Time Value Ref Range Status  08/11/2013 10:30 AM 0.96 0.50 - 1.10 mg/dL Final   Creatinine, Ser  Date/Time Value Ref Range Status  10/26/2018 12:22 PM 4.07 (H) 0.44 - 1.00 mg/dL Final  16/02/9603 54:09 AM 3.52 (H) 0.44 - 1.00 mg/dL Final  81/19/1478 29:56 PM 3.17 (H) 0.44 - 1.00 mg/dL Final  21/30/8657 84:69 PM 3.04 (H) 0.44 - 1.00 mg/dL Final  62/95/2841 32:44 PM 2.81 (H) 0.44 - 1.00 mg/dL Final  05/23/7251 66:44 PM 2.59 (H) 0.44 - 1.00 mg/dL Final  03/47/4259 56:38 PM 2.42 (H) 0.44 - 1.00 mg/dL Final  75/64/3329 51:88 AM 2.46 (H) 0.44 - 1.00 mg/dL Final  41/66/0630 16:01 AM 2.43 (H) 0.44 - 1.00 mg/dL Final  09/32/3557 32:20 AM 2.07 (H) 0.44 - 1.00 mg/dL Final  25/42/7062 37:62 AM 1.80 (H) 0.44 - 1.00 mg/dL Final  83/15/1761 60:73 PM 1.70 (H) 0.44 - 1.00 mg/dL Final  71/10/2692 85:46 PM 1.60 (H) 0.44 - 1.00 mg/dL Final   27/07/5007 38:18 PM 1.81 (H) 0.44 - 1.00 mg/dL Final  29/93/7169 67:89 PM 1.47 (H) 0.44 - 1.00 mg/dL Final  38/02/1750 02:58 AM 0.91 0.57 - 1.00 mg/dL Final  52/77/8242 35:36 PM 1.00 0.57 - 1.00 mg/dL Final  14/43/1540 08:67 AM 0.89 0.57 - 1.00 mg/dL Final  61/95/0932 67:12 AM 1.45 (H) 0.50 - 1.10 mg/dL Final  45/80/9983 38:25 AM 1.54 (H) 0.50 - 1.10 mg/dL Final  05/39/7673 41:93 AM 1.56 (H) 0.50 - 1.10 mg/dL Final  79/06/4095 35:32 AM 1.95 (H) 0.50 - 1.10 mg/dL Final  99/24/2683 41:96 AM 2.19 (H) 0.50 - 1.10 mg/dL Final  22/29/7989 21:19 AM 1.73 (H) 0.50 - 1.10 mg/dL Final  41/74/0814 48:18 AM 1.59 (H) 0.50 - 1.10 mg/dL Final  56/31/4970 26:37 AM 1.14 (H) 0.50 - 1.10 mg/dL Final  85/88/5027 74:12 AM 1.06 0.50 - 1.10 mg/dL Final  87/86/7672 09:47 PM 1.03 0.50 - 1.10 mg/dL Final  09/62/8366 29:47 PM 0.97 0.50 - 1.10 mg/dL Final  65/46/5035 46:56 AM 0.90 0.50 - 1.10 mg/dL Final  81/27/5170 01:74 AM 0.76 0.50 - 1.10 mg/dL Final  94/49/6759 16:38 PM 0.62 0.50 - 1.10 mg/dL Final  46/65/9935 70:17 PM 0.65 0.50 - 1.10 mg/dL Final  79/39/0300 92:33 PM 0.55 0.50 - 1.10 mg/dL Final  00/76/2263 33:54 PM 0.56 0.50 - 1.10 mg/dL Final  56/25/6389 37:34 AM 0.60 0.50 - 1.10 mg/dL Final  28/76/8115 72:62 AM 0.64  0.50 - 1.10 mg/dL Final  46/96/2952 84:13 AM 0.57 0.50 - 1.10 mg/dL Final  24/40/1027 25:36 AM 0.61 0.50 - 1.10 mg/dL Final  64/40/3474 25:95 PM 0.71 0.50 - 1.10 mg/dL Final  63/87/5643 32:95 PM 0.68 0.50 - 1.10 mg/dL Final  18/84/1660 63:01 PM 0.70 0.50 - 1.10 mg/dL Final  60/02/9322 55:73 PM 0.80 0.50 - 1.10 mg/dL Final  22/06/5425 06:23 PM 0.90 0.50 - 1.10 mg/dL Final  76/28/3151 76:16 PM 1.34 (H) 0.50 - 1.10 mg/dL Final     PMHx:   Past Medical History:  Diagnosis Date  . Acute MI (HCC)   . Cardiomyopathy, severe by echo with EF 10-20% 09/11/2011  . MR (mitral regurgitation), moderate 09/11/2011    No past surgical history on file.  Family Hx: No family history on  file.  Social History:  reports that she has been smoking cigarettes. She has a 3.75 pack-year smoking history. She has never used smokeless tobacco. She reports current alcohol use of about 9.0 standard drinks of alcohol per week. She reports current drug use. Frequency: 1.00 time per week. Drug: Cocaine.  Allergies: No Known Allergies  Medications: Prior to Admission medications   Medication Sig Start Date End Date Taking? Authorizing Provider  aspirin 81 MG tablet Take 1 tablet (81 mg total) by mouth daily. 02/13/17  Yes Loletta Specter, PA-C  amLODipine (NORVASC) 5 MG tablet Take 1 tablet (5 mg total) by mouth daily. 05/22/18   Loletta Specter, PA-C  atorvastatin (LIPITOR) 40 MG tablet Take 2 tablets (80 mg total) by mouth daily. 05/28/18   Loletta Specter, PA-C  fluticasone [facility]) 50 MCG/ACT nasal spray Place 2 sprays into both nostrils daily. 07/01/18   Cathie Hoops, Amy V, PA-C  hydrochlorothiazide (HYDRODIURIL) 25 MG tablet Take 1 tablet (25 mg total) by mouth daily. 05/22/18   Loletta Specter, PA-C  ipratropium (ATROVENT) 0.06 % nasal spray Place 2 sprays into both nostrils 4 (four) times daily. 07/01/18   Cathie Hoops, Amy V, PA-C  metoprolol succinate (TOPROL XL) 25 MG 24 hr tablet Take 1 tablet (25 mg total) by mouth daily. 05/22/18   Loletta Specter, PA-C  ondansetron (ZOFRAN ODT) 4 MG disintegrating tablet Take 1 tablet (4 mg total) by mouth every 8 (eight) hours as needed for nausea or vomiting. Patient not taking: Reported on 11/09/18 07/01/18   Belinda Fisher, PA-C  PAZEO 0.7 % SOLN Place 1 drop into both eyes every morning.  10/12/17   [provider]  varenicline (CHANTIX STARTING MONTH PAK) 0.5 MG X 11 & 1 MG X 42 tablet Take one 0.5 mg tablet by mouth once daily for 3 days, then increase to one 0.5 mg tablet twice daily for 4 days, then increase to one 1 mg tablet twice daily. Patient not taking: Reported on 11-09-18 05/22/18   Loletta Specter, PA-C    I have reviewed the patient's  current medications.  Labs:  Results for orders placed or performed during the hospital encounter of 2018-11-09 (from the past 48 hour(s))  Strep pneumoniae urinary antigen     Status: None   Collection Time: 11/09/2018  2:07 PM  Result Value Ref Range   Strep Pneumo Urinary Antigen NEGATIVE NEGATIVE    Comment:        Infection due to S. pneumoniae cannot be absolutely ruled out since the antigen present may be below the detection limit of the test. Performed at [facility], [address], [place], [place]  [address]   Legionella Pneumophila Serogp 1 Ur Ag     Status: None   Collection Time: 10/23/2018  2:07 PM  Result Value Ref Range   L. pneumophila Serogp 1 Ur Ag Negative Negative    Comment: (NOTE) Presumptive negative for L. pneumophila serogroup 1 antigen in urine, suggesting no recent or current infection. Legionnaires' disease cannot be ruled out since other serogroups and species may also cause disease. Performed At: [facility] [address] [place], [place] [id number] Jolene Schimke MD QI:[id number]    Source of Sample URINE, RANDOM     Comment: Performed at [facility], [address], [place], [place] [address]  I-STAT 7, (LYTES, BLD GAS, ICA, H+H)     Status: Abnormal   Collection Time: 11/08/2018  2:30 PM  Result Value Ref Range   pH, Arterial 7.323 (L) 7.350 - 7.450   pCO2 arterial 31.1 (L) 32.0 - 48.0 mmHg   pO2, Arterial 83.0 83.0 - 108.0 mmHg   Bicarbonate 16.1 (L) 20.0 - 28.0 mmol/L   TCO2 17 (L) 22 - 32 mmol/L   O2 Saturation 95.0 %   Acid-base deficit 9.0 (H) 0.0 - 2.0 mmol/L   Sodium 133 (L) 135 - 145 mmol/L   Potassium 3.3 (L) 3.5 - 5.1 mmol/L   Calcium, Ion 1.33 1.15 - 1.40 mmol/L   HCT 43.0 36.0 - 46.0 %   Hemoglobin 14.6 12.0 - 15.0 g/dL   Patient temperature HIDE    Collection site ARTERIAL LINE    Sample type ARTERIAL   Urine rapid drug screen (hosp performed)     Status: None   Collection Time: 11/14/2018  3:04 PM  Result  Value Ref Range   Opiates NONE DETECTED NONE DETECTED   Cocaine NONE DETECTED NONE DETECTED   Benzodiazepines NONE DETECTED NONE DETECTED   Amphetamines NONE DETECTED NONE DETECTED   Tetrahydrocannabinol NONE DETECTED NONE DETECTED   Barbiturates NONE DETECTED NONE DETECTED    Comment: (NOTE) DRUG SCREEN FOR MEDICAL PURPOSES ONLY.  IF CONFIRMATION IS NEEDED FOR ANY PURPOSE, NOTIFY LAB WITHIN 5 DAYS. LOWEST DETECTABLE LIMITS FOR URINE DRUG SCREEN Drug Class                     Cutoff (ng/mL) Amphetamine and metabolites    1000 Barbiturate and metabolites    200 Benzodiazepine                 200 Tricyclics and metabolites     300 Opiates and metabolites        300 Cocaine and metabolites        300 THC                            50 Performed at [facility], [address] [place], [place] [address]   Urinalysis, Routine w reflex microscopic     Status: Abnormal   Collection Time: 11/14/2018  3:50 PM  Result Value Ref Range   Color, Urine YELLOW YELLOW   APPearance HAZY (A) CLEAR   Specific Gravity, Urine 1.013 1.005 - 1.030   pH 6.0 5.0 - 8.0   Glucose, UA >=500 (A) NEGATIVE mg/dL   Hgb urine dipstick LARGE (A) NEGATIVE   Bilirubin Urine NEGATIVE NEGATIVE   Ketones, ur NEGATIVE NEGATIVE mg/dL   Protein, ur >=536 (A) NEGATIVE mg/dL   Nitrite NEGATIVE NEGATIVE   Leukocytes,Ua NEGATIVE NEGATIVE   RBC / HPF  0-5 0 - 5 RBC/hpf   WBC, UA 0-5 0 - 5 WBC/hpf   Bacteria, UA RARE (A) NONE SEEN   Squamous Epithelial / LPF 0-5 0 - 5   Mucus PRESENT    Hyaline Casts, UA PRESENT    Amorphous Crystal PRESENT     Comment: Performed at [facility], [address] [place], [place] [address]  Troponin I - Now Then Q6H     Status: Abnormal   Collection Time: 10/30/2018  5:19 PM  Result Value Ref Range   Troponin I 18.31 (HH) <0.03 ng/mL    Comment: CRITICAL RESULT CALLED TO, READ BACK BY AND VERIFIED WITHTerri Skains 1857 10/25/2018 WBOND Performed at [facility]  Lab, [address], [place], [place] [address]   Protime-INR now and repeat in 8 hours     Status: Abnormal   Collection Time: 11/09/2018  5:19 PM  Result Value Ref Range   Prothrombin Time 16.6 (H) 11.4 - 15.2 seconds   INR 1.4 (H) 0.8 - 1.2    Comment: (NOTE) INR goal varies based on device and disease states. Performed at [facility], [address] [place], [place] [address]   APTT now and repeat in 8 hours     Status: None   Collection Time: 10/26/2018  5:19 PM  Result Value Ref Range   aPTT 36 24 - 36 seconds    Comment: Performed at [facility], [address], [place], [place] [address]  Magnesium     Status: None   Collection Time: 11/09/2018  5:19 PM  Result Value Ref Range   Magnesium 2.0 1.7 - 2.4 mg/dL    Comment: Performed at [facility], [address] [place], [place] [address]  I-STAT 4, (NA,K, GLUC, HGB,HCT)     Status: Abnormal   Collection Time: 11/07/2018  5:20 PM  Result Value Ref Range   Sodium 136 135 - 145 mmol/L   Potassium 2.7 (LL) 3.5 - 5.1 mmol/L   Glucose, Bld 336 (H) 70 - 99 mg/dL   HCT 13.0 86.5 - 78.4 %   Hemoglobin 13.9 12.0 - 15.0 g/dL   Comment NOTIFIED PHYSICIAN   Troponin I - Now Then Q6H     Status: Abnormal   Collection Time: 11/08/2018  7:59 PM  Result Value Ref Range   Troponin I 30.19 (HH) <0.03 ng/mL    Comment: CRITICAL VALUE NOTED.  VALUE IS CONSISTENT WITH PREVIOUSLY REPORTED AND CALLED VALUE. Performed at [facility], [address], [place], [place] [address]   Basic metabolic panel     Status: Abnormal   Collection Time: 10/31/2018  7:59 PM  Result Value Ref Range   Sodium 135 135 - 145 mmol/L   Potassium 2.4 (LL) 3.5 - 5.1 mmol/L    Comment: CRITICAL RESULT CALLED TO, READ BACK BY AND VERIFIED WITH: COBURN M,RN 11/12/2018 2106 WAYK    Chloride 102 98 - 111 mmol/L   CO2 19 (L) 22 - 32 mmol/L   Glucose, Bld 240 (H) 70 - 99 mg/dL   BUN 20 8 - 23 mg/dL   Creatinine, Ser 5.28 (H) 0.44 - 1.00 mg/dL   Calcium  8.9 8.9 - 41.3 mg/dL    Comment: DELTA CHECK NOTED   GFR calc non Af Amer 29 (L) >60 mL/min   GFR calc Af Amer 33 (L) >60 mL/min   Anion gap 14 5 - 15    Comment: Performed  at [facility], [place] [address] [place], [place] [address]  Glucose, capillary     Status: Abnormal   Collection Time: 10/28/2018  8:05 PM  Result Value Ref Range   Glucose-Capillary 227 (H) 70 - 99 mg/dL  I-STAT, chem 8     Status: Abnormal   Collection Time: 11/15/2018  8:10 PM  Result Value Ref Range   Sodium 137 135 - 145 mmol/L   Potassium 2.4 (LL) 3.5 - 5.1 mmol/L   Chloride 101 98 - 111 mmol/L   BUN 21 8 - 23 mg/dL   Creatinine, Ser 1.60 (H) 0.44 - 1.00 mg/dL   Glucose, Bld 241 (H) 70 - 99 mg/dL   Calcium, Ion 1.32 1.15 - 1.40 mmol/L   TCO2 20 (L) 22 - 32 mmol/L   Hemoglobin 14.3 12.0 - 15.0 g/dL   HCT 42.0 36.0 - 46.0 %   Comment NOTIFIED PHYSICIAN   MRSA PCR Screening     Status: None   Collection Time: 11/14/2018  8:25 PM  Result Value Ref Range   MRSA by PCR NEGATIVE NEGATIVE    Comment:        The GeneXpert MRSA Assay (FDA approved for NASAL specimens only), is one component of a comprehensive MRSA colonization surveillance program. It is not intended to diagnose MRSA infection nor to guide or monitor treatment for MRSA infections. Performed at [facility], [place] [address] [place], [place] [address]   Glucose, capillary     Status: Abnormal   Collection Time: 10/20/2018  9:13 PM  Result Value Ref Range   Glucose-Capillary 233 (H) 70 - 99 mg/dL  Protime-INR now and repeat in 8 hours     Status: Abnormal   Collection Time: 11/14/2018  9:14 PM  Result Value Ref Range   Prothrombin Time 16.3 (H) 11.4 - 15.2 seconds   INR 1.3 (H) 0.8 - 1.2    Comment: (NOTE) INR goal varies based on device and disease states. Performed at [facility], [place] [address] [place], [place] [address]   APTT now and repeat in 8 hours     Status: Abnormal   Collection Time: 11/10/2018  9:14 PM  Result  Value Ref Range   aPTT 37 (H) 24 - 36 seconds    Comment:        IF BASELINE aPTT IS ELEVATED, SUGGEST PATIENT RISK ASSESSMENT BE USED TO DETERMINE APPROPRIATE ANTICOAGULANT THERAPY. Performed at [facility], [place] [address], [place],  [address]   Procalcitonin - Baseline     Status: None   Collection Time: 11/10/2018  9:14 PM  Result Value Ref Range   Procalcitonin 56.34 ng/mL    Comment:        Interpretation: PCT >= 10 ng/mL: Important systemic inflammatory response, almost exclusively due to severe bacterial sepsis or septic shock. (NOTE)       Sepsis PCT Algorithm           Lower Respiratory Tract                                      Infection PCT Algorithm    ----------------------------     ----------------------------         PCT < 0.25 ng/mL                PCT < 0.10 ng/mL  Strongly encourage             Strongly discourage   discontinuation of antibiotics    initiation of antibiotics    ----------------------------     -----------------------------       PCT 0.25 - 0.50 ng/mL            PCT 0.10 - 0.25 ng/mL               OR       >80% decrease in PCT            Discourage initiation of                                            antibiotics      Encourage discontinuation           of antibiotics    ----------------------------     -----------------------------         PCT >= 0.50 ng/mL              PCT 0.26 - 0.50 ng/mL                AND       <80% decrease in PCT             Encourage initiation of                                             antibiotics       Encourage continuation           of antibiotics    ----------------------------     -----------------------------        PCT >= 0.50 ng/mL                  PCT > 0.50 ng/mL               AND         increase in PCT                  Strongly encourage                                      initiation of antibiotics    Strongly encourage escalation           of antibiotics                                      -----------------------------                                           PCT <= 0.25 ng/mL                                                 OR                                        >  80% decrease in PCT                                     Discontinue / Do not initiate                                             antibiotics Performed at [facility], [address], [place], [place] [address]   Lactic acid, plasma     Status: Abnormal   Collection Time: 11/18/2018  9:14 PM  Result Value Ref Range   Lactic Acid, Venous 7.3 (HH) 0.5 - 1.9 mmol/L    Comment: CRITICAL RESULT CALLED TO, READ BACK BY AND VERIFIED WITH: CURRIN A,RN 11/18/18 2155 WAYK Performed at [facility], [address], [place], [place] [address]   Beta-hydroxybutyric acid     Status: None   Collection Time: November 18, 2018  9:14 PM  Result Value Ref Range   Beta-Hydroxybutyric Acid 0.14 0.05 - 0.27 mmol/L    Comment: Performed at [facility], [address], [place], [place] [address]  Hemoglobin A1c     Status: None   Collection Time: 11/18/2018  9:14 PM  Result Value Ref Range   Hgb A1c MFr Bld 4.9 4.8 - 5.6 %    Comment: (NOTE) Pre diabetes:          5.7%-6.4% Diabetes:              >6.4% Glycemic control for   <7.0% adults with diabetes    Mean Plasma Glucose 93.93 mg/dL    Comment: Performed at [facility], [address] [place], [place] [address]  Glucose, capillary     Status: Abnormal   Collection Time: 11-18-18 10:06 PM  Result Value Ref Range   Glucose-Capillary 214 (H) 70 - 99 mg/dL  Glucose, capillary     Status: Abnormal   Collection Time: 11-18-18 11:22 PM  Result Value Ref Range   Glucose-Capillary 192 (H) 70 - 99 mg/dL  I-STAT, chem 8     Status: Abnormal   Collection Time: 11-18-18 11:27 PM  Result Value Ref Range   Sodium 138 135 - 145 mmol/L   Potassium 2.5 (LL) 3.5 - 5.1 mmol/L   Chloride 104 98 - 111 mmol/L   BUN 21 8 - 23 mg/dL   Creatinine, Ser  2.95 (H) 0.44 - 1.00 mg/dL   Glucose, Bld 621 (H) 70 - 99 mg/dL   Calcium, Ion 3.08 6.57 - 1.40 mmol/L   TCO2 18 (L) 22 - 32 mmol/L   Hemoglobin 13.9 12.0 - 15.0 g/dL   HCT 84.6 96.2 - 95.2 %   Comment NOTIFIED PHYSICIAN   Glucose, capillary     Status: Abnormal   Collection Time: 10/25/18 12:06 AM  Result Value Ref Range   Glucose-Capillary 173 (H) 70 - 99 mg/dL  Glucose, capillary     Status: Abnormal   Collection Time: 10/25/18  1:05 AM  Result Value Ref Range   Glucose-Capillary 132 (H) 70 - 99 mg/dL  Troponin I - Now Then Q6H     Status: Abnormal   Collection Time: 10/25/18  2:08 AM  Result Value Ref Range   Troponin I 28.47 (HH) <0.03 ng/mL    Comment: CRITICAL VALUE NOTED.  VALUE IS CONSISTENT  WITH PREVIOUSLY REPORTED AND CALLED VALUE. Performed at [facility], [address] [place], [place] [address]   Glucose, capillary     Status: Abnormal   Collection Time: 10/25/18  3:22 AM  Result Value Ref Range   Glucose-Capillary 151 (H) 70 - 99 mg/dL  I-STAT, chem 8     Status: Abnormal   Collection Time: 10/25/18  3:25 AM  Result Value Ref Range   Sodium 138 135 - 145 mmol/L   Potassium 3.2 (L) 3.5 - 5.1 mmol/L   Chloride 106 98 - 111 mmol/L   BUN 23 8 - 23 mg/dL   Creatinine, Ser 6.041.80 (H) 0.44 - 1.00 mg/dL   Glucose, Bld 540150 (H) 70 - 99 mg/dL   Calcium, Ion 9.811.24 1.911.15 - 1.40 mmol/L   TCO2 17 (L) 22 - 32 mmol/L   Hemoglobin 13.3 12.0 - 15.0 g/dL   HCT 47.839.0 29.536.0 - 62.146.0 %  CBC     Status: Abnormal   Collection Time: 10/25/18  5:07 AM  Result Value Ref Range   WBC 20.7 (H) 4.0 - 10.5 K/uL   RBC 3.54 (L) 3.87 - 5.11 MIL/uL   Hemoglobin 12.8 12.0 - 15.0 g/dL   HCT 30.838.5 65.736.0 - 84.646.0 %   MCV 108.8 (H) 80.0 - 100.0 fL    Comment: REPEATED TO VERIFY DELTA CHECK NOTED DUE TO CHANGE IN GLUCOSE    MCH 36.2 (H) 26.0 - 34.0 pg   MCHC 33.2 30.0 - 36.0 g/dL   RDW 96.212.3 95.211.5 - 84.115.5 %   Platelets 209 150 - 400 K/uL   nRBC 0.1 0.0 - 0.2 %    Comment: Performed at [facility], [address] [place], [place] [address]  Basic metabolic panel     Status: Abnormal   Collection Time: 10/25/18  5:07 AM  Result Value Ref Range   Sodium 139 135 - 145 mmol/L   Potassium 3.4 (L) 3.5 - 5.1 mmol/L   Chloride 108 98 - 111 mmol/L   CO2 15 (L) 22 - 32 mmol/L   Glucose, Bld 175 (H) 70 - 99 mg/dL   BUN 23 8 - 23 mg/dL   Creatinine, Ser 1.022.07 (H) 0.44 - 1.00 mg/dL   Calcium 8.3 (L) 8.9 - 10.3 mg/dL   GFR calc non Af Amer 24 (L) >60 mL/min   GFR calc Af Amer 28 (L) >60 mL/min   Anion gap 16 (H) 5 - 15    Comment: Performed at [facility], [address] [place], [place] [address]  Magnesium     Status: None   Collection Time: 10/25/18  5:07 AM  Result Value Ref Range   Magnesium 1.8 1.7 - 2.4 mg/dL    Comment: Performed at [facility], [address] [place], [place] [address]  Phosphorus     Status: None   Collection Time: 10/25/18  5:07 AM  Result Value Ref Range   Phosphorus 3.0 2.5 - 4.6 mg/dL    Comment: Performed at [facility], [address] [place], [place] [address]  Procalcitonin     Status: None   Collection Time: 10/25/18  5:07 AM  Result Value Ref Range   Procalcitonin 84.70 ng/mL    Comment:        Interpretation: PCT >= 10 ng/mL: Important systemic inflammatory response, almost exclusively due to severe bacterial sepsis or septic shock. (NOTE)       Sepsis PCT Algorithm  Lower Respiratory Tract                                      Infection PCT Algorithm    ----------------------------     ----------------------------         PCT < 0.25 ng/mL                PCT < 0.10 ng/mL         Strongly encourage             Strongly discourage   discontinuation of antibiotics    initiation of antibiotics    ----------------------------     -----------------------------       PCT 0.25 - 0.50 ng/mL            PCT 0.10 - 0.25 ng/mL               OR       >80% decrease in PCT            Discourage initiation of                                             antibiotics      Encourage discontinuation           of antibiotics    ----------------------------     -----------------------------         PCT >= 0.50 ng/mL              PCT 0.26 - 0.50 ng/mL                AND       <80% decrease in PCT             Encourage initiation of                                             antibiotics       Encourage continuation           of antibiotics    ----------------------------     -----------------------------        PCT >= 0.50 ng/mL                  PCT > 0.50 ng/mL               AND         increase in PCT                  Strongly encourage                                      initiation of antibiotics    Strongly encourage escalation           of antibiotics                                     -----------------------------  PCT <= 0.25 ng/mL                                                 OR                                        > 80% decrease in PCT                                     Discontinue / Do not initiate                                             antibiotics Performed at [facility], [address], [place], [place] [address]   Lactic acid, plasma     Status: Abnormal   Collection Time: 10/25/18  5:07 AM  Result Value Ref Range   Lactic Acid, Venous 6.3 (HH) 0.5 - 1.9 mmol/L    Comment: CRITICAL RESULT CALLED TO, READ BACK BY AND VERIFIED WITH: COBURN M,RN 10/25/18 0548 WAYK Performed at [facility], [address], [place], [place] [address]   Protime-INR     Status: Abnormal   Collection Time: 10/25/18  5:07 AM  Result Value Ref Range   Prothrombin Time 15.5 (H) 11.4 - 15.2 seconds   INR 1.2 0.8 - 1.2    Comment: (NOTE) INR goal varies based on device and disease states. Performed at [facility], [address], [place], [place] [address]   APTT     Status: Abnormal   Collection Time: 10/25/18  5:07 AM  Result  Value Ref Range   aPTT 39 (H) 24 - 36 seconds    Comment:        IF BASELINE aPTT IS ELEVATED, SUGGEST PATIENT RISK ASSESSMENT BE USED TO DETERMINE APPROPRIATE ANTICOAGULANT THERAPY. Performed at [place] [facility], [address] [place], [place] [address]   Glucose, capillary     Status: Abnormal   Collection Time: 10/25/18  5:08 AM  Result Value Ref Range   Glucose-Capillary 170 (H) 70 - 99 mg/dL  I-STAT 7, (LYTES, BLD GAS, ICA, H+H)     Status: Abnormal   Collection Time: 10/25/18  5:12 AM  Result Value Ref Range   pH, Arterial 7.304 (L) 7.350 - 7.450   pCO2 arterial 26.9 (L) 32.0 - 48.0 mmHg   pO2, Arterial 73.0 (L) 83.0 - 108.0 mmHg   Bicarbonate 14.5 (L) 20.0 - 28.0 mmol/L   TCO2 16 (L) 22 - 32 mmol/L   O2 Saturation 97.0 %   Acid-base deficit 12.0 (H) 0.0 - 2.0 mmol/L   Sodium 138 135 - 145 mmol/L   Potassium 3.4 (L) 3.5 - 5.1 mmol/L   Calcium, Ion 1.21 1.15 - 1.40 mmol/L   HCT 36.0 36.0 - 46.0 %   Hemoglobin 12.2 12.0 - 15.0 g/dL   Patient temperature 21.3 C    Sample type ARTERIAL   Glucose, capillary     Status: Abnormal   Collection Time: 10/25/18  6:57 AM  Result Value Ref Range  Glucose-Capillary 179 (H) 70 - 99 mg/dL  Troponin I - Now Then Q6H     Status: Abnormal   Collection Time: 10/25/18  8:24 AM  Result Value Ref Range   Troponin I 22.82 (HH) <0.03 ng/mL    Comment: CRITICAL VALUE NOTED.  VALUE IS CONSISTENT WITH PREVIOUSLY REPORTED AND CALLED VALUE. Performed at [facility], [address], [place], [place] [address]   Basic metabolic panel     Status: Abnormal   Collection Time: 10/25/18  8:24 AM  Result Value Ref Range   Sodium 137 135 - 145 mmol/L   Potassium 6.0 (H) 3.5 - 5.1 mmol/L    Comment: NO VISIBLE HEMOLYSIS   Chloride 106 98 - 111 mmol/L   CO2 16 (L) 22 - 32 mmol/L   Glucose, Bld 240 (H) 70 - 99 mg/dL   BUN 23 8 - 23 mg/dL   Creatinine, Ser 1.30 (H) 0.44 - 1.00 mg/dL   Calcium 8.0 (L) 8.9 - 10.3 mg/dL   GFR calc non Af  Amer 20 (L) >60 mL/min   GFR calc Af Amer 23 (L) >60 mL/min   Anion gap 15 5 - 15    Comment: Performed at [facility], [address], [place], [place] [address]  Glucose, capillary     Status: Abnormal   Collection Time: 10/25/18  9:18 AM  Result Value Ref Range   Glucose-Capillary 232 (H) 70 - 99 mg/dL  Culture, respiratory (non-expectorated)     Status: None (Preliminary result)   Collection Time: 10/25/18  9:51 AM  Result Value Ref Range   Specimen Description TRACHEAL ASPIRATE    Special Requests NONE    Gram Stain      RARE WBC PRESENT, PREDOMINANTLY PMN RARE GRAM POSITIVE RODS    Culture      CULTURE REINCUBATED FOR BETTER GROWTH Performed at [facility], [address], [place], [place] [address]    Report Status PENDING   Potassium     Status: None   Collection Time: 10/25/18 10:19 AM  Result Value Ref Range   Potassium 4.9 3.5 - 5.1 mmol/L    Comment: Performed at [facility], [address] [place], [place] [address]  Basic metabolic panel     Status: Abnormal   Collection Time: 10/25/18 11:02 AM  Result Value Ref Range   Sodium 136 135 - 145 mmol/L   Potassium 4.2 3.5 - 5.1 mmol/L   Chloride 108 98 - 111 mmol/L   CO2 13 (L) 22 - 32 mmol/L   Glucose, Bld 224 (H) 70 - 99 mg/dL   BUN 25 (H) 8 - 23 mg/dL   Creatinine, Ser 1.32 (H) 0.44 - 1.00 mg/dL   Calcium 7.9 (L) 8.9 - 10.3 mg/dL   GFR calc non Af Amer 20 (L) >60 mL/min   GFR calc Af Amer 23 (L) >60 mL/min   Anion gap 15 5 - 15    Comment: Performed at [facility], [address], [place], [place] [address]  Glucose, capillary     Status: Abnormal   Collection Time: 10/25/18 11:48 AM  Result Value Ref Range   Glucose-Capillary 194 (H) 70 - 99 mg/dL  Basic metabolic panel     Status: Abnormal   Collection Time: 10/25/18  1:51 PM  Result Value Ref Range   Sodium 137 135 - 145 mmol/L   Potassium 4.0 3.5 - 5.1 mmol/L   Chloride 111 98 -  111 mmol/L   CO2 13 (L) 22 - 32 mmol/L    Glucose, Bld 199 (H) 70 - 99 mg/dL   BUN 25 (H) 8 - 23 mg/dL   Creatinine, Ser 1.61 (H) 0.44 - 1.00 mg/dL   Calcium 7.8 (L) 8.9 - 10.3 mg/dL   GFR calc non Af Amer 20 (L) >60 mL/min   GFR calc Af Amer 23 (L) >60 mL/min   Anion gap 13 5 - 15    Comment: Performed at [facility], [address] [place], [place] [address]  Glucose, capillary     Status: Abnormal   Collection Time: 10/25/18  4:20 PM  Result Value Ref Range   Glucose-Capillary 154 (H) 70 - 99 mg/dL  Basic metabolic panel     Status: Abnormal   Collection Time: 10/25/18  4:23 PM  Result Value Ref Range   Sodium 138 135 - 145 mmol/L   Potassium 5.5 (H) 3.5 - 5.1 mmol/L    Comment: DELTA CHECK NOTED SLIGHT HEMOLYSIS    Chloride 110 98 - 111 mmol/L   CO2 15 (L) 22 - 32 mmol/L   Glucose, Bld 161 (H) 70 - 99 mg/dL   BUN 27 (H) 8 - 23 mg/dL   Creatinine, Ser 5.40 (H) 0.44 - 1.00 mg/dL   Calcium 7.8 (L) 8.9 - 10.3 mg/dL   GFR calc non Af Amer 19 (L) >60 mL/min   GFR calc Af Amer 22 (L) >60 mL/min   Anion gap 13 5 - 15    Comment: Performed at [facility], [address] [place], [place] [address]  Basic metabolic panel     Status: Abnormal   Collection Time: 10/25/18  6:36 PM  Result Value Ref Range   Sodium 134 (L) 135 - 145 mmol/L   Potassium 5.5 (H) 3.5 - 5.1 mmol/L   Chloride 110 98 - 111 mmol/L   CO2 16 (L) 22 - 32 mmol/L   Glucose, Bld 134 (H) 70 - 99 mg/dL   BUN 28 (H) 8 - 23 mg/dL   Creatinine, Ser 1.47 (H) 0.44 - 1.00 mg/dL   Calcium 7.5 (L) 8.9 - 10.3 mg/dL   GFR calc non Af Amer 17 (L) >60 mL/min   GFR calc Af Amer 19 (L) >60 mL/min   Anion gap 8 5 - 15    Comment: Performed at [facility], [address], [place], [place] [address]  Basic metabolic panel     Status: Abnormal   Collection Time: 10/25/18  8:42 PM  Result Value Ref Range   Sodium 136 135 - 145 mmol/L   Potassium 4.8 3.5 - 5.1 mmol/L   Chloride 107 98 - 111 mmol/L   CO2 17 (L) 22 - 32 mmol/L   Glucose, Bld 166 (H)  70 - 99 mg/dL   BUN 27 (H) 8 - 23 mg/dL   Creatinine, Ser 2.13 (H) 0.44 - 1.00 mg/dL   Calcium 7.6 (L) 8.9 - 10.3 mg/dL   GFR calc non Af Amer 15 (L) >60 mL/min   GFR calc Af Amer 18 (L) >60 mL/min   Anion gap 12 5 - 15    Comment: Performed at [facility], [address], [place], [place] [address]  Glucose, capillary     Status: Abnormal   Collection Time: 10/25/18  8:52 PM  Result Value Ref Range   Glucose-Capillary 161 (H) 70 - 99 mg/dL  Basic metabolic panel  Status: Abnormal   Collection Time: 10/25/18 11:00 PM  Result Value Ref Range   Sodium 136 135 - 145 mmol/L   Potassium 6.4 (HH) 3.5 - 5.1 mmol/L    Comment: NO VISIBLE HEMOLYSIS CRITICAL RESULT CALLED TO, READ BACK BY AND VERIFIED WITH: COBURN M,RN 10/25/18 2350 WAYK    Chloride 106 98 - 111 mmol/L   CO2 14 (L) 22 - 32 mmol/L   Glucose, Bld 171 (H) 70 - 99 mg/dL   BUN 29 (H) 8 - 23 mg/dL   Creatinine, Ser 1.61 (H) 0.44 - 1.00 mg/dL   Calcium 7.7 (L) 8.9 - 10.3 mg/dL   GFR calc non Af Amer 15 (L) >60 mL/min   GFR calc Af Amer 17 (L) >60 mL/min   Anion gap 16 (H) 5 - 15    Comment: Performed at [facility], [address] [place], [place] [address]  Glucose, capillary     Status: Abnormal   Collection Time: 10/25/18 11:08 PM  Result Value Ref Range   Glucose-Capillary 166 (H) 70 - 99 mg/dL  Glucose, capillary     Status: Abnormal   Collection Time: 10/26/18  4:43 AM  Result Value Ref Range   Glucose-Capillary 108 (H) 70 - 99 mg/dL  I-STAT 7, (LYTES, BLD GAS, ICA, H+H)     Status: Abnormal   Collection Time: 10/26/18  4:45 AM  Result Value Ref Range   pH, Arterial 7.164 (LL) 7.350 - 7.450   pCO2 arterial 24.5 (L) 32.0 - 48.0 mmHg   pO2, Arterial 88.0 83.0 - 108.0 mmHg   Bicarbonate 9.1 (L) 20.0 - 28.0 mmol/L   TCO2 10 (L) 22 - 32 mmol/L   O2 Saturation 95.0 %   Acid-base deficit 19.0 (H) 0.0 - 2.0 mmol/L   Sodium 138 135 - 145 mmol/L   Potassium 5.8 (H) 3.5 - 5.1 mmol/L   Calcium, Ion 1.06  (L) 1.15 - 1.40 mmol/L   HCT 35.0 (L) 36.0 - 46.0 %   Hemoglobin 11.9 (L) 12.0 - 15.0 g/dL   Patient temperature 54.0 C    Sample type ARTERIAL    Comment NOTIFIED PHYSICIAN   Procalcitonin     Status: None   Collection Time: 10/26/18  4:49 AM  Result Value Ref Range   Procalcitonin 118.05 ng/mL    Comment:        Interpretation: PCT >= 10 ng/mL: Important systemic inflammatory response, almost exclusively due to severe bacterial sepsis or septic shock. (NOTE)       Sepsis PCT Algorithm           Lower Respiratory Tract                                      Infection PCT Algorithm    ----------------------------     ----------------------------         PCT < 0.25 ng/mL                PCT < 0.10 ng/mL         Strongly encourage             Strongly discourage   discontinuation of antibiotics    initiation of antibiotics    ----------------------------     -----------------------------       PCT 0.25 - 0.50 ng/mL            PCT 0.10 - 0.25  ng/mL               OR       >80% decrease in PCT            Discourage initiation of                                            antibiotics      Encourage discontinuation           of antibiotics    ----------------------------     -----------------------------         PCT >= 0.50 ng/mL              PCT 0.26 - 0.50 ng/mL                AND       <80% decrease in PCT             Encourage initiation of                                             antibiotics       Encourage continuation           of antibiotics    ----------------------------     -----------------------------        PCT >= 0.50 ng/mL                  PCT > 0.50 ng/mL               AND         increase in PCT                  Strongly encourage                                      initiation of antibiotics    Strongly encourage escalation           of antibiotics                                     -----------------------------                                           PCT  <= 0.25 ng/mL                                                 OR                                        > 80% decrease in PCT  Discontinue / Do not initiate                                             antibiotics Performed at [facility], [address], [place], [place] [address]   CBC     Status: Abnormal   Collection Time: 10/26/18  4:49 AM  Result Value Ref Range   WBC 18.3 (H) 4.0 - 10.5 K/uL   RBC 3.22 (L) 3.87 - 5.11 MIL/uL   Hemoglobin 11.9 (L) 12.0 - 15.0 g/dL   HCT 60.4 54.0 - 98.1 %   MCV 113.7 (H) 80.0 - 100.0 fL   MCH 37.0 (H) 26.0 - 34.0 pg   MCHC 32.5 30.0 - 36.0 g/dL   RDW 19.1 47.8 - 29.5 %   Platelets 194 150 - 400 K/uL   nRBC 0.3 (H) 0.0 - 0.2 %    Comment: Performed at [facility], [address], [place], [place] [address]  Basic metabolic panel     Status: Abnormal   Collection Time: 10/26/18  4:49 AM  Result Value Ref Range   Sodium 140 135 - 145 mmol/L   Potassium 5.9 (H) 3.5 - 5.1 mmol/L   Chloride 109 98 - 111 mmol/L   CO2 10 (L) 22 - 32 mmol/L   Glucose, Bld 136 (H) 70 - 99 mg/dL   BUN 31 (H) 8 - 23 mg/dL   Creatinine, Ser 8.65 (H) 0.44 - 1.00 mg/dL   Calcium 7.7 (L) 8.9 - 10.3 mg/dL   GFR calc non Af Amer 13 (L) >60 mL/min   GFR calc Af Amer 15 (L) >60 mL/min   Anion gap 21 (H) 5 - 15    Comment: Performed at [facility], [address], [place], [place] [address]  Magnesium     Status: None   Collection Time: 10/26/18  4:49 AM  Result Value Ref Range   Magnesium 2.0 1.7 - 2.4 mg/dL    Comment: Performed at [facility], [address], [place], [place] [address]  Phosphorus     Status: Abnormal   Collection Time: 10/26/18  4:49 AM  Result Value Ref Range   Phosphorus 7.2 (H) 2.5 - 4.6 mg/dL    Comment: Performed at [facility], [address] [place], [place] [address]  Glucose, capillary     Status: None   Collection Time: 10/26/18  8:21 AM  Result Value Ref Range    Glucose-Capillary 95 70 - 99 mg/dL  Glucose, capillary     Status: None   Collection Time: 10/26/18 11:10 AM  Result Value Ref Range   Glucose-Capillary 87 70 - 99 mg/dL  Basic metabolic panel     Status: Abnormal   Collection Time: 10/26/18 12:22 PM  Result Value Ref Range   Sodium 142 135 - 145 mmol/L   Potassium 5.9 (H) 3.5 - 5.1 mmol/L   Chloride 104 98 - 111 mmol/L   CO2 14 (L) 22 - 32 mmol/L   Glucose, Bld 130 (H) 70 - 99 mg/dL   BUN 37 (H) 8 - 23 mg/dL   Creatinine, Ser 4.40 (H) 0.44 - 1.00 mg/dL   Calcium 7.4 (L) 8.9 - 10.3 mg/dL   GFR calc non Af Amer 11 (L) >60 mL/min   GFR calc Af Amer 12 (L) >60 mL/min  Anion gap 24 (H) 5 - 15    Comment: CONSISTENT WITH PREVIOUS RESULT Performed at [facility], [address] [place], [place] [id number]      ROS:  Review of systems not obtained due to patient factors.  Physical Exam: Vitals:   10/26/18 1230 10/26/18 1245  BP:    Pulse:    Resp: (!) 28 (!) 23  Temp:    SpO2:       General: Obese black female,  on vent-warming blanket HEENT: Pupils reactive Neck: Difficult to tell JVD Heart: Regular rate and rhythm Lungs: Ventilated, coarse breath sounds bilaterally Abdomen: Obese, soft Extremities: Pitting edema Skin: Warm and dry Neuro: Unresponsive  Assessment/Plan: 68 year old black female with significant heart disease.  Status post cardiac arrest with prolonged resuscitation and now anoxic brain injury complications of acute kidney injury 1.Renal-acute kidney injury in the setting of cardiac arrest.  Unfortunately, I agree with other disciplines that this is likely a terminal event.  Initiation of CRRT at this time would not change her eventual outcome and I do not recommend it as it is only prolonging the inevitable.  Renal will sign off at this time.  Call with questions   Cecille AverKellie A Fairley Copher 10/26/2018, 2:04 PM

## 2018-10-26 NOTE — Progress Notes (Signed)
NAME:  Dawn Cruz, MRN:  [id number], DOB:  07-13-1950, LOS: 2 ADMISSION DATE:  11-04-2018, CONSULTATION DATE:  6/5  REFERRING MD: Dr. Tyrone Nine EDP, CHIEF COMPLAINT:   Cardiac arrest  Brief History   68 year old female witnessed VF arrest with quick bystander CPR. 30-45 minute duration off and on.   History of present illness    68 year old female with prior history of prior cardiac arrest, MI, and HFrEF 10% in 2013. No other history available in  EMR. She is supposed to be on HCTZ, Norvasc, and toprol, however she last filled these 4 months ago for 90 days worth. Her PCP felt as though she had not been compliant. She smokes 3 cigarettes per day and drinks two fifths of liquor per week. She was at work in her usual state of health when she collapsed in the early PM hours of 6/5. Collapse was witnessed and EMS was called right away. Reports of bystander CPR. EMS responded in approximately 5 mins. Initial rhythm VF. Patient underwent ACLS with defibrillation for entire trip to ED. Continued to be in VF for first 30 mins in ED with brief periods of ROSC. She was intubated, CVL placed, and treated with amiodarone and norepinephrine infusions in addition to ACLS with multiple defibrillations. Once ROSC achieved with relative stability, PCCM has been asked to admit.  Past Medical History   has a past medical history of Acute MI ([facility]), Cardiomyopathy, severe by echo with EF 10-20% (09/11/2011), and MR (mitral regurgitation), moderate (09/11/2011).   Significant Hospital Events   6/5 admit for cardiac arrest  Consults:  Cardiology  Procedures:  ETT 6/5 > CVL 6/5 >  Significant Diagnostic Tests:  CT head 6/5 > Echo 6/5 > EEG 6/5 >  Micro Data:  COVID-19 5/6 > negative  Blood Cx 6/5 > Urine 6/5 > Tracheal aspirate 6/5 >  Antimicrobials:  Unasyn 6/5 >  Interim history/subjective:  Intubated sedated on mechanical ventilation.  Remains on vasopressors.  Unresponsive to pain or voice.   Objective   Blood pressure 106/70, pulse 92, temperature 99 F (37.2 C), temperature source Bladder, resp. rate (!) 0, height 5\' 4"  (1.626 m), weight 68 kg, SpO2 96 %. CVP:  [0 mmHg-11 mmHg] 11 mmHg  Vent Mode: PRVC FiO2 (%):  [40 %] 40 % Set Rate:  [18 bmp] 18 bmp Vt Set:  [440 mL-4480 mL] 440 mL PEEP:  [8 cmH20] 8 cmH20 Plateau Pressure:  [12 cmH20-19 cmH20] 19 cmH20   Intake/Output Summary (Last 24 hours) at 10/26/2018 0915 Last data filed at 10/26/2018 0800 Gross per 24 hour  Intake 3244.28 ml  Output 190 ml  Net 3054.28 ml   Filed Weights   November 04, 2018 1253  Weight: 68 kg    Examination: General: Elderly female, intubated on mechanical ventilation, cooling protocol pads in place HENT: Endo tracheal tube in place, pupils reactive Lungs: Bilateral breath sounds, bilateral ventilated breath sounds Cardiovascular: Tachycardic, regular, no MRG Abdomen: Soft, nontender nondistended Extremities: No significant edema Neuro: Unresponsive to pain, sedated GU: Foley in place  Resolved Hospital Problem list     Assessment & Plan:   Cardiac arrest: VF arrest, prolonged downtime.  Patient has had progressive cardiogenic shock. Patient has been rewarmed. Pads are still remained in place. Remains in intensive care unit for close hemodynamic respiratory support. Discussed overall prognosis and multiorgan failure with the patient's son Sonia Side via the phone.  At this time has been made a durable DNR.  Cardiogenic shock  Acute on chronic HFrEF Weaned from norepinephrine, epinephrine has been stopped, continue vasopressin Goal mean arterial pressure greater than 65.  Overall outcome from her multiorgan failure post cardiac arrest is guarded.  I do suspect that she will pass from this disease during this hospitalization.  Acute hypoxemic respiratory failure requiring intubation mechanical ventilation Right upper lobe infiltrate Chest x-ray with right upper lobe infiltrate Continue Unasyn  for aspiration pneumonia Adult mechanical ventilator protocol in place Wean PEEP and FiO2 as tolerated to maintain sats greater than 90%. Mental status and neurologic review postarrest limits extubation.  Acute renal failure, AKI Lactic acidosis Following metabolic panel electrolytes With hyperkalemia may need dialysis however with her poor prognosis and multiorgan failure I am not sure that she is an excellent candidate for CVVHD, known EF 10%. I will however speak with her nephrologist further recommendations.  Hyperkalemia Treated with bicarb and Lokelma overnight. 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 RASS -5 VAP protocol (if indicated): Per protocl DVT prophylaxis: SQH GI prophylaxis: pepcid Glucose control: SSI Mobility: BR Code Status: FULL Family Communication:   I called and had a palliative care discussion with the patient's son Dorene Sorrow.  Also there was a family friend who is a Engineer, civil (consulting) on the line able to help facilitate the discussion.  After discussion of her multiorgan failure they do agree that becoming a DNR with no chest compressions at this point would be appropriate.  I also discussed liberation from mechanical ventilator and comfort care measures.  They seem to be open to this discussion and understand the grave prognosis of her multiorgan failure.  We will reach back with them again this afternoon.  I will also speak with Eduard Roux palliative care.  Disposition: ICU  Labs   CBC: Recent Labs  Lab 11/15/2018 1318  10/25/18  0325 10/25/18 0507 10/25/18 0512 10/26/18 0445 10/26/18 0449  WBC 10.3  --   --  20.7*  --   --  18.3*  NEUTROABS 3.5  --   --   --   --   --   --   HGB 12.9   < > 13.3 12.8 12.2 11.9* 11.9*  HCT 41.3   < > 39.0 38.5 36.0 35.0* 36.6  MCV 118.3*  --   --  108.8*  --   --  113.7*  PLT 157  --   --  209  --   --  194   < > = values in this interval not displayed.    Basic Metabolic Panel: Recent Labs  Lab 11/12/2018 1719  10/25/18 0507  10/25/18 1623 10/25/18 1836 10/25/18 2042 10/25/18 2300 10/26/18 0445 10/26/18 0449  NA  --    < > 139   < > 138 134* 136 136 138 140  K  --    < > 3.4*   < > 5.5* 5.5* 4.8 6.4* 5.8* 5.9*  CL  --    < > 108   < > 110 110 107 106  --  109  CO2  --    < > 15*   < > 15* 16* 17*  14*  --  10*  GLUCOSE  --    < > 175*   < > 161* 134* 166* 171*  --  136*  BUN  --    < > 23   < > 27* 28* 27* 29*  --  31*  CREATININE  --    < > 2.07*   < > 2.59* 2.81* 3.04* 3.17*  --  3.52*  CALCIUM  --    < > 8.3*   < > 7.8* 7.5* 7.6* 7.7*  --  7.7*  MG 2.0  --  1.8  --   --   --   --   --   --  2.0  PHOS  --   --  3.0  --   --   --   --   --   --  7.2*   < > = values in this interval not displayed.   GFR: Estimated Creatinine Clearance: 14.9 mL/min (A) (by C-G formula based on SCr of 3.52 mg/dL (H)). Recent Labs  Lab 03/26/2019 1318 03/26/2019 2114 10/25/18 0507 10/26/18 0449  PROCALCITON 0.10 56.34 84.70 118.05  WBC 10.3  --  20.7* 18.3*  LATICACIDVEN >11.0* 7.3* 6.3*  --     Liver Function Tests: Recent Labs  Lab 03/26/2019 1318  AST 127*  ALT 56*  ALKPHOS 88  BILITOT 0.8  PROT 5.9*  ALBUMIN 2.7*   No results for input(s): LIPASE, AMYLASE in the last 168 hours. No results for input(s): AMMONIA in the last 168 hours.  ABG    Component Value Date/Time   PHART 7.164 (LL) 10/26/2018 0445   PCO2ART 24.5 (L) 10/26/2018 0445   PO2ART 88.0 10/26/2018 0445   HCO3 9.1 (L) 10/26/2018 0445   TCO2 10 (L) 10/26/2018 0445   ACIDBASEDEF 19.0 (H) 10/26/2018  0445   O2SAT 95.0 10/26/2018 0445     Coagulation Profile: Recent Labs  Lab 03/26/2019 1719 03/26/2019 2114 10/25/18 0507  INR 1.4* 1.3* 1.2    Cardiac Enzymes: Recent Labs  Lab 03/26/2019 1719 03/26/2019 1959 10/25/18 0208 10/25/18 0824  TROPONINI 18.31* 30.19* 28.47* 22.82*    HbA1C: Hemoglobin A1C  Date/Time Value Ref Range Status  02/13/2017 03:18 PM 5.0  Final  08/11/2013 10:38 AM 5.5  Final   Hgb A1c MFr Bld  Date/Time Value Ref Range Status  11/05/202020 09:14 PM 4.9 4.8 - 5.6 % Final    Comment:    (NOTE) Pre diabetes:          5.7%-6.4% Diabetes:              >6.4% Glycemic control for   <7.0% adults with diabetes     CBG: Recent Labs  Lab 10/25/18 1620 10/25/18 2052 10/25/18 2308 10/26/18 0443 10/26/18 0821  GLUCAP 154* 161* 166* 108* 95    Review of Systems:   Unable: intubated  Past Medical History  She,  has a past medical history of Acute MI ([facility]), Cardiomyopathy, severe by echo with EF 10-20% (09/11/2011), and MR (mitral regurgitation), moderate (09/11/2011).   Surgical History   No past surgical history on file.   Social History   reports that she has been smoking cigarettes. She has a 3.75 pack-year smoking history. She has never used smokeless tobacco. She reports current alcohol use of about 9.0 standard drinks of alcohol per week. She reports current drug use. Frequency: 1.00 time per week. Drug: Cocaine.   Family History   Her family history is not on file.  Allergies No Known Allergies   Home Medications  Prior to Admission medications   Medication Sig Start Date End Date Taking? Authorizing Provider  amLODipine (NORVASC) 5 MG tablet Take 1 tablet (5 mg total) by mouth daily. 05/22/18   Loletta SpecterGomez, Roger David, PA-C  aspirin 81 MG tablet Take 1 tablet (81 mg total) by mouth daily. 02/13/17   Loletta SpecterGomez, Roger David, PA-C  atorvastatin (LIPITOR) 40 MG tablet Take 2 tablets (80 mg total) by mouth daily. 05/28/18   Loletta SpecterGomez, Roger David, PA-C   fluticasone [facility](FLONASE) 50 MCG/ACT nasal spray Place 2 sprays into both nostrils daily. 07/01/18   Cathie HoopsYu, Amy V, PA-C  hydrochlorothiazide (HYDRODIURIL) 25 MG tablet Take 1 tablet (25 mg total) by mouth daily. 05/22/18   Loletta SpecterGomez, Roger David, PA-C  ipratropium (ATROVENT) 0.06 % nasal spray Place 2 sprays into both nostrils 4 (four) times daily. 07/01/18   Cathie HoopsYu, Amy V, PA-C  metoprolol succinate (TOPROL XL) 25 MG 24 hr tablet Take 1 tablet (25 mg total) by mouth daily. 05/22/18   Loletta SpecterGomez, Roger David, PA-C  ondansetron (ZOFRAN ODT) 4 MG disintegrating tablet Take 1 tablet (4 mg total) by mouth every 8 (eight) hours as needed for nausea or vomiting. 07/01/18   Cathie HoopsYu, Amy V, PA-C  PAZEO 0.7 % SOLN Place 0.7 Bottles into both eyes every morning. 10/12/17   [provider]  varenicline (CHANTIX STARTING MONTH PAK) 0.5 MG X 11 & 1 MG X 42 tablet Take one 0.5 mg tablet by mouth once daily for 3 days, then increase to one 0.5 mg tablet twice daily for 4 days, then increase to one 1 mg tablet twice daily. 05/22/18   Loletta SpecterGomez, Roger David, PA-C     This patient is critically ill with multiple organ system failure; which, requires frequent high complexity decision making, assessment, support, evaluation, and titration of therapies. This was completed through the application of advanced monitoring technologies and extensive interpretation of multiple databases. During this encounter critical care time was devoted to patient care services described in this note for 45 minutes.   Josephine IgoBradley L Kristion Holifield, DO [facility] 10/26/2018 9:39 AM  Personal pager: [phone] If unanswered, please page CCM On-call: #[phone]

## 2018-10-26 NOTE — Progress Notes (Signed)
Progress Note  Patient Name: Dawn Cruz Date of Encounter: 10/26/2018  Primary Cardiologist: Hochrein   Subjective   68 year old female who was admitted with cardiac arrest.  She has a past medical history of VF arrest in 2013.  She has a cardiomyopathy with an ejection fraction of 10 to 20%.  She has a long history of hypertension, hyperlipidemia, mitral regurgitation, tobacco and cocaine abuse.  She had a witnessed collapse early in the afternoon of October 24, 2018.  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  Coworkers started CPR.  EMS arrived approximately 5 minutes later.  She received multiple rounds of epinephrine and amiodarone.  She had multiple defibrillations.  They were eventually able to achieve ROSC.  She was brought to [facility] and was started on hypothermia protocol.  She was last seen by cardiology in 2013 when she had a another VF arrest.  During that time her echocardiogram revealed severely reduced left ventricular systolic function with an ejection fraction of 10 to 20%.  Nuclear stress test revealed no evidence of reversible ischemia.  She was lost to follow-up from a cardiology standpoint.  UDS screen was negative for cocaine this admission. Negative for COVID 19  She has been hypotensive.   Is on Levo, epi drip, now on vasopressin  Very little urine output for since yesterday . Pulse waveform is very narrow   Has developed acute renal insufficiency.  Cr is 3.52 today    Inpatient Medications    Scheduled Meds:  artificial tears  1 application Both Eyes N5A   chlorhexidine gluconate (MEDLINE KIT)  15 mL Mouth Rinse BID   Chlorhexidine Gluconate Cloth  6 each Topical Daily   famotidine  20 mg Per Tube QHS   heparin  5,000 Units Subcutaneous Q8H   insulin aspart  0-15 Units Subcutaneous Q4H   mouth rinse  15 mL Mouth Rinse 10 times per day   sodium chloride flush  10-40 mL Intracatheter Q12H   Continuous Infusions:  sodium chloride  Stopped (10/26/18 0603)   amiodarone 30 mg/hr (10/26/18 0800)   ampicillin-sulbactam (UNASYN) IV Stopped (10/26/18 0554)   fentaNYL infusion INTRAVENOUS 100 mcg/hr (10/26/18 0800)   midazolam Stopped (10/25/18 1830)   norepinephrine (LEVOPHED) Adult infusion 20 mcg/min (10/26/18 0800)    sodium bicarbonate (isotonic) infusion in sterile water 75 mL/hr at 10/26/18 0800   vasopressin (PITRESSIN) infusion - *FOR SHOCK* 0.04 Units/min (10/26/18 0800)   PRN Meds: sodium chloride, fentaNYL, midazolam, sodium chloride flush   Vital Signs    Vitals:   10/26/18 0800 10/26/18 0815 10/26/18 0830 10/26/18 0845  BP: 108/77  106/70   Pulse:      Resp: (!) 24 (!) 27 (!) 21 (!) 0  Temp:   99 F (37.2 C)   TempSrc:   Bladder   SpO2:      Weight:      Height:        Intake/Output Summary (Last 24 hours) at 10/26/2018 0903 Last data filed at 10/26/2018 0800 Gross per 24 hour  Intake 3244.28 ml  Output 160 ml  Net 3084.28 ml   Last 3 Weights 11/16/2018 05/22/2018 10/29/2017  Weight (lbs) 150 lb 157 lb 3.2 oz 154 lb  Weight (kg) 68.04 kg 71.305 kg 69.854 kg      Telemetry    NSR   - Personally Reviewed  ECG     NSR ,  TWI in ant leads.    - Personally Reviewed  Physical  Exam   GEN: middle age female,  Intubated, unresponsive ( off sedation )  Neck: difficult to assess.  Cardiac: RR , no murmur   Respiratory: on ven t GI: Soft, nontender, non-distended  GN:OIBBCWU edema in hands ,  Legs,  Cool  Neuro:  unsble to assess . Psych: unable to assess   Labs    Chemistry Recent Labs  Lab 11/06/2018 1318  10/25/18 2042 10/25/18 2300 10/26/18 0445 10/26/18 0449  NA 135   < > 136 136 138 140  K 3.0*   < > 4.8 6.4* 5.8* 5.9*  CL 97*   < > 107 106  --  109  CO2 12*   < > 17* 14*  --  10*  GLUCOSE 523*   < > 166* 171*  --  136*  BUN 12   < > 27* 29*  --  31*  CREATININE 1.47*   < > 3.04* 3.17*  --  3.52*  CALCIUM 11.0*   < > 7.6* 7.7*  --  7.7*  PROT 5.9*  --   --   --   --   --    ALBUMIN 2.7*  --   --   --   --   --   AST 127*  --   --   --   --   --   ALT 56*  --   --   --   --   --   ALKPHOS 88  --   --   --   --   --   BILITOT 0.8  --   --   --   --   --   GFRNONAA 37*   < > 15* 15*  --  13*  GFRAA 43*   < > 18* 17*  --  15*  ANIONGAP 26*   < > 12 16*  --  21*   < > = values in this interval not displayed.     Hematology Recent Labs  Lab 10/23/2018 1318  10/25/18 0507 10/25/18 0512 10/26/18 0445 10/26/18 0449  WBC 10.3  --  20.7*  --   --  18.3*  RBC 3.49*  --  3.54*  --   --  3.22*  HGB 12.9   < > 12.8 12.2 11.9* 11.9*  HCT 41.3   < > 38.5 36.0 35.0* 36.6  MCV 118.3*  --  108.8*  --   --  113.7*  MCH 37.0*  --  36.2*  --   --  37.0*  MCHC 31.2  --  33.2  --   --  32.5  RDW 12.5  --  12.3  --   --  12.9  PLT 157  --  209  --   --  194   < > = values in this interval not displayed.    Cardiac Enzymes Recent Labs  Lab 11/16/2018 1719 10/23/2018 1959 10/25/18 0208 10/25/18 0824  TROPONINI 18.31* 30.19* 28.47* 22.82*   No results for input(s): TROPIPOC in the last 168 hours.   BNPNo results for input(s): BNP, PROBNP in the last 168 hours.   DDimer  Recent Labs  Lab 10/23/2018 1318  DDIMER >20.00*     Radiology    Ct Code Stroke Cta Head W/wo Contrast  Result Date: 11/18/2018 CLINICAL DATA:  Cardiac arrest. EXAM: CT ANGIOGRAPHY HEAD AND NECK CT PERFUSION BRAIN TECHNIQUE: Multidetector CT imaging of the head and neck was performed using the standard protocol during bolus administration  of intravenous contrast. Multiplanar CT image reconstructions and MIPs were obtained to evaluate the vascular anatomy. Carotid stenosis measurements (when applicable) are obtained utilizing NASCET criteria, using the distal internal carotid diameter as the denominator. Multiphase CT imaging of the brain was performed following IV bolus contrast injection. Subsequent parametric perfusion maps were calculated using RAPID software. CONTRAST:  188m OMNIPAQUE IOHEXOL 350  MG/ML SOLN COMPARISON:  CT head earlier today. FINDINGS: CTA NECK FINDINGS Aortic arch: Bovine trunk. No proximal stenosis. Right carotid system: No evidence of dissection, stenosis (50% or greater) or occlusion. Mild plaque at the RIGHT ICA origin. Left carotid system: No evidence of dissection, stenosis (50% or greater) or occlusion. Mild plaque at the LEFT ICA origin. Vertebral arteries: BILATERAL patent, LEFT dominant. No flow-limiting ostial stenosis. Skeleton: Spondylosis. Poor dentition. Other neck: No neck masses. Upper chest: Extensive consolidation in the dependent aspect of the RIGHT lung, query aspiration. Review of the MIP images confirms the above findings CTA HEAD FINDINGS Anterior circulation: Mild diffuse narrowing of the upper cervical and petrous ICA segments bilaterally, greater on the RIGHT. 50% cavernous carotid stenoses. Slight increased caliber of the LEFT MCA compared to the RIGHT, with asymmetric prominence of the LEFT M2 and M3 vessels compared to the RIGHT, associated with slight LEFT hemisphere parenchymal blush, favored to represent postictal phenomenon. No M2 or M3 vessel occlusion. Posterior circulation: No significant stenosis, proximal occlusion, aneurysm, or vascular malformation. Venous sinuses: As permitted by contrast timing, patent. Anatomic variants: None of significance. Delayed phase: Not performed Review of the MIP images confirms the above findings CT Brain Perfusion Findings: ASPECTS: 10 CBF (<30%) Volume: 052mPerfusion (Tmax>6.0s) volume: 29m45mismatch Volume: 29mL53mfarction Location:Not applicable. Diffusely, and profoundly increased regional cerebral blood flow, regional cerebral blood volume, with decreased T-max, over much of the LEFT hemisphere, most consistent with recent or ongoing epileptiform activity. IMPRESSION: 1. No emergent large vessel occlusion. 2. Diffusely increased regional cerebral blood flow and blood volume over much of the LEFT hemisphere, decreased  T-max, consistent with interictal or postictal phenomenon. 3. No extracranial or intracranial flow reducing stenosis or dissection. 4. Extensive consolidation in the dependent portion of the RIGHT lung, query aspiration. 5. These results were called by telephone at the time of interpretation on 11/18/2018 at 7:06 pm to Dr. ArorRory Percyo verbally acknowledged these results. Electronically Signed   By: JohnStaci Righter.   On: 10/23/2018 19:19   Ct Head Wo Contrast  Result Date: 11/03/2018 CLINICAL DATA:  Collapse, cardiac arrest EXAM: CT HEAD WITHOUT CONTRAST TECHNIQUE: Contiguous axial images were obtained from the base of the skull through the vertex without intravenous contrast. COMPARISON:  09/11/2011 FINDINGS: Brain: There is subtle hypodensity and loss of gray-white distinction of the left MCA territory (series 2, image 21, series 4, image 32). Vascular: No hyperdense vessel or unexpected calcification. Skull: Normal. Negative for fracture or focal lesion. Sinuses/Orbits: No acute finding. Other: None. IMPRESSION: There is subtle hypodensity and loss of gray-white distinction of the left MCA territory (series 2, image 21, series 4, image 32), concerning for large left MCA territory infarction. Consider MRI to more sensitively evaluate for acute diffusion restricting infarction. No intracranial hemorrhage. Electronically Signed   By: AlexEddie Candle.   On: 10/22/2018 16:54   Ct Angio Neck W Or Wo Contrast  Result Date: 11/06/2018 CLINICAL DATA:  Cardiac arrest. EXAM: CT ANGIOGRAPHY HEAD AND NECK CT PERFUSION BRAIN TECHNIQUE: Multidetector CT imaging of the head and neck was performed using the standard protocol  during bolus administration of intravenous contrast. Multiplanar CT image reconstructions and MIPs were obtained to evaluate the vascular anatomy. Carotid stenosis measurements (when applicable) are obtained utilizing NASCET criteria, using the distal internal carotid diameter as the denominator.  Multiphase CT imaging of the brain was performed following IV bolus contrast injection. Subsequent parametric perfusion maps were calculated using RAPID software. CONTRAST:  137m OMNIPAQUE IOHEXOL 350 MG/ML SOLN COMPARISON:  CT head earlier today. FINDINGS: CTA NECK FINDINGS Aortic arch: Bovine trunk. No proximal stenosis. Right carotid system: No evidence of dissection, stenosis (50% or greater) or occlusion. Mild plaque at the RIGHT ICA origin. Left carotid system: No evidence of dissection, stenosis (50% or greater) or occlusion. Mild plaque at the LEFT ICA origin. Vertebral arteries: BILATERAL patent, LEFT dominant. No flow-limiting ostial stenosis. Skeleton: Spondylosis. Poor dentition. Other neck: No neck masses. Upper chest: Extensive consolidation in the dependent aspect of the RIGHT lung, query aspiration. Review of the MIP images confirms the above findings CTA HEAD FINDINGS Anterior circulation: Mild diffuse narrowing of the upper cervical and petrous ICA segments bilaterally, greater on the RIGHT. 50% cavernous carotid stenoses. Slight increased caliber of the LEFT MCA compared to the RIGHT, with asymmetric prominence of the LEFT M2 and M3 vessels compared to the RIGHT, associated with slight LEFT hemisphere parenchymal blush, favored to represent postictal phenomenon. No M2 or M3 vessel occlusion. Posterior circulation: No significant stenosis, proximal occlusion, aneurysm, or vascular malformation. Venous sinuses: As permitted by contrast timing, patent. Anatomic variants: None of significance. Delayed phase: Not performed Review of the MIP images confirms the above findings CT Brain Perfusion Findings: ASPECTS: 10 CBF (<30%) Volume: 029mPerfusion (Tmax>6.0s) volume: 27m727mismatch Volume: 27mL58mfarction Location:Not applicable. Diffusely, and profoundly increased regional cerebral blood flow, regional cerebral blood volume, with decreased T-max, over much of the LEFT hemisphere, most consistent with  recent or ongoing epileptiform activity. IMPRESSION: 1. No emergent large vessel occlusion. 2. Diffusely increased regional cerebral blood flow and blood volume over much of the LEFT hemisphere, decreased T-max, consistent with interictal or postictal phenomenon. 3. No extracranial or intracranial flow reducing stenosis or dissection. 4. Extensive consolidation in the dependent portion of the RIGHT lung, query aspiration. 5. These results were called by telephone at the time of interpretation on 10/28/2018 at 7:06 pm to Dr. ArorRory Percyo verbally acknowledged these results. Electronically Signed   By: JohnStaci Righter.   On: 11/06/2018 19:19   Ct Cerebral Perfusion W Contrast  Result Date: 11/14/2018 CLINICAL DATA:  Cardiac arrest. EXAM: CT ANGIOGRAPHY HEAD AND NECK CT PERFUSION BRAIN TECHNIQUE: Multidetector CT imaging of the head and neck was performed using the standard protocol during bolus administration of intravenous contrast. Multiplanar CT image reconstructions and MIPs were obtained to evaluate the vascular anatomy. Carotid stenosis measurements (when applicable) are obtained utilizing NASCET criteria, using the distal internal carotid diameter as the denominator. Multiphase CT imaging of the brain was performed following IV bolus contrast injection. Subsequent parametric perfusion maps were calculated using RAPID software. CONTRAST:  1027mL83mIPAQUE IOHEXOL 350 MG/ML SOLN COMPARISON:  CT head earlier today. FINDINGS: CTA NECK FINDINGS Aortic arch: Bovine trunk. No proximal stenosis. Right carotid system: No evidence of dissection, stenosis (50% or greater) or occlusion. Mild plaque at the RIGHT ICA origin. Left carotid system: No evidence of dissection, stenosis (50% or greater) or occlusion. Mild plaque at the LEFT ICA origin. Vertebral arteries: BILATERAL patent, LEFT dominant. No flow-limiting ostial stenosis. Skeleton: Spondylosis. Poor dentition. Other neck: No neck masses.  Upper chest: Extensive  consolidation in the dependent aspect of the RIGHT lung, query aspiration. Review of the MIP images confirms the above findings CTA HEAD FINDINGS Anterior circulation: Mild diffuse narrowing of the upper cervical and petrous ICA segments bilaterally, greater on the RIGHT. 50% cavernous carotid stenoses. Slight increased caliber of the LEFT MCA compared to the RIGHT, with asymmetric prominence of the LEFT M2 and M3 vessels compared to the RIGHT, associated with slight LEFT hemisphere parenchymal blush, favored to represent postictal phenomenon. No M2 or M3 vessel occlusion. Posterior circulation: No significant stenosis, proximal occlusion, aneurysm, or vascular malformation. Venous sinuses: As permitted by contrast timing, patent. Anatomic variants: None of significance. Delayed phase: Not performed Review of the MIP images confirms the above findings CT Brain Perfusion Findings: ASPECTS: 10 CBF (<30%) Volume: 35m Perfusion (Tmax>6.0s) volume: 027mMismatch Volume: 78m65mnfarction Location:Not applicable. Diffusely, and profoundly increased regional cerebral blood flow, regional cerebral blood volume, with decreased T-max, over much of the LEFT hemisphere, most consistent with recent or ongoing epileptiform activity. IMPRESSION: 1. No emergent large vessel occlusion. 2. Diffusely increased regional cerebral blood flow and blood volume over much of the LEFT hemisphere, decreased T-max, consistent with interictal or postictal phenomenon. 3. No extracranial or intracranial flow reducing stenosis or dissection. 4. Extensive consolidation in the dependent portion of the RIGHT lung, query aspiration. 5. These results were called by telephone at the time of interpretation on 11/15/2018 at 7:06 pm to Dr. AroRory Percyho verbally acknowledged these results. Electronically Signed   By: JohStaci RighterD.   On: 10/23/2018 19:19   Dg Chest Portable 1 View  Result Date: 11/09/2018 CLINICAL DATA:  Intubation, CPR EXAM: PORTABLE CHEST 1  VIEW COMPARISON:  Portable exam 1300 hours compared to 09/17/2011 FINDINGS: Tip of endotracheal tube projects 1.9 cm above carina. Nasogastric tube extends into stomach. Upper normal heart size. RIGHT upper lobe infiltrate which could represent pneumonia or aspiration. Remaining lungs grossly clear. No pleural effusion or pneumothorax. IMPRESSION: RIGHT upper lobe infiltrate question pneumonia versus aspiration. Electronically Signed   By: MarLavonia DanaD.   On: 11/08/2018 13:12    Cardiac Studies     Patient Profile     67 17o. female with hx of and is nonischemic cardiomyopathy with an ejection fraction of 10 to 20%.  She has a history of ventricular fibrillation in the past and was admitted yesterday following a VF arrest.  She is now in the ICU.  She is intubated and sedated.  She has been on the [employer].  Assessment & Plan    1.  Status post VF arrest: She is not had any further arrhythmias.  Continue amio for now. EF is 10-15%.   She has acute renal failure. Agree with plans to see if nephrology can offer some assistance.    2.  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 Continue supportive care   3.  Cardiogenic shock:   Continue presser support. She has not make much of a neurologic recovery    For questions or updates, please contact [facility] consult [email or web address] for contact info under        Signed, PhiMertie MooresD  10/26/2018, 9:03 AM

## 2018-10-26 NOTE — Progress Notes (Signed)
eLink Physician-Brief Progress Note Patient Name: CONSUELA WIDENER DOB: [birth year] MRN: [id number]   Date of Service  10/26/2018  HPI/Events of Note  Metabolic acidosis with pH 7.16/24/88/10  eICU Interventions  4 amps of bicarb IVP and start bicarb gtt     Intervention Category Major Interventions: Acid-Base disturbance - evaluation and management  Kari Kerth 10/26/2018, 5:23 AM

## 2018-10-26 NOTE — Progress Notes (Signed)
Called pt's son, Reniah Cottingham, to check when he was planning to come to the hospital. He said that he and his stepbrother will be here first thing in the morning to see their mother. I reinforced with him that since she is a DNR we would not resuscitate her in the event of cardiac arrest, but that we will continue to maintain her plan of care tonight. He seemed understanding of this. Will continue to carry out the patient's care plan per the orders in place.  Nelva Bush RN

## 2018-10-26 NOTE — Progress Notes (Signed)
Assessed patient, however patient intubated and on sedation. The patient is in cardiogenic shock and in acute renal failure, not a candidate for dialysis. Spoke to [facility] attending, does not think patient will survive and waiting on family for withdrawal of care.   Neurology will be available as needed.

## 2018-10-26 NOTE — Consult Note (Signed)
Responded to page from nurse. Family on way, unknown ETA. Pt may pass soon. Nurse said pt is [facility], requested prayer for her now. Visited and prayer for her. Nurse will page again if family desires additional chaplain services at time of extubation.  Rev. Eloise Levels Chaplain

## 2018-10-27 DIAGNOSIS — Z515 Encounter for palliative care: Secondary | ICD-10-CM

## 2018-10-27 LAB — BASIC METABOLIC PANEL
Anion gap: 17 — ABNORMAL HIGH (ref 5–15)
BUN: 46 mg/dL — ABNORMAL HIGH (ref 8–23)
CO2: 19 mmol/L — ABNORMAL LOW (ref 22–32)
Calcium: 7.3 mg/dL — ABNORMAL LOW (ref 8.9–10.3)
Chloride: 106 mmol/L (ref 98–111)
Creatinine, Ser: 5.14 mg/dL — ABNORMAL HIGH (ref 0.44–1.00)
GFR calc Af Amer: 9 mL/min — ABNORMAL LOW (ref >60)
GFR calc non Af Amer: 8 mL/min — ABNORMAL LOW (ref >60)
Glucose, Bld: 104 mg/dL — ABNORMAL HIGH (ref 70–99)
Potassium: 5.3 mmol/L — ABNORMAL HIGH (ref 3.5–5.1)
Sodium: 142 mmol/L (ref 135–145)

## 2018-10-27 LAB — GLUCOSE, CAPILLARY
Glucose-Capillary: 105 mg/dL — ABNORMAL HIGH (ref 70–99)
Glucose-Capillary: 108 mg/dL — ABNORMAL HIGH (ref 70–99)
Glucose-Capillary: 111 mg/dL — ABNORMAL HIGH (ref 70–99)
Glucose-Capillary: 87 mg/dL (ref 70–99)

## 2018-10-27 LAB — CULTURE, RESPIRATORY W GRAM STAIN: Culture: NORMAL

## 2018-10-27 MED ORDER — GLYCOPYRROLATE 0.2 MG/ML IJ SOLN: 0.2000 mg | INTRAMUSCULAR | Status: DC | PRN

## 2018-10-27 MED ORDER — FENTANYL BOLUS VIA INFUSION
100.0000 ug | INTRAVENOUS | Status: DC | PRN
  Filled 2018-10-27: qty 100

## 2018-10-27 MED ORDER — MORPHINE SULFATE (PF) 4 MG/ML IV SOLN
4.0000 mg | INTRAVENOUS | Status: DC | PRN
  Administered 2018-10-27: 4 mg via INTRAVENOUS
  Filled 2018-10-27: qty 1

## 2018-10-27 MED ORDER — FENTANYL 2500MCG IN NS 250ML (10MCG/ML) PREMIX INFUSION: 0.0000 ug/h | INTRAVENOUS | Status: DC

## 2018-10-27 MED ORDER — POLYVINYL ALCOHOL 1.4 % OP SOLN
1.0000 [drp] | Freq: Four times a day (QID) | OPHTHALMIC | Status: DC | PRN
  Filled 2018-10-27: qty 15

## 2018-10-27 MED ORDER — ACETAMINOPHEN 650 MG RE SUPP: 650.0000 mg | Freq: Four times a day (QID) | RECTAL | Status: DC | PRN

## 2018-10-27 MED ORDER — ACETAMINOPHEN 325 MG PO TABS: 650.0000 mg | ORAL_TABLET | Freq: Four times a day (QID) | ORAL | Status: DC | PRN

## 2018-10-27 MED ORDER — GLYCOPYRROLATE 1 MG PO TABS
1.0000 mg | ORAL_TABLET | ORAL | Status: DC | PRN
  Filled 2018-10-27: qty 1

## 2018-10-27 MED ORDER — FENTANYL CITRATE (PF) 100 MCG/2ML IJ SOLN: 50.0000 ug | INTRAMUSCULAR | Status: DC | PRN

## 2018-10-27 MED ORDER — DIPHENHYDRAMINE HCL 50 MG/ML IJ SOLN: 25.0000 mg | INTRAMUSCULAR | Status: DC | PRN

## 2018-10-29 DIAGNOSIS — I5023 Acute on chronic systolic (congestive) heart failure: Secondary | ICD-10-CM

## 2018-10-29 DIAGNOSIS — E872 Acidosis, unspecified: Secondary | ICD-10-CM

## 2018-10-29 DIAGNOSIS — N179 Acute kidney failure, unspecified: Secondary | ICD-10-CM

## 2018-10-29 LAB — CULTURE, BLOOD (ROUTINE X 2)
Culture: NO GROWTH
Culture: NO GROWTH
Special Requests: ADEQUATE

## 2018-10-30 ENCOUNTER — Telehealth: Payer: Self-pay

## 2018-10-30 NOTE — Telephone Encounter (Signed)
Received dc from [facility].   The dc is for cremation and a patient of Doctor Elsworth Soho.   Dc will be taken to [facility] [address] for signature.  On 10/31/2018 received sign dc back from Doctor Ruthann Cancer who signed the [place] for Doctor Elsworth Soho.  I called the funeral home to let them know the dc is ready for pickup.

## 2018-11-19 NOTE — Progress Notes (Signed)
Fentanyl and versed drips. Patient sonerous, family at bedside. They feel she is comfortable.  All vasoactive medications off.

## 2018-11-19 NOTE — Death Summary Note (Signed)
DEATH SUMMARY   Patient Details  Name: Dawn Cruz MRN: [id number] DOB: Jul 24, 1950  Admission/Discharge Information   Admit Date:  11-03-2018  Date of Death: Date of Death: 11-06-2018  Time of Death: Time of Death: 1100  Length of Stay: 3  Referring Physician: Loletta Specter, PA-C   Reason(s) for Hospitalization  68 year old female witnessed VF arrest with quick bystander CPR. 30-45 minute duration off and on.   Diagnoses  Preliminary cause of death:  Secondary Diagnoses (including complications and co-morbidities):  Active Problems:   Acute respiratory failure with hypoxia, self extubated 4/28   Cardiomyopathy (HCC)   MR (mitral regurgitation), moderate   Cardiac arrest with ventricular fibrillation (HCC)   Chronic combined systolic and diastolic CHF (congestive heart failure) (HCC)   Cardiac arrest (HCC)   Palliative care by specialist   Acute renal failure (ARF) (HCC)   Acute on chronic systolic heart failure (HCC)   Lactic acidosis   Brief Hospital Course (including significant findings, care, treatment, and services provided and events leading to death)  Dawn Cruz is a 68 y.o. year old female who prior history of prior cardiac arrest, MI, and HFrEF 10% in 2013. No other history available in  EMR. She is supposed to be on HCTZ, Norvasc, and toprol, however she last filled these 4 months ago for 90 days worth. Her PCP felt as though she had not been compliant. She smokes 3 cigarettes per day and drinks two fifths of liquor per week. She was at work in her usual state of health when she collapsed in the early PM hours of 6/5. Collapse was witnessed and EMS was called right away. Reports of bystander CPR. EMS responded in approximately 5 mins. Initial rhythm VF. Patient underwent ACLS with defibrillation for entire trip to ED. Continued to be in VF for first 30 mins in ED with brief periods of ROSC. She was intubated, CVL placed, and treated with amiodarone and  norepinephrine infusions in addition to ACLS with multiple defibrillations. Once ROSC achieved with relative stability, PCCM has been asked to admit.  Patient completed targeted temperature management.  She had progressive multiorgan failure and refractory shock.  Decision was made that she would not want to pursue dialysis.  Also she had not had a favorable recovery neurologically post cardiac arrest.  Overall prognosis was very poor with multiorgan failure and progressive refractory shock.  Palliative care discussions with the patient's family were had.  Decision was made to liberate patient from mechanical ventilator and allowing natural death.  Patient was transition to comfort care measures.   Pertinent Labs and Studies  Significant Diagnostic Studies Ct Code Stroke Cta Head W/wo Contrast  Result Date: 11-03-2018 CLINICAL DATA:  Cardiac arrest. EXAM: CT ANGIOGRAPHY HEAD AND NECK CT PERFUSION BRAIN TECHNIQUE: Multidetector CT imaging of the head and neck was performed using the standard protocol during bolus administration of intravenous contrast. Multiplanar CT image reconstructions and MIPs were obtained to evaluate the vascular anatomy. Carotid stenosis measurements (when applicable) are obtained utilizing NASCET criteria, using the distal internal carotid diameter as the denominator. Multiphase CT imaging of the brain was performed following IV bolus contrast injection. Subsequent parametric perfusion maps were calculated using RAPID software. CONTRAST:  OMNIPAQUE IOHEXOL 350 MG/ML SOLN COMPARISON:  CT head earlier today. FINDINGS: CTA NECK FINDINGS Aortic arch: Bovine trunk. No proximal stenosis. Right carotid system: No evidence of dissection, stenosis (50% or greater) or occlusion. Mild plaque at the RIGHT ICA origin. Left  carotid system: No evidence of dissection, stenosis (50% or greater) or occlusion. Mild plaque at the LEFT ICA origin. Vertebral arteries: BILATERAL patent, LEFT dominant.  No flow-limiting ostial stenosis. Skeleton: Spondylosis. Poor dentition. Other neck: No neck masses. Upper chest: Extensive consolidation in the dependent aspect of the RIGHT lung, query aspiration. Review of the MIP images confirms the above findings CTA HEAD FINDINGS Anterior circulation: Mild diffuse narrowing of the upper cervical and petrous ICA segments bilaterally, greater on the RIGHT. 50% cavernous carotid stenoses. Slight increased caliber of the LEFT MCA compared to the RIGHT, with asymmetric prominence of the LEFT M2 and M3 vessels compared to the RIGHT, associated with slight LEFT hemisphere parenchymal blush, favored to represent postictal phenomenon. No M2 or M3 vessel occlusion. Posterior circulation: No significant stenosis, proximal occlusion, aneurysm, or vascular malformation. Venous sinuses: As permitted by contrast timing, patent. Anatomic variants: None of significance. Delayed phase: Not performed Review of the MIP images confirms the above findings CT Brain Perfusion Findings: ASPECTS: 10 CBF (<30%) Volume: 75mL Perfusion (Tmax>6.0s) volume: 67mL Mismatch Volume: 26mL Infarction Location:Not applicable. Diffusely, and profoundly increased regional cerebral blood flow, regional cerebral blood volume, with decreased T-max, over much of the LEFT hemisphere, most consistent with recent or ongoing epileptiform activity. IMPRESSION: 1. No emergent large vessel occlusion. 2. Diffusely increased regional cerebral blood flow and blood volume over much of the LEFT hemisphere, decreased T-max, consistent with interictal or postictal phenomenon. 3. No extracranial or intracranial flow reducing stenosis or dissection. 4. Extensive consolidation in the dependent portion of the RIGHT lung, query aspiration. 5. These results were called by telephone at the time of interpretation on October 28, 2018 at 7:06 pm to Dr. Rory Percy, who verbally acknowledged these results. Electronically Signed   By: Staci Righter M.D.   On:  10-28-2018 19:19   Dg Abd 1 View  Result Date: 10/26/2018 CLINICAL DATA:  Interval placement enteric tube EXAM: ABDOMEN - 1 VIEW COMPARISON:  None. FINDINGS: Enteric tube tip and side-port project over the stomach. Paucity of bowel gas. Lumbar spine degenerative changes. Vascular calcifications. IMPRESSION: Enteric tube projects over the stomach. Electronically Signed   By: Lovey Newcomer M.D.   On: 10/26/2018 10:23   Ct Head Wo Contrast  Result Date: Oct 28, 2018 CLINICAL DATA:  Collapse, cardiac arrest EXAM: CT HEAD WITHOUT CONTRAST TECHNIQUE: Contiguous axial images were obtained from the base of the skull through the vertex without intravenous contrast. COMPARISON:  09/11/2011 FINDINGS: Brain: There is subtle hypodensity and loss of gray-white distinction of the left MCA territory (series 2, image 21, series 4, image 32). Vascular: No hyperdense vessel or unexpected calcification. Skull: Normal. Negative for fracture or focal lesion. Sinuses/Orbits: No acute finding. Other: None. IMPRESSION: There is subtle hypodensity and loss of gray-white distinction of the left MCA territory (series 2, image 21, series 4, image 32), concerning for large left MCA territory infarction. Consider MRI to more sensitively evaluate for acute diffusion restricting infarction. No intracranial hemorrhage. Electronically Signed   By: Eddie Candle M.D.   On: 10/28/18 16:54   Ct Angio Neck W Or Wo Contrast  Result Date: 10-28-18 CLINICAL DATA:  Cardiac arrest. EXAM: CT ANGIOGRAPHY HEAD AND NECK CT PERFUSION BRAIN TECHNIQUE: Multidetector CT imaging of the head and neck was performed using the standard protocol during bolus administration of intravenous contrast. Multiplanar CT image reconstructions and MIPs were obtained to evaluate the vascular anatomy. Carotid stenosis measurements (when applicable) are obtained utilizing NASCET criteria, using the distal internal carotid diameter as the  denominator. Multiphase CT imaging of  the brain was performed following IV bolus contrast injection. Subsequent parametric perfusion maps were calculated using RAPID software. CONTRAST:  100mL OMNIPAQUE IOHEXOL 350 MG/ML SOLN COMPARISON:  CT head earlier today. FINDINGS: CTA NECK FINDINGS Aortic arch: Bovine trunk. No proximal stenosis. Right carotid system: No evidence of dissection, stenosis (50% or greater) or occlusion. Mild plaque at the RIGHT ICA origin. Left carotid system: No evidence of dissection, stenosis (50% or greater) or occlusion. Mild plaque at the LEFT ICA origin. Vertebral arteries: BILATERAL patent, LEFT dominant. No flow-limiting ostial stenosis. Skeleton: Spondylosis. Poor dentition. Other neck: No neck masses. Upper chest: Extensive consolidation in the dependent aspect of the RIGHT lung, query aspiration. Review of the MIP images confirms the above findings CTA HEAD FINDINGS Anterior circulation: Mild diffuse narrowing of the upper cervical and petrous ICA segments bilaterally, greater on the RIGHT. 50% cavernous carotid stenoses. Slight increased caliber of the LEFT MCA compared to the RIGHT, with asymmetric prominence of the LEFT M2 and M3 vessels compared to the RIGHT, associated with slight LEFT hemisphere parenchymal blush, favored to represent postictal phenomenon. No M2 or M3 vessel occlusion. Posterior circulation: No significant stenosis, proximal occlusion, aneurysm, or vascular malformation. Venous sinuses: As permitted by contrast timing, patent. Anatomic variants: None of significance. Delayed phase: Not performed Review of the MIP images confirms the above findings CT Brain Perfusion Findings: ASPECTS: 10 CBF (<30%) Volume: 0mL Perfusion (Tmax>6.0s) volume: 0mL Mismatch Volume: 0mL Infarction Location:Not applicable. Diffusely, and profoundly increased regional cerebral blood flow, regional cerebral blood volume, with decreased T-max, over much of the LEFT hemisphere, most consistent with recent or ongoing  epileptiform activity. IMPRESSION: 1. No emergent large vessel occlusion. 2. Diffusely increased regional cerebral blood flow and blood volume over much of the LEFT hemisphere, decreased T-max, consistent with interictal or postictal phenomenon. 3. No extracranial or intracranial flow reducing stenosis or dissection. 4. Extensive consolidation in the dependent portion of the RIGHT lung, query aspiration. 5. These results were called by telephone at the time of interpretation on 10/23/2018 at 7:06 pm to Dr. Wilford CornerArora, who verbally acknowledged these results. Electronically Signed   By: Elsie StainJohn T Curnes M.D.   On: 07/21/2018 19:19   Ct Cerebral Perfusion W Contrast  Result Date: 10/25/2018 CLINICAL DATA:  Cardiac arrest. EXAM: CT ANGIOGRAPHY HEAD AND NECK CT PERFUSION BRAIN TECHNIQUE: Multidetector CT imaging of the head and neck was performed using the standard protocol during bolus administration of intravenous contrast. Multiplanar CT image reconstructions and MIPs were obtained to evaluate the vascular anatomy. Carotid stenosis measurements (when applicable) are obtained utilizing NASCET criteria, using the distal internal carotid diameter as the denominator. Multiphase CT imaging of the brain was performed following IV bolus contrast injection. Subsequent parametric perfusion maps were calculated using RAPID software. CONTRAST:  100mL OMNIPAQUE IOHEXOL 350 MG/ML SOLN COMPARISON:  CT head earlier today. FINDINGS: CTA NECK FINDINGS Aortic arch: Bovine trunk. No proximal stenosis. Right carotid system: No evidence of dissection, stenosis (50% or greater) or occlusion. Mild plaque at the RIGHT ICA origin. Left carotid system: No evidence of dissection, stenosis (50% or greater) or occlusion. Mild plaque at the LEFT ICA origin. Vertebral arteries: BILATERAL patent, LEFT dominant. No flow-limiting ostial stenosis. Skeleton: Spondylosis. Poor dentition. Other neck: No neck masses. Upper chest: Extensive consolidation in the  dependent aspect of the RIGHT lung, query aspiration. Review of the MIP images confirms the above findings CTA HEAD FINDINGS Anterior circulation: Mild diffuse narrowing of the upper cervical and  petrous ICA segments bilaterally, greater on the RIGHT. 50% cavernous carotid stenoses. Slight increased caliber of the LEFT MCA compared to the RIGHT, with asymmetric prominence of the LEFT M2 and M3 vessels compared to the RIGHT, associated with slight LEFT hemisphere parenchymal blush, favored to represent postictal phenomenon. No M2 or M3 vessel occlusion. Posterior circulation: No significant stenosis, proximal occlusion, aneurysm, or vascular malformation. Venous sinuses: As permitted by contrast timing, patent. Anatomic variants: None of significance. Delayed phase: Not performed Review of the MIP images confirms the above findings CT Brain Perfusion Findings: ASPECTS: 10 CBF (<30%) Volume: 0mL Perfusion (Tmax>6.0s) volume: 0mL Mismatch Volume: 0mL Infarction Location:Not applicable. Diffusely, and profoundly increased regional cerebral blood flow, regional cerebral blood volume, with decreased T-max, over much of the LEFT hemisphere, most consistent with recent or ongoing epileptiform activity. IMPRESSION: 1. No emergent large vessel occlusion. 2. Diffusely increased regional cerebral blood flow and blood volume over much of the LEFT hemisphere, decreased T-max, consistent with interictal or postictal phenomenon. 3. No extracranial or intracranial flow reducing stenosis or dissection. 4. Extensive consolidation in the dependent portion of the RIGHT lung, query aspiration. 5. These results were called by telephone at the time of interpretation on 10/20/2018 at 7:06 pm to Dr. Wilford Corner, who verbally acknowledged these results. Electronically Signed   By: Elsie Stain M.D.   On: 11/02/2018 19:19   Dg Chest Port 1 View  Result Date: 10/26/2018 CLINICAL DATA:  OG tube placement. EXAM: PORTABLE CHEST 1 VIEW COMPARISON:   Chest radiograph 10/31/2018 FINDINGS: ET tube mid trachea. Enteric tube projects over the stomach. Monitoring leads overlie the patient. Stable cardiomegaly. Low lung volumes. Interval increase in opacities throughout the right lung. Left basilar atelectasis. Possible small bilateral pleural effusions. IMPRESSION: Increasing opacities throughout the right lung may represent infection or asymmetric edema. Small bilateral pleural effusions. Electronically Signed   By: Annia Belt M.D.   On: 10/26/2018 10:23   Dg Chest Portable 1 View  Result Date: 11/05/2018 CLINICAL DATA:  Intubation, CPR EXAM: PORTABLE CHEST 1 VIEW COMPARISON:  Portable exam 1300 hours compared to 09/17/2011 FINDINGS: Tip of endotracheal tube projects 1.9 cm above carina. Nasogastric tube extends into stomach. Upper normal heart size. RIGHT upper lobe infiltrate which could represent pneumonia or aspiration. Remaining lungs grossly clear. No pleural effusion or pneumothorax. IMPRESSION: RIGHT upper lobe infiltrate question pneumonia versus aspiration. Electronically Signed   By: Ulyses Southward M.D.   On: 11/14/2018 13:12    Microbiology Recent Results (from the past 240 hour(s))  Blood Culture (routine x 2)     Status: None (Preliminary result)   Collection Time: 11/16/2018  1:25 PM  Result Value Ref Range Status   Specimen Description BLOOD SITE NOT SPECIFIED  Final   Special Requests   Final    BOTTLES DRAWN AEROBIC AND ANAEROBIC Blood Culture adequate volume   Culture   Final    NO GROWTH 4 DAYS Performed at [facility], [address] [place], [place] [address]    Report Status PENDING  Incomplete  Blood Culture (routine x 2)     Status: None (Preliminary result)   Collection Time: 11/01/2018  1:51 PM  Result Value Ref Range Status   Specimen Description BLOOD LEFT HAND  Final   Special Requests   Final    BOTTLES DRAWN AEROBIC ONLY Blood Culture results may not be optimal due to an inadequate volume of blood received in  culture bottles   Culture   Final  NO GROWTH 4 DAYS Performed at [facility], [address] [place], [place] [address]    Report Status PENDING  Incomplete  SARS Coronavirus 2 (CEPHEID - Performed in [facility] hospital lab), Hosp Order     Status: None   Collection Time: 11/11/2018  1:52 PM  Result Value Ref Range Status   SARS Coronavirus 2 NEGATIVE NEGATIVE Final    Comment: (NOTE) If result is NEGATIVE SARS-CoV-2 target nucleic acids are NOT DETECTED. The SARS-CoV-2 RNA is generally detectable in upper and lower  respiratory specimens during the acute phase of infection. The lowest  concentration of SARS-CoV-2 viral copies this assay can detect is 250  copies / mL. A negative result does not preclude SARS-CoV-2 infection  and should not be used as the sole basis for treatment or other  patient management decisions.  A negative result may occur with  improper specimen collection / handling, submission of specimen other  than nasopharyngeal swab, presence of viral mutation(s) within the  areas targeted by this assay, and inadequate number of viral copies  (<250 copies / mL). A negative result must be combined with clinical  observations, patient history, and epidemiological information. If result is POSITIVE SARS-CoV-2 target nucleic acids are DETECTED. The SARS-CoV-2 RNA is generally detectable in upper and lower  respiratory specimens dur ing the acute phase of infection.  Positive  results are indicative of active infection with SARS-CoV-2.  Clinical  correlation with patient history and other diagnostic information is  necessary to determine patient infection status.  Positive results do  not rule out bacterial infection or co-infection with other viruses. If result is PRESUMPTIVE POSTIVE SARS-CoV-2 nucleic acids MAY BE PRESENT.   A presumptive positive result was obtained on the submitted specimen  and confirmed on repeat testing.  While 2019 novel coronavirus   (SARS-CoV-2) nucleic acids may be present in the submitted sample  additional confirmatory testing may be necessary for epidemiological  and / or clinical management purposes  to differentiate between  SARS-CoV-2 and other Sarbecovirus currently known to infect humans.  If clinically indicated additional testing with an alternate test  methodology [phone]([id number]) is advised. The SARS-CoV-2 RNA is generally  detectable in upper and lower respiratory sp ecimens during the acute  phase of infection. The expected result is Negative. Fact Sheet for Patients:  BoilerBrush.com.cyhttps://www.fda.gov/media/136312/download Fact Sheet for Healthcare Providers: https://pope.com/https://www.fda.gov/media/136313/download This test is not yet approved or cleared by the [place] FDA and has been authorized for detection and/or diagnosis of SARS-CoV-2 by FDA under an Emergency Use Authorization (EUA).  This EUA will remain in effect (meaning this test can be used) for the duration of the COVID-19 declaration under Section 564(b)(1) of the Act, 21 U.S.C. section 360bbb-3(b)(1), unless the authorization is terminated or revoked sooner. Performed at [facility], [address] [place], [place] [address]   MRSA PCR Screening     Status: None   Collection Time: 10/26/2018  8:25 PM  Result Value Ref Range Status   MRSA by PCR NEGATIVE NEGATIVE Final    Comment:        The GeneXpert MRSA Assay (FDA approved for NASAL specimens only), is one component of a comprehensive MRSA colonization surveillance program. It is not intended to diagnose MRSA infection nor to guide or monitor treatment for MRSA infections. Performed at [facility], [address] [place], [place] [address]   Culture, respiratory (non-expectorated)     Status: None   Collection Time:  10/25/18  9:51 AM  Result Value Ref Range Status   Specimen Description TRACHEAL ASPIRATE  Final   Special Requests NONE  Final   Gram Stain   Final    RARE WBC  PRESENT, PREDOMINANTLY PMN RARE GRAM POSITIVE RODS    Culture   Final    FEW Consistent with normal respiratory flora. Performed at [facility], [address] [place], [place] [address]    Report Status 10/28/2018 FINAL  Final    Lab Basic Metabolic Panel: Recent Labs  Lab 29-Oct-2018 1719  10/25/18 0507  10/25/18 2042 10/25/18 2300 10/26/18 0445 10/26/18 0449 10/26/18 1222 11/16/2018 0032  NA  --    < > 139   < > 136 136 138 140 142 142  K  --    < > 3.4*   < > 4.8 6.4* 5.8* 5.9* 5.9* 5.3*  CL  --    < > 108   < > 107 106  --  109 104 106  CO2  --    < > 15*   < > 17* 14*  --  10* 14* 19*  GLUCOSE  --    < > 175*   < > 166* 171*  --  136* 130* 104*  BUN  --    < > 23   < > 27* 29*  --  31* 37* 46*  CREATININE  --    < > 2.07*   < > 3.04* 3.17*  --  3.52* 4.07* 5.14*  CALCIUM  --    < > 8.3*   < > 7.6* 7.7*  --  7.7* 7.4* 7.3*  MG 2.0  --  1.8  --   --   --   --  2.0  --   --   PHOS  --   --  3.0  --   --   --   --  7.2*  --   --    < > = values in this interval not displayed.   Liver Function Tests: Recent Labs  Lab 10/29/18 1318  AST 127*  ALT 56*  ALKPHOS 88  BILITOT 0.8  PROT 5.9*  ALBUMIN 2.7*   No results for input(s): LIPASE, AMYLASE in the last 168 hours. No results for input(s): AMMONIA in the last 168 hours. CBC: Recent Labs  Lab 10-29-18 1318  10/25/18 0325 10/25/18 0507 10/25/18 0512 10/26/18 0445 10/26/18 0449  WBC 10.3  --   --  20.7*  --   --  18.3*  NEUTROABS 3.5  --   --   --   --   --   --   HGB 12.9   < > 13.3 12.8 12.2 11.9* 11.9*  HCT 41.3   < > 39.0 38.5 36.0 35.0* 36.6  MCV 118.3*  --   --  108.8*  --   --  113.7*  PLT 157  --   --  209  --   --  194   < > = values in this interval not displayed.   Cardiac Enzymes: Recent Labs  Lab 2018-10-29 1719 2018/10/29 1959 10/25/18 0208 10/25/18 0824  TROPONINI 18.31* 30.19* 28.47* 22.82*   Sepsis Labs: Recent Labs  Lab 10/29/18 1318 10-29-2018 2114 10/25/18 0507 10/26/18 0449   PROCALCITON 0.10 56.34 84.70 118.05  WBC 10.3  --  20.7* 18.3*  LATICACIDVEN >11.0* 7.3* 6.3*  --     Procedures/Operations  ETT 6/5 > CVL 6/5 >  Dawn Cruz 10/29/2018, 10:56 AM

## 2018-11-19 NOTE — Progress Notes (Signed)
Family here to see patient. Dr. Valeta Harms spoke to them. Chaplain requested and paged. Also paged Pall[facility]. Will be withdrawing care.

## 2018-11-19 NOTE — Progress Notes (Signed)
NAME:  Dawn Cruz, MRN:  [id number], DOB:  October 31, 1950, LOS: 3 ADMISSION DATE:  10/22/2018, CONSULTATION DATE:  6/5  REFERRING MD: Dr. Adela LankFloyd EDP, CHIEF COMPLAINT:   Cardiac arrest  Brief History   68 year old female witnessed VF arrest with quick bystander CPR. 30-45 minute duration off and on.   History of present illness    68 year old female with prior history of prior cardiac arrest, MI, and HFrEF 10% in 2013. No other history available in  EMR. She is supposed to be on HCTZ, Norvasc, and toprol, however she last filled these 4 months ago for 90 days worth. Her PCP felt as though she had not been compliant. She smokes 3 cigarettes per day and drinks two fifths of liquor per week. She was at work in her usual state of health when she collapsed in the early PM hours of 6/5. Collapse was witnessed and EMS was called right away. Reports of bystander CPR. EMS responded in approximately 5 mins. Initial rhythm VF. Patient underwent ACLS with defibrillation for entire trip to ED. Continued to be in VF for first 30 mins in ED with brief periods of ROSC. She was intubated, CVL placed, and treated with amiodarone and norepinephrine infusions in addition to ACLS with multiple defibrillations. Once ROSC achieved with relative stability, PCCM has been asked to admit.  Past Medical History   has a past medical history of Acute MI (HCC), Cardiomyopathy, severe by echo with EF 10-20% (09/11/2011), and MR (mitral regurgitation), moderate (09/11/2011).   Significant Hospital Events   6/5 admit for cardiac arrest  Consults:  Cardiology  Procedures:  ETT 6/5 > CVL 6/5 >  Significant Diagnostic Tests:  CT head 6/5 > Echo 6/5 > EEG 6/5 >  Micro Data:  COVID-19 5/6 > negative  Blood Cx 6/5 > Urine 6/5 > Tracheal aspirate 6/5 >  Antimicrobials:  Unasyn 6/5 >  Interim history/subjective:  Plans for palliative withdrawal today, comfort care measures.  Comfort is the only goal.  Objective    Blood pressure (!) 115/59, pulse (!) 111, temperature (!) 102.6 F (39.2 C), resp. rate 20, height 5\' 4"  (1.626 m), weight 68 kg, SpO2 95 %. CVP:  [31 mmHg] 31 mmHg  Vent Mode: PRVC FiO2 (%):  [40 %] 40 % Set Rate:  [18 bmp] 18 bmp Vt Set:  [440 mL] 440 mL PEEP:  [8 cmH20] 8 cmH20 Plateau Pressure:  [16 cmH20-19 cmH20] 16 cmH20   Intake/Output Summary (Last 24 hours) at 10/22/2018 1001 Last data filed at 11/11/2018 0700 Gross per 24 hour  Intake 1475.31 ml  Output 115 ml  Net 1360.31 ml   Filed Weights   11/09/2018 1253  Weight: 68 kg    Examination: General: Elderly female intubated sedated on mechanical ventilation HENT: Tracheal tube in place Lungs: Bilateral ventilated breath sounds Cardiovascular: Sinus tachycardia on telemetry  Resolved Hospital Problem list     Assessment & Plan:   Cardiac arrest: VF arrest, prolonged downtime.  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 We will plan for palliative extubation and liberation from the mechanical ventilator and life support. Comfort is the only goal.  Sedation comfort medications to include morphine, fentanyl and  Versed. I suspect she will pass quickly with liberation from mechanical support as well as vasopressor support in her advanced cardiogenic shock state and multiorgan failure.  Best practice:  Diet: NPO Pain/Anxiety/Delirium protocol (if indicated): Fent/Versed. RASS -5 VAP protocol (if indicated): Per protocl DVT prophylaxis: SQH GI prophylaxis: pepcid Glucose control: SSI Mobility: BR Code Status: FULL Family Communication: Family at  bedside Disposition: Comfort care measures  Labs   CBC: Recent Labs  Lab 10/31/2018 1318  10/25/18 0325 10/25/18 0507 10/25/18 0512 10/26/18 0445 10/26/18 0449  WBC 10.3  --   --  20.7*  --   --  18.3*  NEUTROABS 3.5  --   --   --   --   --   --   HGB 12.9   < > 13.3 12.8 12.2 11.9* 11.9*  HCT 41.3   < > 39.0 38.5 36.0 35.0* 36.6  MCV 118.3*  --   --  108.8*  --   --  113.7*  PLT 157  --   --  209  --   --  194   < > = values in this interval not displayed.    Basic Metabolic Panel: Recent Labs  Lab 11/12/2018 1719  10/25/18 0507  10/25/18 2042 10/25/18 2300 10/26/18 0445 10/26/18 0449 10/26/18 1222 11/04/2018 0032  NA  --    < > 139   < > 136 136 138 140 142 142  K  --    < > 3.4*   < > 4.8 6.4* 5.8* 5.9* 5.9* 5.3*  CL  --    < > 108   < > 107 106  --  109 104 106  CO2  --    < > 15*   < > 17* 14*  --  10* 14* 19*  GLUCOSE  --    < > 175*   < > 166* 171*  --  136* 130* 104*  BUN  --    < > 23   < > 27* 29*  --  31* 37* 46*  CREATININE  --    < > 2.07*   < > 3.04* 3.17*  --  3.52* 4.07* 5.14*  CALCIUM  --    < > 8.3*   < > 7.6* 7.7*  --  7.7* 7.4* 7.3*  MG 2.0  --  1.8  --   --   --   --  2.0  --   --   PHOS  --   --  3.0  --   --   --   --  7.2*  --   --    < > = values in this interval not displayed.   GFR: Estimated Creatinine Clearance: 10.2 mL/min (A) (by C-G formula based on SCr of 5.14 mg/dL (H)). Recent Labs  Lab 10/31/2018 1318 10/26/2018 2114 10/25/18 0507 10/26/18 0449  PROCALCITON 0.10 56.34 84.70 118.05  WBC 10.3  --  20.7* 18.3*  LATICACIDVEN >11.0* 7.3* 6.3*  --     Liver Function Tests: Recent Labs  Lab 11/12/2018 1318  AST 127*  ALT 56*  ALKPHOS 88  BILITOT 0.8  PROT 5.9*  ALBUMIN 2.7*   No results for input(s): LIPASE, AMYLASE in the last 168 hours. No results for input(s): AMMONIA in the last 168 hours.  ABG    Component Value Date/Time   PHART 7.164 (LL) 10/26/2018 0445   PCO2ART 24.5 (L) 10/26/2018 0445   PO2ART 88.0 10/26/2018  0445  HCO3 9.1 (L) 10/26/2018 0445   TCO2 10 (L) 10/26/2018 0445   ACIDBASEDEF 19.0 (H) 10/26/2018 0445   O2SAT 95.0 10/26/2018 0445     Coagulation Profile: Recent Labs  Lab 11/01/2018 1719 10/26/2018 2114 10/25/18 0507  INR 1.4* 1.3* 1.2    Cardiac Enzymes: Recent Labs  Lab 11/03/2018 1719 11/04/2018 1959 10/25/18 0208 10/25/18 0824  TROPONINI 18.31* 30.19* 28.47* 22.82*    HbA1C: Hemoglobin A1C  Date/Time Value Ref Range Status  02/13/2017 03:18 PM 5.0  Final  08/11/2013 10:38 AM 5.5  Final   Hgb A1c MFr Bld  Date/Time Value Ref Range Status  11/15/2018 09:14 PM 4.9 4.8 - 5.6 % Final    Comment:    (NOTE) Pre diabetes:          5.7%-6.4% Diabetes:              >6.4% Glycemic control for   <7.0% adults with diabetes     CBG: Recent Labs  Lab 10/26/18 1514 10/26/18 2014 11/18/2018 0033 10/31/2018 0425 10/22/2018 0739  GLUCAP 99 99 108* 111* 87    Review of Systems:   Unable: intubated  Past Medical History  She,  has a past medical history of Acute MI ([place]), Cardiomyopathy, severe by echo with EF 10-20% (09/11/2011), and MR (mitral regurgitation), moderate (09/11/2011).   Surgical History   No past surgical history on file.   Social History   reports that she has been smoking cigarettes. She has a 3.75 pack-year smoking history. She has never used smokeless tobacco. She reports current alcohol use of about 9.0 standard drinks of alcohol per week. She reports current drug use. Frequency: 1.00 time per week. Drug: Cocaine.   Family History   Her family history is not on file.   Allergies No Known Allergies   Home Medications  Prior to Admission medications   Medication Sig Start Date End Date Taking? Authorizing Provider  amLODipine (NORVASC) 5 MG tablet Take 1 tablet (5 mg total) by mouth daily. 05/22/18   Clent Demark, PA-C  aspirin 81 MG tablet Take 1 tablet (81 mg total) by mouth daily. 02/13/17   Clent Demark, PA-C  atorvastatin (LIPITOR) 40  MG tablet Take 2 tablets (80 mg total) by mouth daily. 05/28/18   Clent Demark, PA-C  fluticasone [facility]) 50 MCG/ACT nasal spray Place 2 sprays into both nostrils daily. 07/01/18   Tasia Catchings, Amy V, PA-C  hydrochlorothiazide (HYDRODIURIL) 25 MG tablet Take 1 tablet (25 mg total) by mouth daily. 05/22/18   Clent Demark, PA-C  ipratropium (ATROVENT) 0.06 % nasal spray Place 2 sprays into both nostrils 4 (four) times daily. 07/01/18   Tasia Catchings, Amy V, PA-C  metoprolol succinate (TOPROL XL) 25 MG 24 hr tablet Take 1 tablet (25 mg total) by mouth daily. 05/22/18   Clent Demark, PA-C  ondansetron (ZOFRAN ODT) 4 MG disintegrating tablet Take 1 tablet (4 mg total) by mouth every 8 (eight) hours as needed for nausea or vomiting. 07/01/18   Tasia Catchings, Amy V, PA-C  PAZEO 0.7 % SOLN Place 0.7 Bottles into both eyes every morning. 10/12/17   [provider]  varenicline (CHANTIX STARTING MONTH PAK) 0.5 MG X 11 & 1 MG X 42 tablet Take one 0.5 mg tablet by mouth once daily for 3 days, then increase to one 0.5 mg tablet twice daily for 4 days, then increase to one 1 mg tablet twice daily. 05/22/18   Clent Demark, PA-C  Josephine IgoBradley L Kalieb Freeland, DO [facility] 10/30/2018 10:01 AM  Personal pager: [phone] If unanswered, please page CCM On-call: [phone]

## 2018-11-19 NOTE — Progress Notes (Signed)
Chaplain in to see patient.

## 2018-11-19 NOTE — Progress Notes (Signed)
Nutrition Brief Note  Chart reviewed. Pt now transitioning to comfort care.  No further nutrition interventions warranted at this time.  Please consult as needed.  Kate Jablonski Rania Prothero, MS, RD, LDN Inpatient Clinical Dietitian Pager: [phone] Weekend/After Hours: [phone]  

## 2018-11-19 NOTE — Progress Notes (Signed)
Patient expired at 72 with family attendance 2  Sons.  Pronounced by 2 people myself and Robinette Haines. [place] donor services called with time of death.

## 2018-11-19 NOTE — Progress Notes (Signed)
Sedation discontinued.  Wasted: 220cc fentanyl, 40cc versed  Location: waste to be incinerated Astronomer)  Witnessed by: Robinette Haines, RN

## 2018-11-19 NOTE — Procedures (Signed)
Extubation Procedure Note  Patient Details:   Name: Dawn Cruz DOB: [birth year] MRN: [id number]   Airway Documentation:    Vent end date: 11-04-2018 Vent end time: 1010   Evaluation  O2 sats: transiently fell during during procedure Complications: No apparent complications Patient did tolerate procedure well. Bilateral Breath Sounds: Rhonchi, Diminished   Withdrawal of care per MD order & family request.  Kathie Dike 11-04-18, 10:14 AM

## 2018-11-19 NOTE — Progress Notes (Signed)
   11/18/2018 1000  Clinical Encounter Type  Visited With Patient and family together;Health care provider  Visit Type Initial;Spiritual support;Patient actively dying  Referral From Nurse;Family  Spiritual Encounters  Spiritual Needs Prayer;Emotional;Grief support  Stress Factors  Patient Stress Factors Not reviewed  Family Stress Factors Loss of control;Major life changes   Responded to chaplain request via RN, from family.  Two sons, Legrand Como and Sonia Side, present at bedside.  Empathetic listening, grief support.  Listened about pt's love for people and her care-taking of her plants and flowers.  Sonia Side did express that pt had told him that she didn't want to be on machines, wanted to die quickly and peacefully.  Prayed w/ pt and sons at sons' request.  Ck'd in w/ dr and RN before and after visit.  Will return when pt removed from life-supporting measures.  Myra Gianotti resident, [phone]

## 2018-11-19 NOTE — Progress Notes (Signed)
   11/07/2018 1300  Clinical Encounter Type  Visited With Patient and family together;Health care provider  Visit Type Follow-up;Spiritual support;Patient actively dying  Referral From Family  Spiritual Encounters  Spiritual Needs Prayer;Emotional;Grief support  Stress Factors  Patient Stress Factors Not reviewed  Family Stress Factors Loss of control;Loss;Major life changes   Summoned by RN and Dr when pt had been extubated.  Present w/ sons at bedside, prayed again w/ them.  Supported grief and Occupational psychologist.  Returned to b/u just past 11am; pt had just passed.  Attempted to f/u w/ sons, but they left and were not in a place where they were up to talking w/ anyone.    Myra Gianotti resident, [phone]

## 2018-11-19 DEATH — deceased

## 2019-10-23 IMAGING — CT CT HEAD WITHOUT CONTRAST
4 series · 16 of 47 positions shown, 18 images · non-contrast
Comparison: 09/11/2011

CLINICAL DATA: Collapse, cardiac arrest

EXAM:
CT HEAD WITHOUT CONTRAST
TECHNIQUE: Contiguous axial images were obtained from the base of the skull
through the vertex without intravenous contrast.

[Series 2: head wo · axial · 0.43mm/px · z∈[+1287,+1407]mm · 7 of 34 slices shown, 9 images]
[im 5/34  brain]
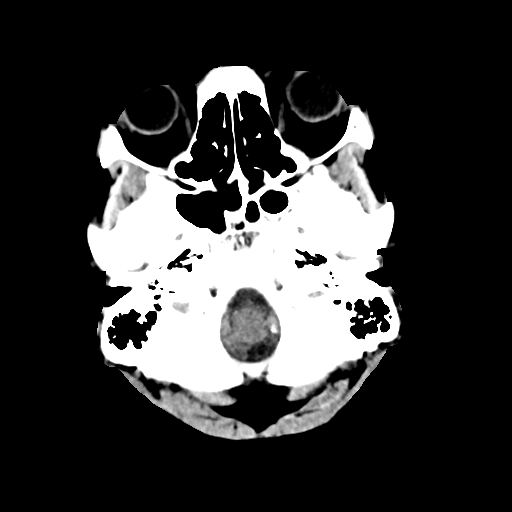
[im 5/34  bone]
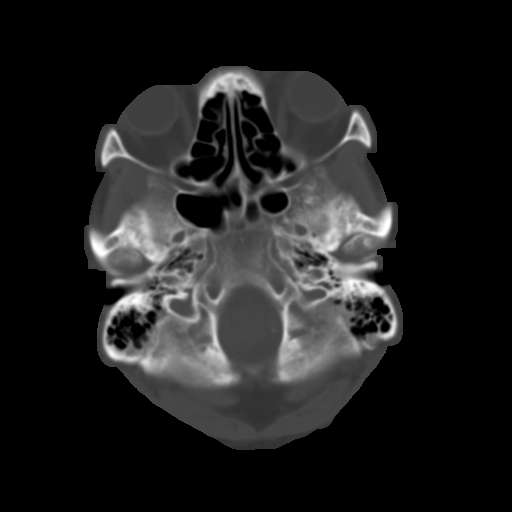
[im 9/34  brain]
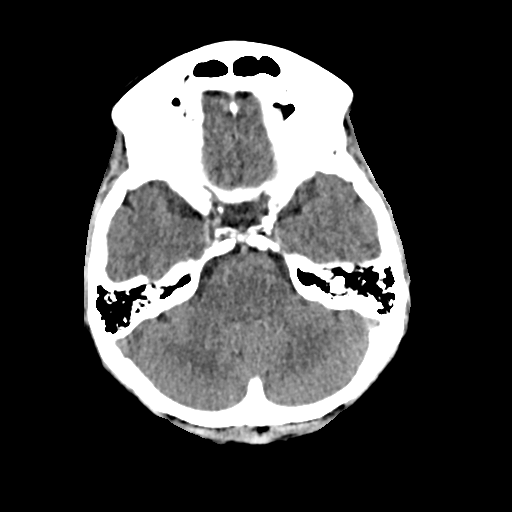
[im 13/34  brain]
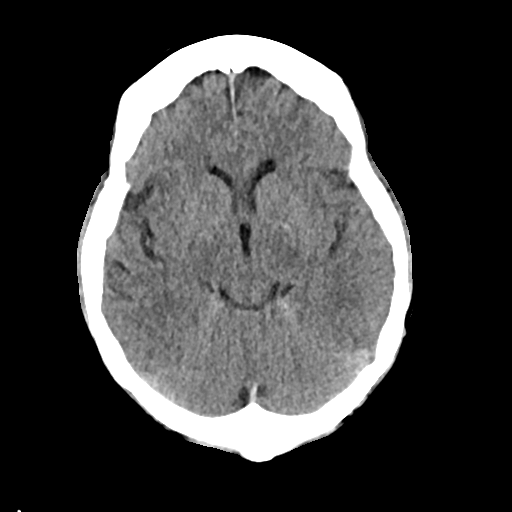
[im 17/34  brain]
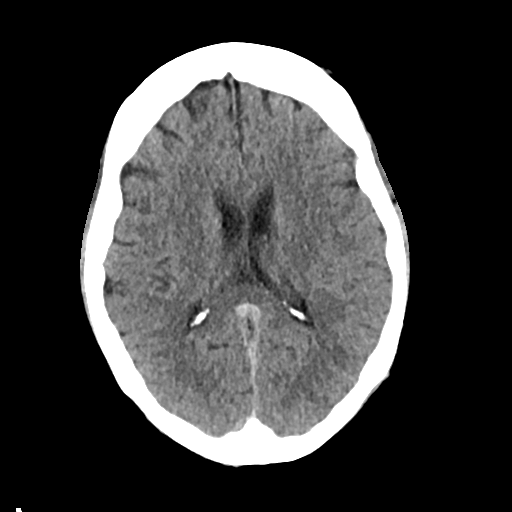
[im 21/34  brain]
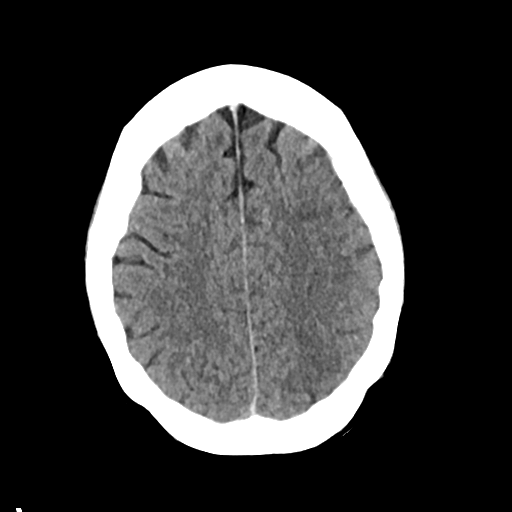
[im 21/34  bone]
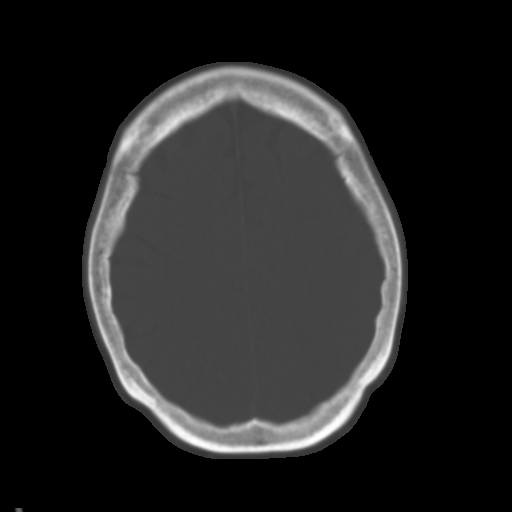
[im 25/34  brain]
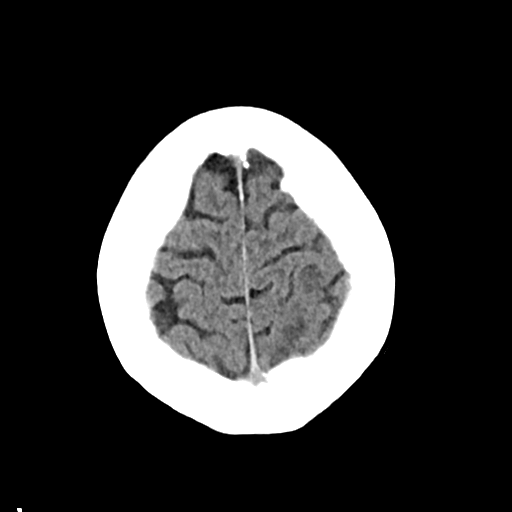
[im 29/34  brain]
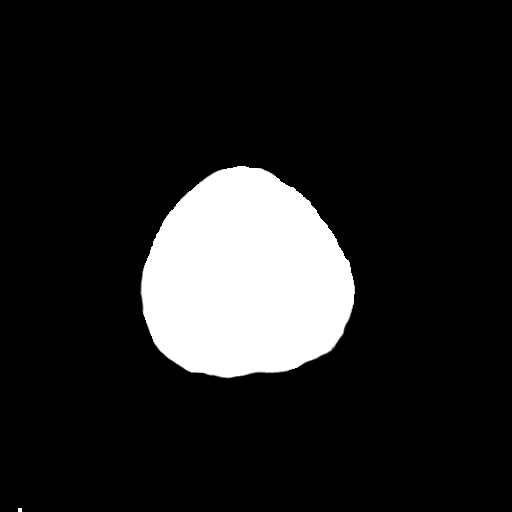

[Series 3: head bone · axial · 0.43mm/px · z∈[+1283,+1315]mm · 3 of 84 slices shown]
[im 9/84  bone]
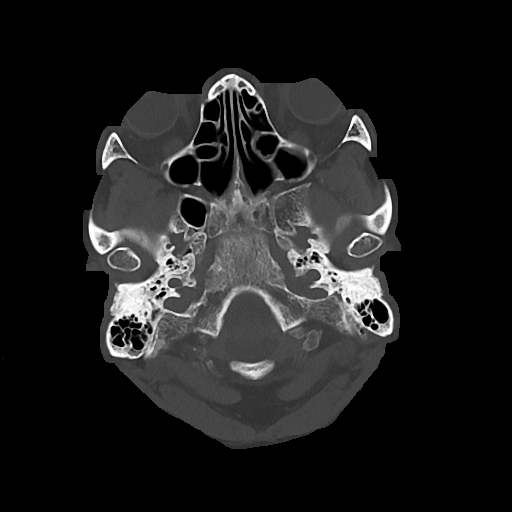
[im 17/84  bone]
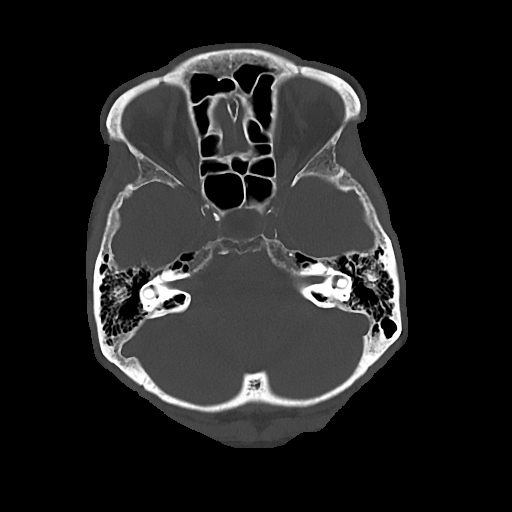
[im 25/84  bone]
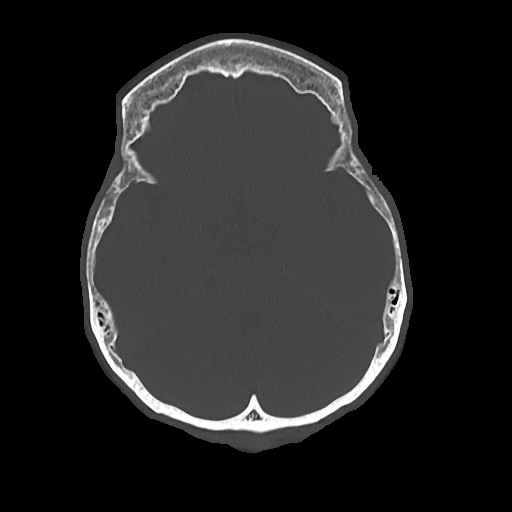

[Series 4: cor soft · coronal · 0.34mm/px · 3 of 71 slices shown]
[im 24/71  brain]
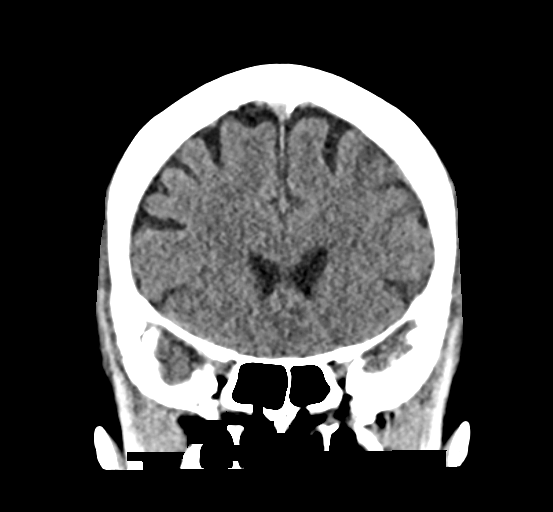
[im 32/71  brain]
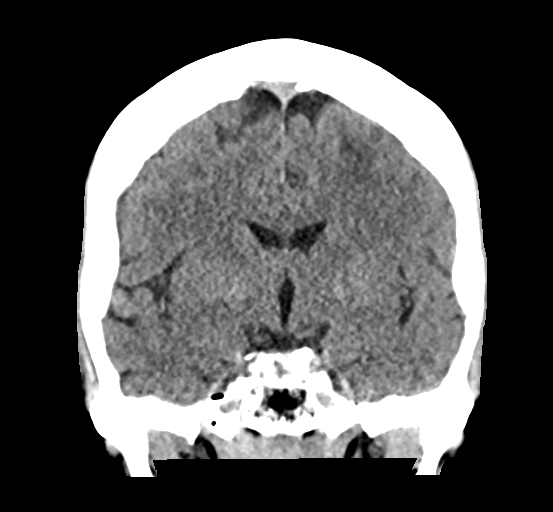
[im 39/71  brain]
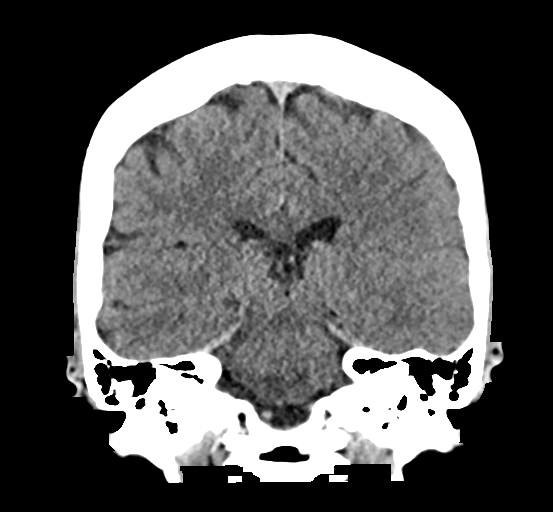

[Series 5: sag soft · sagittal · 0.33mm/px · 3 of 67 slices shown]
[im 23/67  brain]
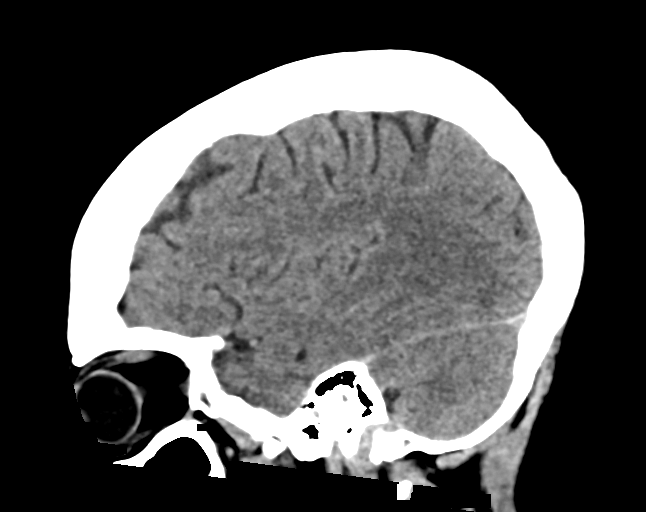
[im 34/67  brain]
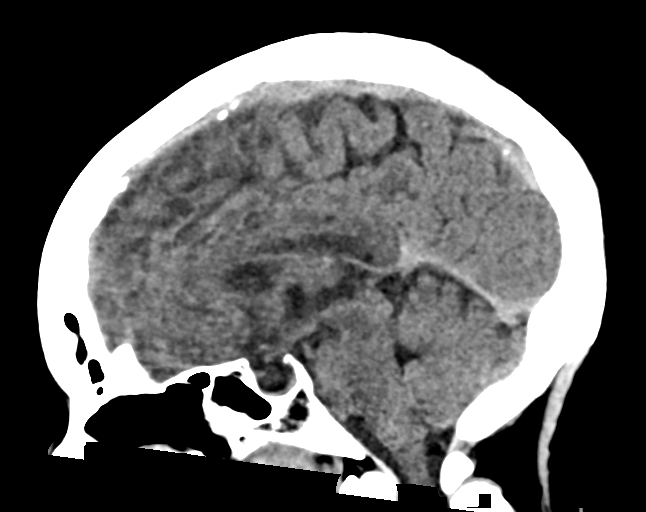
[im 45/67  brain]
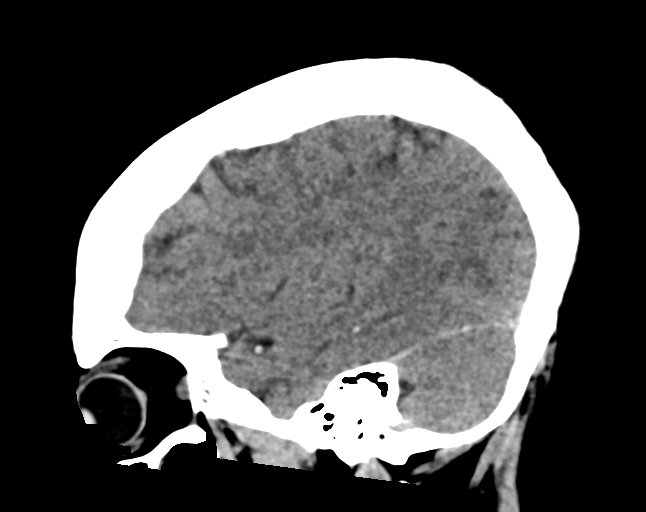

[16 of 47 positions shown; findings below may reference images not displayed]

FINDINGS: Brain: There is subtle hypodensity and loss of gray-white
distinction of the left MCA territory (series 2, image 21, series 4,
image 32).

Vascular: No hyperdense vessel or unexpected calcification.

Skull: Normal. Negative for fracture or focal lesion.

Sinuses/Orbits: No acute finding.

Other: None.
IMPRESSION: There is subtle hypodensity and loss of gray-white distinction of
the left MCA territory (series 2, image 21, series 4, image 32),
concerning for large left MCA territory infarction. Consider MRI to
more sensitively evaluate for acute diffusion restricting
infarction. No intracranial hemorrhage.

## 2019-10-25 IMAGING — DX PORTABLE CHEST - 1 VIEW
1 series · 1 of 1 positions shown · non-contrast
Comparison: Chest radiograph 10/24/2018

CLINICAL DATA: OG tube placement.

EXAM:
PORTABLE CHEST 1 VIEW

[chest ap]
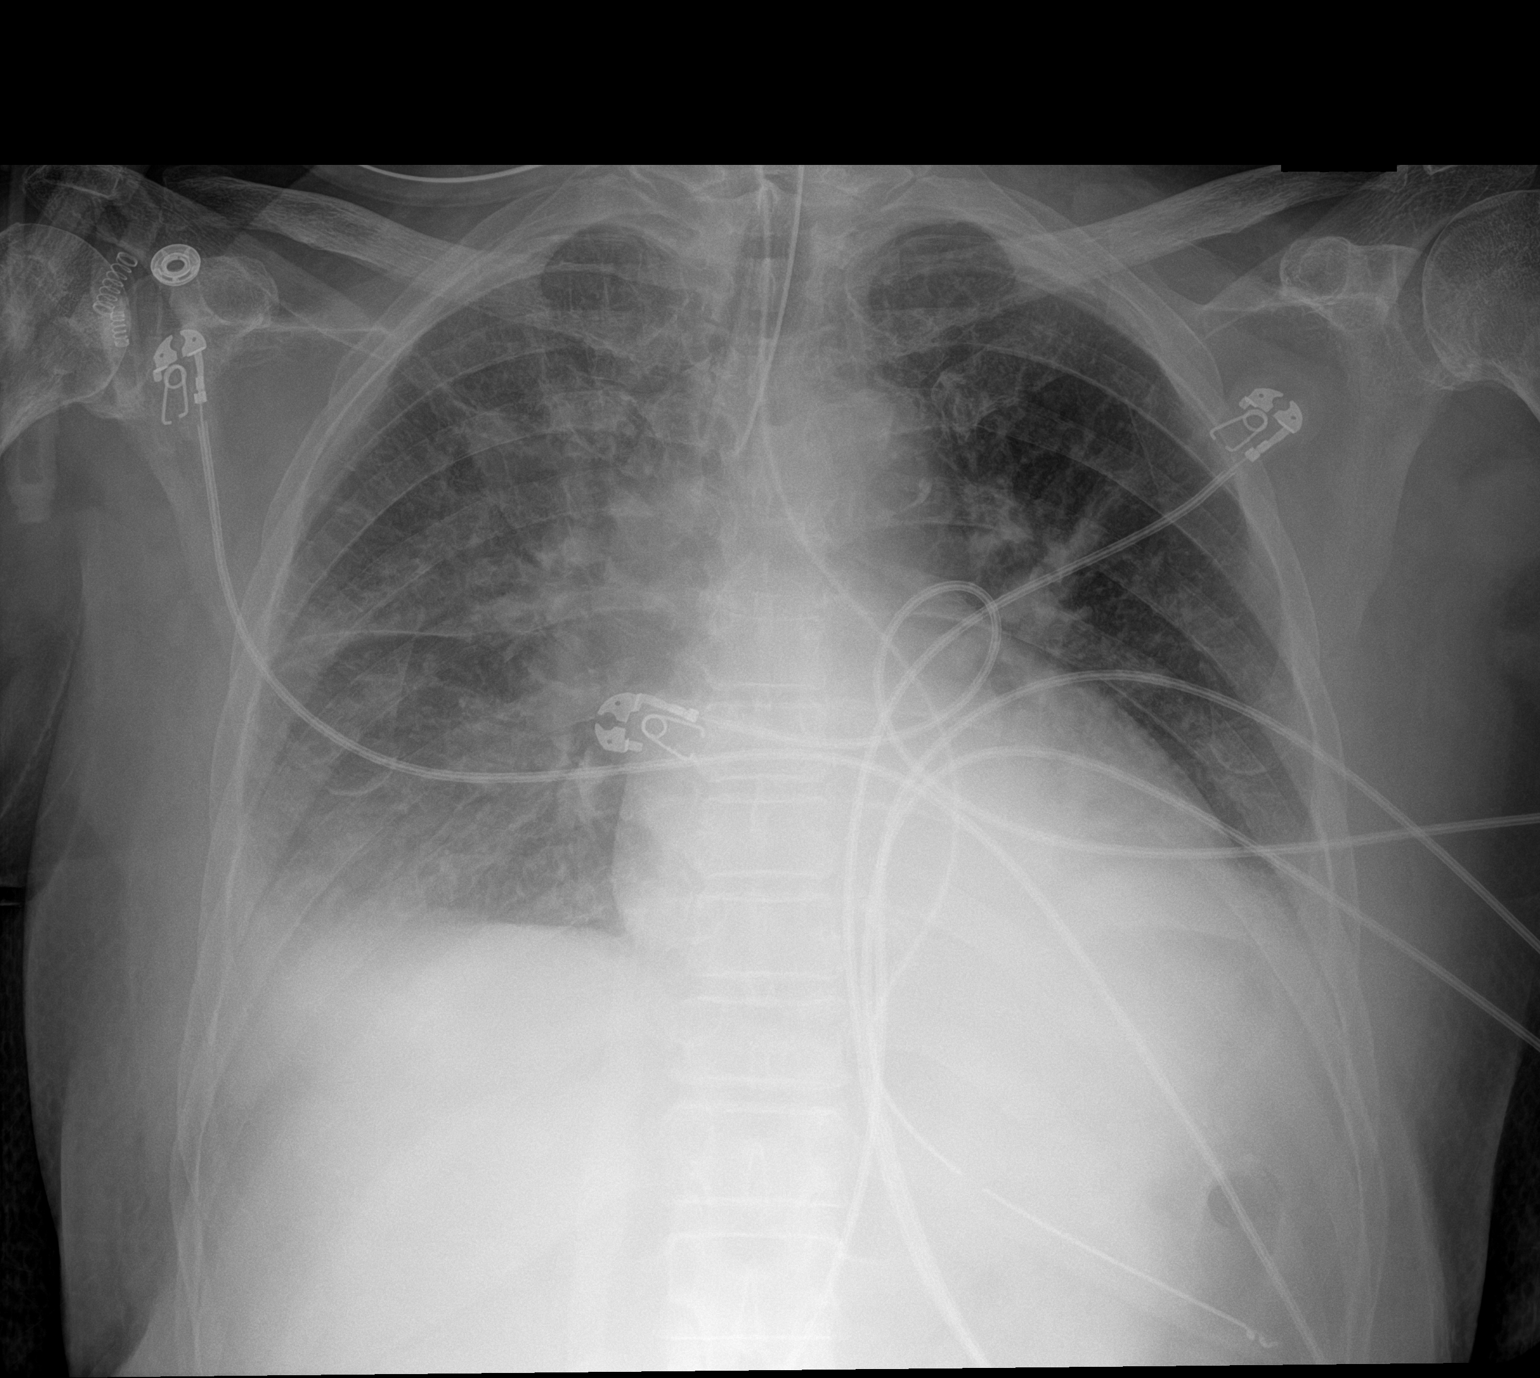

[1 of 1 positions shown; findings below may reference images not displayed]

FINDINGS: ET tube mid trachea. Enteric tube projects over the stomach.
Monitoring leads overlie the patient. Stable cardiomegaly. Low lung
volumes. Interval increase in opacities throughout the right lung.
Left basilar atelectasis. Possible small bilateral pleural
effusions.
IMPRESSION: Increasing opacities throughout the right lung may represent
infection or asymmetric edema.

Small bilateral pleural effusions.

## 2019-10-25 IMAGING — DX ABDOMEN - 1 VIEW
1 series · 1 of 1 positions shown · non-contrast
Comparison: None.

CLINICAL DATA: Interval placement enteric tube

EXAM:
ABDOMEN - 1 VIEW

[abdomen kub]
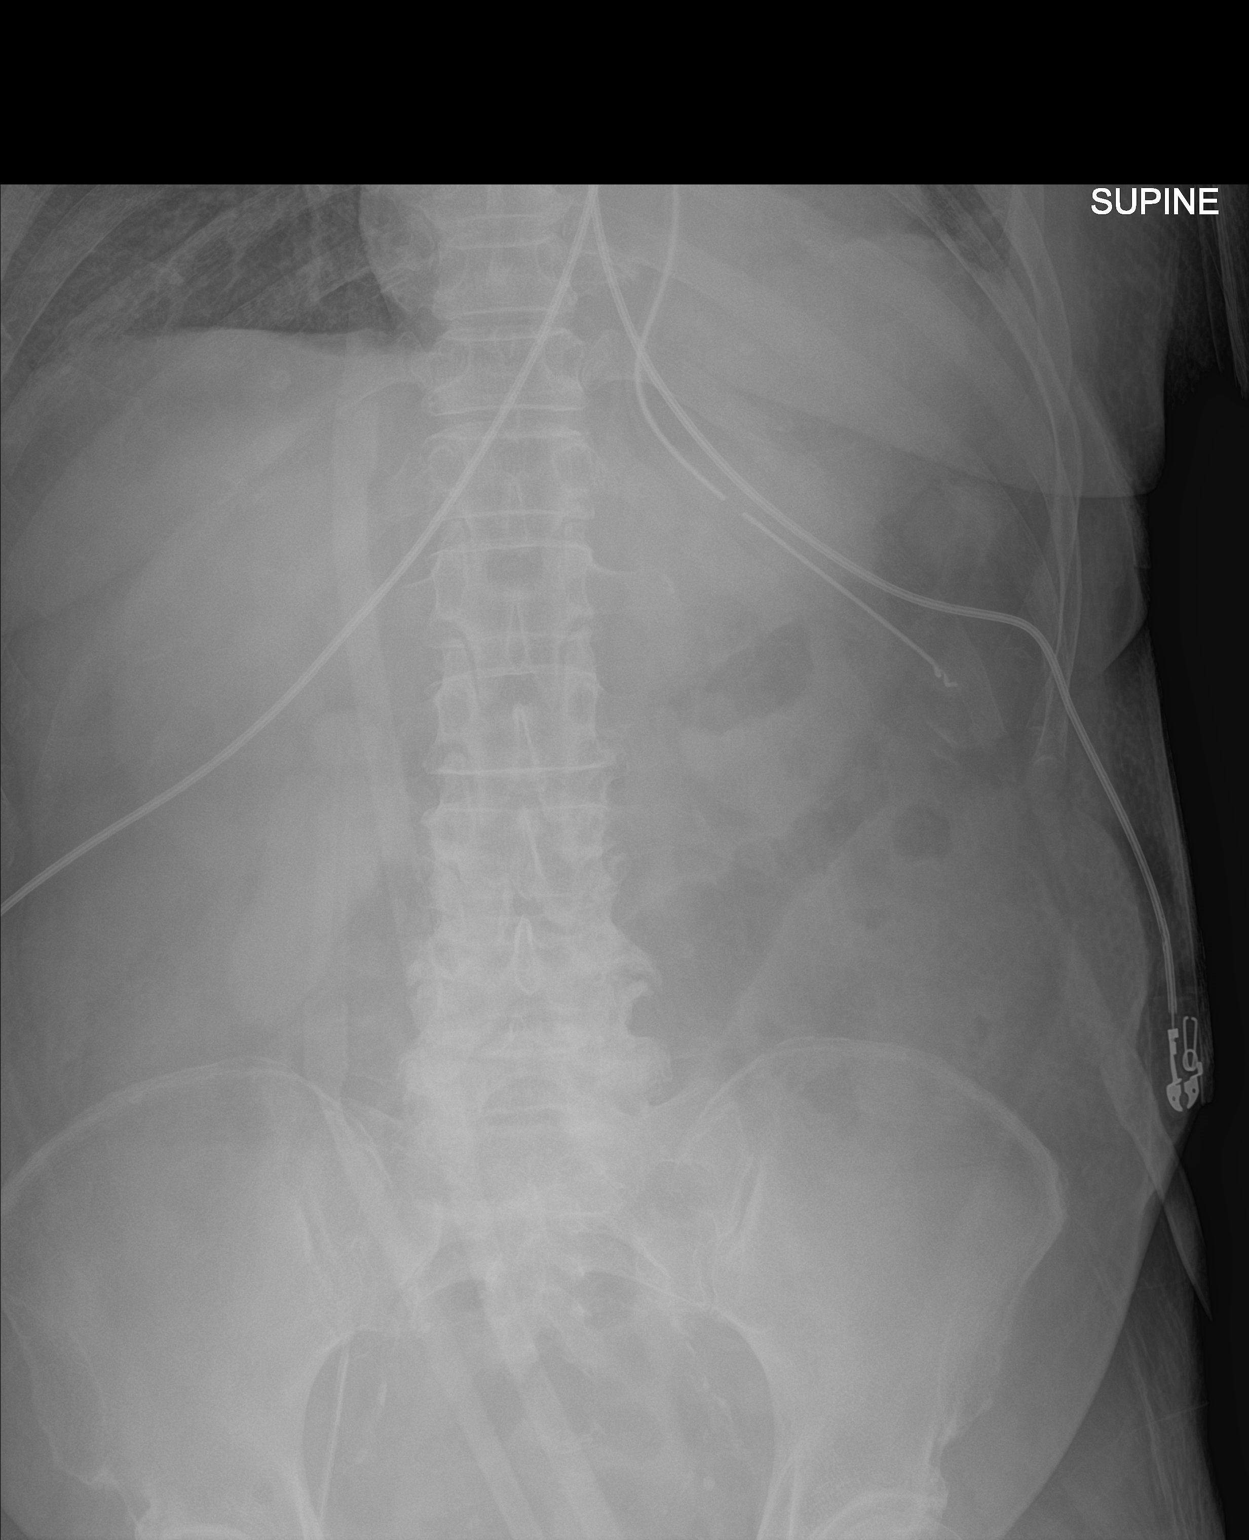

[1 of 1 positions shown; findings below may reference images not displayed]

FINDINGS: Enteric tube tip and side-port project over the stomach. Paucity of
bowel gas. Lumbar spine degenerative changes. Vascular
calcifications.
IMPRESSION: Enteric tube projects over the stomach.
